# Patient Record
Sex: Male | Born: 1935 | Race: White | Hispanic: No | Smoking: Former smoker
Health system: Southern US, Community
[De-identification: ages and names within clinical notes are randomized; demographics above are authoritative.]

## PROBLEM LIST (undated history)

## (undated) DIAGNOSIS — I4891 Unspecified atrial fibrillation: Secondary | ICD-10-CM

## (undated) DIAGNOSIS — Z87891 Personal history of nicotine dependence: Secondary | ICD-10-CM

## (undated) DIAGNOSIS — F32A Depression, unspecified: Secondary | ICD-10-CM

## (undated) DIAGNOSIS — F0390 Unspecified dementia without behavioral disturbance: Secondary | ICD-10-CM

## (undated) DIAGNOSIS — Z87768 Personal history of other specified (corrected) congenital malformations of integument, limbs and musculoskeletal system: Secondary | ICD-10-CM

## (undated) DIAGNOSIS — F329 Major depressive disorder, single episode, unspecified: Secondary | ICD-10-CM

## (undated) DIAGNOSIS — Z8776 Personal history of (corrected) congenital malformations of integument, limbs and musculoskeletal system: Secondary | ICD-10-CM

## (undated) DIAGNOSIS — N400 Enlarged prostate without lower urinary tract symptoms: Secondary | ICD-10-CM

## (undated) DIAGNOSIS — Z66 Do not resuscitate: Secondary | ICD-10-CM

## (undated) DIAGNOSIS — I2510 Atherosclerotic heart disease of native coronary artery without angina pectoris: Secondary | ICD-10-CM

## (undated) DIAGNOSIS — E538 Deficiency of other specified B group vitamins: Secondary | ICD-10-CM

## (undated) DIAGNOSIS — S12100A Unspecified displaced fracture of second cervical vertebra, initial encounter for closed fracture: Secondary | ICD-10-CM

## (undated) DIAGNOSIS — I10 Essential (primary) hypertension: Secondary | ICD-10-CM

## (undated) DIAGNOSIS — I619 Nontraumatic intracerebral hemorrhage, unspecified: Secondary | ICD-10-CM

## (undated) HISTORY — DX: Personal history of (corrected) congenital malformations of integument, limbs and musculoskeletal system: Z87.76

## (undated) HISTORY — DX: Personal history of other specified (corrected) congenital malformations of integument, limbs and musculoskeletal system: Z87.768

## (undated) HISTORY — PX: CORONARY ARTERY BYPASS GRAFT: SHX141

---

## 2003-04-03 ENCOUNTER — Other Ambulatory Visit: Payer: Self-pay

## 2005-06-05 ENCOUNTER — Other Ambulatory Visit: Payer: Self-pay

## 2005-06-05 ENCOUNTER — Inpatient Hospital Stay: Payer: Self-pay | Admitting: Internal Medicine

## 2005-06-06 ENCOUNTER — Other Ambulatory Visit: Payer: Self-pay

## 2005-07-31 ENCOUNTER — Encounter: Payer: Self-pay | Admitting: Cardiovascular Disease

## 2005-08-26 ENCOUNTER — Encounter: Payer: Self-pay | Admitting: Cardiovascular Disease

## 2007-05-01 ENCOUNTER — Ambulatory Visit: Payer: Self-pay | Admitting: Cardiovascular Disease

## 2007-10-28 ENCOUNTER — Inpatient Hospital Stay: Payer: Self-pay | Admitting: Psychiatry

## 2007-10-28 ENCOUNTER — Other Ambulatory Visit: Payer: Self-pay

## 2007-11-27 ENCOUNTER — Inpatient Hospital Stay: Payer: Self-pay | Admitting: Psychiatry

## 2007-12-09 ENCOUNTER — Inpatient Hospital Stay: Payer: Self-pay | Admitting: Psychiatry

## 2007-12-18 ENCOUNTER — Other Ambulatory Visit: Payer: Self-pay

## 2008-06-13 ENCOUNTER — Inpatient Hospital Stay: Payer: Self-pay | Admitting: Internal Medicine

## 2009-04-28 ENCOUNTER — Emergency Department: Payer: Self-pay | Admitting: Internal Medicine

## 2011-09-05 ENCOUNTER — Inpatient Hospital Stay (HOSPITAL_COMMUNITY): Payer: Medicare Other

## 2011-09-05 ENCOUNTER — Encounter (HOSPITAL_COMMUNITY): Payer: Self-pay | Admitting: Emergency Medicine

## 2011-09-05 ENCOUNTER — Inpatient Hospital Stay (HOSPITAL_COMMUNITY)
Admission: EM | Admit: 2011-09-05 | Discharge: 2011-09-07 | DRG: 123 | Payer: Medicare Other | Attending: Internal Medicine | Admitting: Internal Medicine

## 2011-09-05 ENCOUNTER — Other Ambulatory Visit (HOSPITAL_COMMUNITY): Payer: Medicare Other

## 2011-09-05 DIAGNOSIS — I2510 Atherosclerotic heart disease of native coronary artery without angina pectoris: Secondary | ICD-10-CM | POA: Diagnosis present

## 2011-09-05 DIAGNOSIS — G459 Transient cerebral ischemic attack, unspecified: Secondary | ICD-10-CM | POA: Diagnosis present

## 2011-09-05 DIAGNOSIS — H3400 Transient retinal artery occlusion, unspecified eye: Principal | ICD-10-CM | POA: Diagnosis present

## 2011-09-05 DIAGNOSIS — F0390 Unspecified dementia without behavioral disturbance: Secondary | ICD-10-CM | POA: Diagnosis present

## 2011-09-05 DIAGNOSIS — Z66 Do not resuscitate: Secondary | ICD-10-CM | POA: Diagnosis present

## 2011-09-05 DIAGNOSIS — R55 Syncope and collapse: Secondary | ICD-10-CM | POA: Diagnosis present

## 2011-09-05 DIAGNOSIS — I4891 Unspecified atrial fibrillation: Secondary | ICD-10-CM | POA: Diagnosis present

## 2011-09-05 DIAGNOSIS — Z951 Presence of aortocoronary bypass graft: Secondary | ICD-10-CM

## 2011-09-05 DIAGNOSIS — R531 Weakness: Secondary | ICD-10-CM

## 2011-09-05 DIAGNOSIS — R1310 Dysphagia, unspecified: Secondary | ICD-10-CM | POA: Diagnosis not present

## 2011-09-05 DIAGNOSIS — N400 Enlarged prostate without lower urinary tract symptoms: Secondary | ICD-10-CM | POA: Diagnosis present

## 2011-09-05 DIAGNOSIS — E119 Type 2 diabetes mellitus without complications: Secondary | ICD-10-CM | POA: Diagnosis present

## 2011-09-05 DIAGNOSIS — I10 Essential (primary) hypertension: Secondary | ICD-10-CM | POA: Diagnosis present

## 2011-09-05 HISTORY — DX: Major depressive disorder, single episode, unspecified: F32.9

## 2011-09-05 HISTORY — DX: Unspecified atrial fibrillation: I48.91

## 2011-09-05 HISTORY — DX: Atherosclerotic heart disease of native coronary artery without angina pectoris: I25.10

## 2011-09-05 HISTORY — DX: Essential (primary) hypertension: I10

## 2011-09-05 HISTORY — DX: Unspecified dementia, unspecified severity, without behavioral disturbance, psychotic disturbance, mood disturbance, and anxiety: F03.90

## 2011-09-05 HISTORY — DX: Personal history of nicotine dependence: Z87.891

## 2011-09-05 HISTORY — DX: Unspecified displaced fracture of second cervical vertebra, initial encounter for closed fracture: S12.100A

## 2011-09-05 HISTORY — DX: Nontraumatic intracerebral hemorrhage, unspecified: I61.9

## 2011-09-05 HISTORY — DX: Depression, unspecified: F32.A

## 2011-09-05 HISTORY — DX: Do not resuscitate: Z66

## 2011-09-05 HISTORY — DX: Deficiency of other specified B group vitamins: E53.8

## 2011-09-05 HISTORY — DX: Benign prostatic hyperplasia without lower urinary tract symptoms: N40.0

## 2011-09-05 LAB — TROPONIN I: Troponin I: <0.3 ng/mL (ref <0.30)

## 2011-09-05 LAB — DIFFERENTIAL
Basophils Absolute: 0.1 10*3/uL (ref 0.0–0.1)
Eosinophils Relative: 1 % (ref 0–5)
Lymphocytes Relative: 12 % (ref 12–46)
Neutro Abs: 11.4 10*3/uL — ABNORMAL HIGH (ref 1.7–7.7)

## 2011-09-05 LAB — COMPREHENSIVE METABOLIC PANEL
ALT: 22 U/L (ref 0–53)
AST: 17 U/L (ref 0–37)
Albumin: 3.4 g/dL — ABNORMAL LOW (ref 3.5–5.2)
CO2: 22 mEq/L (ref 19–32)
Calcium: 9.7 mg/dL (ref 8.4–10.5)
GFR calc non Af Amer: 85 mL/min — ABNORMAL LOW (ref >90)
Sodium: 133 mEq/L — ABNORMAL LOW (ref 135–145)

## 2011-09-05 LAB — URINE MICROSCOPIC-ADD ON

## 2011-09-05 LAB — URINALYSIS, ROUTINE W REFLEX MICROSCOPIC
Bilirubin Urine: NEGATIVE
Glucose, UA: >1000 mg/dL — AB
Hgb urine dipstick: NEGATIVE
Specific Gravity, Urine: 1.023 (ref 1.005–1.030)
Urobilinogen, UA: 0.2 mg/dL (ref 0.0–1.0)
pH: 5.5 (ref 5.0–8.0)

## 2011-09-05 LAB — CK TOTAL AND CKMB (NOT AT ARMC): Relative Index: INVALID (ref 0.0–2.5)

## 2011-09-05 LAB — GLUCOSE, CAPILLARY: Glucose-Capillary: 266 mg/dL — ABNORMAL HIGH (ref 70–99)

## 2011-09-05 LAB — CBC
MCV: 93.1 fL (ref 78.0–100.0)
Platelets: 213 10*3/uL (ref 150–400)
RDW: 14.3 % (ref 11.5–15.5)
WBC: 15.5 10*3/uL — ABNORMAL HIGH (ref 4.0–10.5)

## 2011-09-05 LAB — APTT: aPTT: 28 seconds (ref 24–37)

## 2011-09-05 MED ORDER — DONEPEZIL HCL 10 MG PO TABS
10.0000 mg | ORAL_TABLET | Freq: Every day | ORAL | Status: DC
  Administered 2011-09-06 (×2): 10 mg via ORAL
  Filled 2011-09-05 (×3): qty 1

## 2011-09-05 MED ORDER — LISINOPRIL 5 MG PO TABS
5.0000 mg | ORAL_TABLET | Freq: Every day | ORAL | Status: DC
  Administered 2011-09-06: 5 mg via ORAL
  Filled 2011-09-05 (×2): qty 1

## 2011-09-05 MED ORDER — SODIUM CHLORIDE 0.9 % IV SOLN
INTRAVENOUS | Status: DC
  Administered 2011-09-05: 21:00:00 via INTRAVENOUS

## 2011-09-05 MED ORDER — LORAZEPAM 1 MG PO TABS
ORAL_TABLET | ORAL | Status: AC
  Filled 2011-09-05: qty 1

## 2011-09-05 MED ORDER — ASPIRIN 300 MG RE SUPP: 300.0000 mg | Freq: Every day | RECTAL | Status: DC

## 2011-09-05 MED ORDER — LORAZEPAM 1 MG PO TABS
1.0000 mg | ORAL_TABLET | Freq: Once | ORAL | Status: AC
  Administered 2011-09-05: 16:00:00 via ORAL

## 2011-09-05 MED ORDER — PIPERACILLIN-TAZOBACTAM 3.375 G IVPB 30 MIN
3.3750 g | Freq: Once | INTRAVENOUS | Status: AC
  Administered 2011-09-05: 3.375 g via INTRAVENOUS
  Filled 2011-09-05: qty 50

## 2011-09-05 MED ORDER — GLIPIZIDE 5 MG PO TABS
5.0000 mg | ORAL_TABLET | Freq: Every day | ORAL | Status: DC
  Administered 2011-09-06: 5 mg via ORAL
  Filled 2011-09-05 (×3): qty 1

## 2011-09-05 MED ORDER — LORAZEPAM 1 MG PO TABS
1.0000 mg | ORAL_TABLET | Freq: Once | ORAL | Status: AC
  Administered 2011-09-05: 15:00:00 via ORAL

## 2011-09-05 MED ORDER — PIPERACILLIN-TAZOBACTAM 3.375 G IVPB
3.3750 g | Freq: Three times a day (TID) | INTRAVENOUS | Status: DC
  Administered 2011-09-06 – 2011-09-07 (×4): 3.375 g via INTRAVENOUS
  Filled 2011-09-05 (×7): qty 50

## 2011-09-05 MED ORDER — LORAZEPAM 1 MG PO TABS
1.0000 mg | ORAL_TABLET | Freq: Once | ORAL | Status: DC
  Filled 2011-09-05: qty 1

## 2011-09-05 MED ORDER — VITAMIN B-12 1000 MCG PO TABS
1000.0000 ug | ORAL_TABLET | Freq: Every day | ORAL | Status: DC
  Administered 2011-09-06: 1000 ug via ORAL
  Filled 2011-09-05 (×2): qty 1

## 2011-09-05 MED ORDER — SERTRALINE HCL 25 MG PO TABS
25.0000 mg | ORAL_TABLET | Freq: Every day | ORAL | Status: DC
  Administered 2011-09-06 (×2): 25 mg via ORAL
  Filled 2011-09-05 (×3): qty 1

## 2011-09-05 MED ORDER — INSULIN ASPART 100 UNIT/ML ~~LOC~~ SOLN
0.0000 [IU] | Freq: Three times a day (TID) | SUBCUTANEOUS | Status: DC
  Administered 2011-09-06: 2 [IU] via SUBCUTANEOUS

## 2011-09-05 MED ORDER — ACETAMINOPHEN 325 MG PO TABS
650.0000 mg | ORAL_TABLET | ORAL | Status: DC | PRN
  Administered 2011-09-06: 650 mg via ORAL
  Filled 2011-09-05: qty 2

## 2011-09-05 MED ORDER — LORAZEPAM 2 MG/ML IJ SOLN: 1.0000 mg | Freq: Once | INTRAMUSCULAR | Status: DC

## 2011-09-05 MED ORDER — DOXAZOSIN MESYLATE 2 MG PO TABS
2.0000 mg | ORAL_TABLET | Freq: Every day | ORAL | Status: DC
  Administered 2011-09-06: 2 mg via ORAL
  Filled 2011-09-05 (×2): qty 1

## 2011-09-05 MED ORDER — INSULIN ASPART 100 UNIT/ML ~~LOC~~ SOLN: 0.0000 [IU] | Freq: Every day | SUBCUTANEOUS | Status: DC

## 2011-09-05 NOTE — ED Notes (Signed)
Per patient's facility [facility] RN- state appointed guardian is Tonia Ghent [phone].

## 2011-09-05 NOTE — H&P (Addendum)
PCP:   Florentina Jenny, MD, MD   Chief Complaint:  Presyncope/disphagia  HPI: This is a 77 year male nursing facility resident with known history of dementia, BPH, HTN, depression, DM 2, CAD, atrial fibrillation, previous cerebral hemorrhage, vitamin B12 deficiency and previous C2 fracture, brought to ED, reportedly, because he had experienced dizziness and lightheadedness on standing up, followed by transient loss of vision today, then later complained of difficulty swallowing. He was brought to [facility] as a code stroke which was canceled by Dr Lyman Speller, neurologist, due to resolution of symptoms. Hospitalist service has been requested to admit patient. Reportedly, patient was uncorporative with attempts to obtain imaging studies, despite sedation.    Allergies:  No Known Allergies    Past Medical History  Diagnosis Date  . Dementia   . DNR (do not resuscitate)   . Cerebral hemorrhage   . Hypertension   . Depression   . Diabetes mellitus   . BPH (benign prostatic hypertrophy)   . Coronary artery disease   . Atrial fibrillation   . Vitamin B 12 deficiency   . C2 cervical fracture     Past Surgical History  Procedure Date  . Coronary artery bypass graft     Prior to Admission medications   Medication Sig Start Date End Date Taking? Authorizing Provider  donepezil (ARICEPT) 10 MG tablet Take 10 mg by mouth at bedtime.   Yes Historical Provider, MD  doxazosin (CARDURA) 2 MG tablet Take 2 mg by mouth daily.   Yes Historical Provider, MD  glipiZIDE (GLUCOTROL) 5 MG tablet Take 5 mg by mouth daily.   Yes Historical Provider, MD  lisinopril (PRINIVIL,ZESTRIL) 5 MG tablet Take 5 mg by mouth daily.   Yes Historical Provider, MD  oxyCODONE-acetaminophen (PERCOCET) 5-325 MG per tablet Take 1 tablet by mouth every 8 (eight) hours as needed. For pain   Yes Historical Provider, MD  ranitidine (ZANTAC) 150 MG capsule Take 150 mg by mouth daily.   Yes Historical Provider, MD    sertraline (ZOLOFT) 25 MG tablet Take 25 mg by mouth at bedtime.   Yes Historical Provider, MD  vitamin B-12 (CYANOCOBALAMIN) 1000 MCG tablet Take 1,000 mcg by mouth daily.   Yes Historical Provider, MD    Social History:  has an unknown smoking status. He does not have any smokeless tobacco history on file. He reports that he does not drink alcohol or use illicit drugs.  No family history on file.  Review of Systems:  As per HPI and chief complaint. Patent denies fatigue, diminished appetite, weight loss, fever, chills, headache, chest pain, cough, shortness of breath, abdominal pain, vomiting, diarrhea. The rest of the systems review is negative, within the limits imposed by patient's dementia.  Physical Exam: General:  Patient does not appear to be in obvious acute distress. Alert, communicative, talking in complete sentences, not short of breath at rest.  HEENT:  No clinical pallor, no jaundice, no conjunctival injection or discharge. Hydration status is fair. NECK:  Supple, JVP not seen, no carotid bruits, no palpable lymphadenopathy, no palpable goiter. CHEST:  Clinically clear to auscultation, no wheezes, no crackles. HEART:  Sounds 1 and 2 heard, normal, regular, no murmurs. ABDOMEN:  Full, soft, no scars, non-tender, no palpable organomegaly, no palpable masses, normal bowel sounds. GENITALIA:  Not examined. LOWER EXTREMITIES:  No pitting edema, palpable peripheral pulses. MUSCULOSKELETAL SYSTEM:  Generalized osteoarthritic changes, otherwise, normal. CENTRAL NERVOUS SYSTEM:  No focal neurologic deficit on gross examination.  Labs on  Admission:  Results for orders placed during the hospital encounter of 09/05/11 (from the past 48 hour(s))  PROTIME-INR     Status: Normal   Collection Time   09/05/11  3:25 PM      Component Value Range Comment   Prothrombin Time 13.7  11.6 - 15.2 (seconds)    INR 1.03  0.00 - 1.49    APTT     Status: Normal   Collection Time   09/05/11  3:25  PM      Component Value Range Comment   aPTT 28  24 - 37 (seconds)   CBC     Status: Abnormal   Collection Time   09/05/11  3:25 PM      Component Value Range Comment   WBC 15.5 (*) 4.0 - 10.5 (K/uL)    RBC 4.67  4.22 - 5.81 (MIL/uL)    Hemoglobin 14.9  13.0 - 17.0 (g/dL)    HCT 16.1  09.6 - 04.5 (%)    MCV 93.1  78.0 - 100.0 (fL)    MCH 31.9  26.0 - 34.0 (pg)    MCHC 34.3  30.0 - 36.0 (g/dL)    RDW 40.9  81.1 - 91.4 (%)    Platelets 213  150 - 400 (K/uL)   DIFFERENTIAL     Status: Abnormal   Collection Time   09/05/11  3:25 PM      Component Value Range Comment   Neutrophils Relative 74  43 - 77 (%)    Neutro Abs 11.4 (*) 1.7 - 7.7 (K/uL)    Lymphocytes Relative 12  12 - 46 (%)    Lymphs Abs 1.8  0.7 - 4.0 (K/uL)    Monocytes Relative 13 (*) 3 - 12 (%)    Monocytes Absolute 2.0 (*) 0.1 - 1.0 (K/uL)    Eosinophils Relative 1  0 - 5 (%)    Eosinophils Absolute 0.2  0.0 - 0.7 (K/uL)    Basophils Relative 1  0 - 1 (%)    Basophils Absolute 0.1  0.0 - 0.1 (K/uL)   COMPREHENSIVE METABOLIC PANEL     Status: Abnormal   Collection Time   09/05/11  3:25 PM      Component Value Range Comment   Sodium 133 (*) 135 - 145 (mEq/L)    Potassium 4.1  3.5 - 5.1 (mEq/L)    Chloride 98  96 - 112 (mEq/L)    CO2 22  19 - 32 (mEq/L)    Glucose, Bld 215 (*) 70 - 99 (mg/dL)    BUN 16  6 - 23 (mg/dL)    Creatinine, Ser 7.82  0.50 - 1.35 (mg/dL)    Calcium 9.7  8.4 - 10.5 (mg/dL)    Total Protein 6.7  6.0 - 8.3 (g/dL)    Albumin 3.4 (*) 3.5 - 5.2 (g/dL)    AST 17  0 - 37 (U/L)    ALT 22  0 - 53 (U/L)    Alkaline Phosphatase 91  39 - 117 (U/L)    Total Bilirubin 0.3  0.3 - 1.2 (mg/dL)    GFR calc non Af Amer 85 (*) >90 (mL/min)    GFR calc Af Amer >90  >90 (mL/min)   CK TOTAL AND CKMB     Status: Normal   Collection Time   09/05/11  3:25 PM      Component Value Range Comment   Total CK 65  7 - 232 (U/L)    CK, MB 3.0  0.3 - 4.0 (ng/mL)    Relative Index RELATIVE INDEX IS INVALID  0.0 - 2.5      TROPONIN I     Status: Normal   Collection Time   09/05/11  3:25 PM      Component Value Range Comment   Troponin I <0.30  <0.30 (ng/mL)   URINALYSIS, ROUTINE W REFLEX MICROSCOPIC     Status: Abnormal   Collection Time   09/05/11  4:27 PM      Component Value Range Comment   Color, Urine YELLOW  YELLOW     APPearance CLEAR  CLEAR     Specific Gravity, Urine 1.023  1.005 - 1.030     pH 5.5  5.0 - 8.0     Glucose, UA >1000 (*) NEGATIVE (mg/dL)    Hgb urine dipstick NEGATIVE  NEGATIVE     Bilirubin Urine NEGATIVE  NEGATIVE     Ketones, ur NEGATIVE  NEGATIVE (mg/dL)    Protein, ur NEGATIVE  NEGATIVE (mg/dL)    Urobilinogen, UA 0.2  0.0 - 1.0 (mg/dL)    Nitrite NEGATIVE  NEGATIVE     Leukocytes, UA NEGATIVE  NEGATIVE    URINE MICROSCOPIC-ADD ON     Status: Normal   Collection Time   09/05/11  4:27 PM      Component Value Range Comment   Squamous Epithelial / LPF RARE  RARE     RBC / HPF 0-2  <3 (RBC/hpf)     Radiological Exams on Admission: No results found.  Assessment/Plan Principal Problem:  *Pre-syncope: This occurred on standing today, without loss of consiousness or fall. We shall check orthostatics, and review medication. Meanwhile, place patient in iv normal saline. Active Problems: 1. TIA: Patient had transient visual loss, which may have been an episode of amaurosis fugax. At the time of this evaluation, symptoms had resolved. Certainly. Patient has risk factors, given his known history of atrial fibrillation. Unfortunately, he is not a candidate for anticoagulation, given a history of cerebral hemorrhage. We shall commence low dose ASA, once brain imaging studies obtained..   2. Dysphagia: Patient appeared to have had difficulty swallowing food. There has been no recurrence since. We shall request swallow evaluation, and if negative, consider barium swallow, to rule out schatzki ring, or other local lesion.  3. HTN (hypertension): BP appears reasonably controlled at this  time.  4. DM (diabetes mellitus): This is uncontrolled, based on random blood glucose. We shall check HBA1C, and manage with diet and SSI, as well as oral hypoglycemics.  5. Atrial fibrillation: rate-controlled. Not an anticoagulation candidate, for reasons given above.  6. CAD (coronary artery disease): Stable/asymptomatic.  7. BPH (benign prostatic hyperplasia): Not problematic.  8. Dementia: Stable. We shall continue Aricept.  9. Leukocytosis. Source unclear at this time. Urinalysis is negative, and CXR has not yet been obtained. Patient does not look septic. Will place on empiric Zosyn, in case pf aspiration, and await CXR. Will also, send off blood cultures.  Further management will depend on clinical course.  Time Spent on Admission: 1 hour.  Yvonnie Schinke,CHRISTOPHER 09/05/2011, 7:53 PM

## 2011-09-05 NOTE — ED Notes (Signed)
Pt. Upon arrival to ED, has refused IV, CT scan, changing into a gown.  Dr. Clarene Duke is at bedside, speaking with pt.   Pt. Is alert and oriented X3, answers questions appropriately

## 2011-09-05 NOTE — Progress Notes (Signed)
ANTIBIOTIC CONSULT NOTE - INITIAL  Pharmacy Consult for zosyn Indication: empiric  No Known Allergies  Vital Signs: Temp: 97.9 F (36.6 C) (04/10 1934) Temp src: Oral (04/10 1934) BP: 113/61 mmHg (04/10 1934) Pulse Rate: 90  (04/10 1934) Intake/Output from previous day:   Intake/Output from this shift:    Labs:  Basename 09/05/11 1525  WBC 15.5*  HGB 14.9  PLT 213  LABCREA --  CREATININE 0.80   CrCl is unknown because there is no height on file for the current visit. No results found for this basename: VANCOTROUGH:2,VANCOPEAK:2,VANCORANDOM:2,GENTTROUGH:2,GENTPEAK:2,GENTRANDOM:2,TOBRATROUGH:2,TOBRAPEAK:2,TOBRARND:2,AMIKACINPEAK:2,AMIKACINTROU:2,AMIKACIN:2, in the last 72 hours   Microbiology: No results found for this or any previous visit (from the past 720 hour(s)).  Medical History: Past Medical History  Diagnosis Date  . Dementia   . DNR (do not resuscitate)   . Cerebral hemorrhage   . Hypertension   . Depression   . Diabetes mellitus   . BPH (benign prostatic hypertrophy)   . Coronary artery disease   . Atrial fibrillation   . Vitamin B 12 deficiency   . C2 cervical fracture    Assessment: 77 yom initially brought to the ED from a nursing home as a Code Stroke. This was cancelled due to resolution of symptoms. Pt is afebrile but WBC is elevated. To begin empiric zosyn for unclear source of infection.   Goal of Therapy:  Resolution of infection  Plan:  Follow up culture results Zosyn 3.375gm IV Q8H (4 hour infusion)  Haleigh Desmith, Drake Leach 09/05/2011,8:38 PM

## 2011-09-05 NOTE — ED Provider Notes (Signed)
Pt presents after getting dizzy on standing today. States he has had this before. Pt states afterwards he was unable to eat lunch which was beef tips. States he was unable to swallow. States he has never had that before.   Pt has refused to have CT scan done. He has been given ativan x2 mg for sedation but he is still awake. Pt unable to get geodan or haldol b/o prolonged QTIc. Pt is DNR.  Results for orders placed during the hospital encounter of 09/05/11  URINALYSIS, ROUTINE W REFLEX MICROSCOPIC      Component Value Range   Color, Urine YELLOW  YELLOW    APPearance CLEAR  CLEAR    Specific Gravity, Urine 1.023  1.005 - 1.030    pH 5.5  5.0 - 8.0    Glucose, UA >1000 (*) NEGATIVE (mg/dL)   Hgb urine dipstick NEGATIVE  NEGATIVE    Bilirubin Urine NEGATIVE  NEGATIVE    Ketones, ur NEGATIVE  NEGATIVE (mg/dL)   Protein, ur NEGATIVE  NEGATIVE (mg/dL)   Urobilinogen, UA 0.2  0.0 - 1.0 (mg/dL)   Nitrite NEGATIVE  NEGATIVE    Leukocytes, UA NEGATIVE  NEGATIVE   PROTIME-INR      Component Value Range   Prothrombin Time 13.7  11.6 - 15.2 (seconds)   INR 1.03  0.00 - 1.49   APTT      Component Value Range   aPTT 28  24 - 37 (seconds)  CBC      Component Value Range   WBC 15.5 (*) 4.0 - 10.5 (K/uL)   RBC 4.67  4.22 - 5.81 (MIL/uL)   Hemoglobin 14.9  13.0 - 17.0 (g/dL)   HCT 96.0  45.4 - 09.8 (%)   MCV 93.1  78.0 - 100.0 (fL)   MCH 31.9  26.0 - 34.0 (pg)   MCHC 34.3  30.0 - 36.0 (g/dL)   RDW 11.9  14.7 - 82.9 (%)   Platelets 213  150 - 400 (K/uL)  DIFFERENTIAL      Component Value Range   Neutrophils Relative 74  43 - 77 (%)   Neutro Abs 11.4 (*) 1.7 - 7.7 (K/uL)   Lymphocytes Relative 12  12 - 46 (%)   Lymphs Abs 1.8  0.7 - 4.0 (K/uL)   Monocytes Relative 13 (*) 3 - 12 (%)   Monocytes Absolute 2.0 (*) 0.1 - 1.0 (K/uL)   Eosinophils Relative 1  0 - 5 (%)   Eosinophils Absolute 0.2  0.0 - 0.7 (K/uL)   Basophils Relative 1  0 - 1 (%)   Basophils Absolute 0.1  0.0 - 0.1 (K/uL)    COMPREHENSIVE METABOLIC PANEL      Component Value Range   Sodium 133 (*) 135 - 145 (mEq/L)   Potassium 4.1  3.5 - 5.1 (mEq/L)   Chloride 98  96 - 112 (mEq/L)   CO2 22  19 - 32 (mEq/L)   Glucose, Bld 215 (*) 70 - 99 (mg/dL)   BUN 16  6 - 23 (mg/dL)   Creatinine, Ser 5.62  0.50 - 1.35 (mg/dL)   Calcium 9.7  8.4 - 13.0 (mg/dL)   Total Protein 6.7  6.0 - 8.3 (g/dL)   Albumin 3.4 (*) 3.5 - 5.2 (g/dL)   AST 17  0 - 37 (U/L)   ALT 22  0 - 53 (U/L)   Alkaline Phosphatase 91  39 - 117 (U/L)   Total Bilirubin 0.3  0.3 - 1.2 (mg/dL)   GFR  calc non Af Amer 85 (*) >90 (mL/min)   GFR calc Af Amer >90  >90 (mL/min)  CK TOTAL AND CKMB      Component Value Range   Total CK 65  7 - 232 (U/L)   CK, MB 3.0  0.3 - 4.0 (ng/mL)   Relative Index RELATIVE INDEX IS INVALID  0.0 - 2.5   TROPONIN I      Component Value Range   Troponin I <0.30  <0.30 (ng/mL)  URINE MICROSCOPIC-ADD ON      Component Value Range   Squamous Epithelial / LPF RARE  RARE    RBC / HPF 0-2  <3 (RBC/hpf)   Laboratory interpretation all normal except leukocytosis  No results found.  Patient was evaluated by neurology who recommended admission for further stroke evaluation.  Patient is a ward of the state, as far as I am aware they have not been able to reach a power of attorney for him yet this afternoon.  18:59 Dr Brien Few states patient has not been thoroughly evaluated. I've explained to him he cannot get some  sedative agents because of his prolonged QT I corrected which is 527. Patient has a CBC, the med and urinalysis which should be adequate to get patient admitted. Dr. Selena Batten can decide what type of sedation he wants to get the patient that would be safe to finish his stroke evaluation.  Diagnoses that have been ruled out:  None  Diagnoses that are still under consideration:  None  Final diagnoses:  Weakness   Plan per Dr Brien Few   Devoria Albe, MD, Franz Dell, MD 09/05/11 1902

## 2011-09-05 NOTE — Code Documentation (Signed)
77yo male who had sudden onset of blurred vision at 25. EMS was called and they activated code stroke at 1336. Pt arrived at 1348 and was met by the EDP. Stroke team arrived at 1342. Pt was hyperventilating on arrival, and gave history of recently "breaking his neck about 5 days ago" when he was in jail.  He supposedly was told if it did not heal right he could be paralyzed. His blurred vision cleared enroute at 1335.  He refuses CT, IV, etc. Code stroke was canceled by EDP.

## 2011-09-05 NOTE — ED Notes (Signed)
Pt continues to refuse radiographs or blood culture draws - Dr. Onalee Hua made aware and advises to continue to administer antibiotics. Orders will not be discontinued at this time for radiographs or lab work.

## 2011-09-05 NOTE — Progress Notes (Signed)
Echocardiogram 2D Echocardiogram was attempted but the patient refused.  Jorje Guild [facility] 09/05/2011, 3:33 PM

## 2011-09-05 NOTE — ED Notes (Signed)
Dinner tray ordered. HH carb mod med diet.

## 2011-09-05 NOTE — Progress Notes (Signed)
Bilateral carotid artery duplex attempted but the patient refused at 16:10.

## 2011-09-05 NOTE — ED Provider Notes (Signed)
History     CSN: [id number]  Arrival date & time 09/05/11  1349   First MD Initiated Contact with Patient 09/05/11 1357      Chief Complaint  Patient presents with  . Weakness    The history is provided by the EMS personnel and the nursing home. History Limited By: hx dementia   Pt was seen at 1355.  Per EMS and NH report, PTA pt was being assisted to standing position when he c/o feeling "dizzy" and having "blurry vision."  Pt then told NH staff he "couldn't see" and had "no vision."  EMS arrived to ED as possible Code Stroke but it was cancelled on arrival due to complete resolution of symptoms.  Pt is poor historian, hx dementia.    Past Medical History  Diagnosis Date  . Dementia   . DNR (do not resuscitate)   . Cerebral hemorrhage   . Hypertension   . Depression   . Diabetes mellitus   . BPH (benign prostatic hypertrophy)   . Coronary artery disease   . Atrial fibrillation   . Vitamin B 12 deficiency   . C2 cervical fracture     Past Surgical History  Procedure Date  . Coronary artery bypass graft     History  Substance Use Topics  . Smoking status: Unknown If Ever Smoked  . Smokeless tobacco: Not on file  . Alcohol Use: No    Review of Systems  Unable to perform ROS: Other    Allergies  Review of patient's allergies indicates no known allergies.  Home Medications   Current Outpatient Rx  Name Route Sig Dispense Refill  . DONEPEZIL HCL 10 MG PO TABS Oral Take 10 mg by mouth at bedtime.    Marland Kitchen DOXAZOSIN MESYLATE 2 MG PO TABS Oral Take 2 mg by mouth daily.    Marland Kitchen GLIPIZIDE 5 MG PO TABS Oral Take 5 mg by mouth daily.    Marland Kitchen LISINOPRIL 5 MG PO TABS Oral Take 5 mg by mouth daily.    . OXYCODONE-ACETAMINOPHEN 5-325 MG PO TABS Oral Take 1 tablet by mouth every 8 (eight) hours as needed. For pain    . RANITIDINE HCL 150 MG PO CAPS Oral Take 150 mg by mouth daily.    . SERTRALINE HCL 25 MG PO TABS Oral Take 25 mg by mouth at bedtime.    Marland Kitchen VITAMIN B-12 1000 MCG PO  TABS Oral Take 1,000 mcg by mouth daily.      BP 179/87  Pulse 91  Temp(Src) 98.8 F (37.1 C) (Oral)  Resp 24  SpO2 99%  Physical Exam 1400: Physical examination:  Nursing notes reviewed; Vital signs and O2 SAT reviewed;  Constitutional: Well developed, Well nourished, In no acute distress; Head:  Normocephalic, atraumatic; Eyes: EOMI, PERRL, No scleral icterus; ENMT: Mouth and pharynx normal, Mucous membranes dry; Neck: Supple, Full range of motion, No lymphadenopathy; Cardiovascular: Regular rate and rhythm, No murmur, rub, or gallop; Respiratory: Breath sounds clear & equal bilaterally, No rales, rhonchi, wheezes, or rub, Normal respiratory effort/excursion; Chest: Nontender, Movement normal; Abdomen: Soft, Nontender, Nondistended, Normal bowel sounds; Extremities: Pulses normal, No tenderness, No edema, No calf edema or asymmetry.; Neuro: Awake, alert, confused re: events, place, time.  Speech clear, no facial droop, normal coordination, strength 5/5 equal bilat UE's and LE's, equal grips bilat.; Skin: Color normal, Warm, Dry, no rash.; Psych:  Agitated and argumentative.   ED Course  Procedures   1630:  Pt has been given ativan  PO x1, with only mild improvement in agitation.  Will give 2nd dose PO.  Pt refusing "everything" but was finally agreeable to labs draw after 1st ativan.  Neuro MD has eval pt in ED, rec medicine admit for possible TIA.  Pt has hx of dementia, ED RN is still trying to get a hold of pt's guardian/POA.  Sign out to Dr. Lynelle Doctor.       MDM  MDM Reviewed: nursing note and vitals Interpretation: ECG      Date: 09/05/2011  Rate: 93  Rhythm: normal sinus rhythm  QRS Axis: left  Intervals: PR prolonged and QT prolonged; QTc 527  ST/T Wave abnormalities: normal  Conduction Disutrbances:first-degree A-V block , right bundle branch block and left posterior fascicular block  Narrative Interpretation:   Old EKG Reviewed: none  available.             Laray Anger, DO 09/06/11 [phone]

## 2011-09-05 NOTE — ED Notes (Signed)
CODE STROKE canceled on arrival to department by EMS. EMS called out for sudden onset of dizziness and bilateral blurred vision. CBG 266. On arrival to ED EMS states blurred vision resolved. Pt is refusing lab work or CT.

## 2011-09-05 NOTE — Consult Note (Signed)
[facility] CONSULT NOTE     Reason for Consult: stroke    HPI:    Dennis Meadows is an 77 y.o. male with history A-fib who was at home today when he was helped up to a standing position by home health nurse.  After he stood up he noted blurred vision that " became lack of vision".  He was brought to [facility] as a code stroke which was canceled due to resolution of symptoms. At present time he is back to his baseline.    Past Medical History  Diagnosis Date  . Dementia   . DNR (do not resuscitate)   . Cerebral hemorrhage   . Hypertension   . Depression   . Diabetes mellitus   . BPH (benign prostatic hypertrophy)   . Coronary artery disease   . Atrial fibrillation   . Vitamin B 12 deficiency   . C2 cervical fracture     Past Surgical History  Procedure Date  . Coronary artery bypass graft     No family history on file.  Social History:  has an unknown smoking status. He does not have any smokeless tobacco history on file. He reports that he does not drink alcohol or use illicit drugs.  No Known Allergies  Medications:    Scheduled:   . LORazepam  1 mg Intravenous Once    Review of Systems - General ROS: negative for - chills, fatigue, fever or hot flashes Hematological and Lymphatic ROS: negative for - bruising, fatigue, jaundice or pallor Endocrine ROS: negative for - hair pattern changes, hot flashes, mood swings or skin changes Respiratory ROS: negative for - cough, hemoptysis, orthopnea or wheezing Cardiovascular ROS: negative for - dyspnea on exertion, orthopnea, palpitations or shortness of breath Gastrointestinal ROS: negative for - abdominal pain, appetite loss, blood in stools, diarrhea or hematemesis Musculoskeletal ROS: negative for - joint pain, joint stiffness, joint swelling or muscle pain Neurological ROS: See HPI Dermatological ROS: negative for dry skin, pruritus and rash   Blood pressure 179/87, pulse  91, temperature 98.8 F (37.1 C), temperature source Oral, resp. rate 24, SpO2 99.00%.   Neurologic Examination:   Mental Status: Alert, oriented, thought content appropriate.  Speech fluent without evidence of aphasia. Able to follow 3 step commands without difficulty. Cranial Nerves: II-Visual fields grossly intact. III/IV/VI-Extraocular movements intact.  Pupils reactive bilaterally. V/VII-Smile symmetric VIII-grossly intact IX/X-normal gag XI-bilateral shoulder shrug XII-midline tongue extension Motor: 5/5 bilaterally with normal tone and bulk Sensory: Pinprick and light touch intact throughout, bilaterally Deep Tendon Reflexes: 2+ and symmetric throughout Plantars: Downgoing bilaterally Cerebellar: Normal finger-to-nose, normal rapid alternating movements and normal heel-to-shin test.     No results found for this basename: cbc, bmp, coags, chol, tri, ldl, hga1c    No results found for this or any previous visit (from the past 48 hour(s)).  No results found.  EEG --NSR  Assessment/Plan:   77 YO male with transient decrease in vision.  Now back to baseline.  At present time he is refusing all testing.  Recommend: Stroke Risk Factors - atrial fibrillation and hypertension  Plan: 1. HgbA1c, fasting lipid panel 2. MRI of the brain without contrast 3. PT consult, OT consult,  4. Echocardiogram 5. Carotid dopplers 6. Prophylactic therapy-Antiplatelet med: Aspirin - dose 325 daily 7. Risk  Factor modification  I have discussed patient with Dr.  Bezov and he has seen the patient and agrees with the above mentioned.    Felicie Morn PA-C [facility] [phone]  09/05/2011, 2:42 PM

## 2011-09-06 ENCOUNTER — Inpatient Hospital Stay (HOSPITAL_COMMUNITY): Payer: Medicare Other

## 2011-09-06 ENCOUNTER — Encounter (HOSPITAL_COMMUNITY): Payer: Self-pay | Admitting: *Deleted

## 2011-09-06 LAB — LIPID PANEL
Cholesterol: 167 mg/dL (ref 0–200)
HDL: 31 mg/dL — ABNORMAL LOW (ref >39)
Total CHOL/HDL Ratio: 5.4 RATIO
Triglycerides: 165 mg/dL — ABNORMAL HIGH (ref <150)

## 2011-09-06 LAB — COMPREHENSIVE METABOLIC PANEL
ALT: 19 U/L (ref 0–53)
BUN: 14 mg/dL (ref 6–23)
CO2: 20 mEq/L (ref 19–32)
Calcium: 8.7 mg/dL (ref 8.4–10.5)
Creatinine, Ser: 0.67 mg/dL (ref 0.50–1.35)
GFR calc Af Amer: >90 mL/min (ref >90)
GFR calc non Af Amer: >90 mL/min (ref >90)
Glucose, Bld: 164 mg/dL — ABNORMAL HIGH (ref 70–99)
Sodium: 134 mEq/L — ABNORMAL LOW (ref 135–145)
Total Protein: 6.3 g/dL (ref 6.0–8.3)

## 2011-09-06 LAB — URINE CULTURE
Colony Count: NO GROWTH
Culture  Setup Time: 201304101655
Culture: NO GROWTH

## 2011-09-06 LAB — GLUCOSE, CAPILLARY
Glucose-Capillary: 183 mg/dL — ABNORMAL HIGH (ref 70–99)
Glucose-Capillary: 221 mg/dL — ABNORMAL HIGH (ref 70–99)

## 2011-09-06 LAB — HEMOGLOBIN A1C
Hgb A1c MFr Bld: 7.2 % — ABNORMAL HIGH (ref <5.7)
Hgb A1c MFr Bld: 6.8 % — ABNORMAL HIGH (ref <5.7)
Mean Plasma Glucose: 148 mg/dL — ABNORMAL HIGH (ref <117)

## 2011-09-06 LAB — CBC
Hemoglobin: 14.2 g/dL (ref 13.0–17.0)
MCH: 31.3 pg (ref 26.0–34.0)
MCHC: 33 g/dL (ref 30.0–36.0)
MCV: 94.7 fL (ref 78.0–100.0)
RBC: 4.54 MIL/uL (ref 4.22–5.81)

## 2011-09-06 MED ORDER — AMLODIPINE BESYLATE 5 MG PO TABS
5.0000 mg | ORAL_TABLET | Freq: Every day | ORAL | Status: DC
  Administered 2011-09-06: 5 mg via ORAL
  Filled 2011-09-06 (×2): qty 1

## 2011-09-06 MED ORDER — PNEUMOCOCCAL VAC POLYVALENT 25 MCG/0.5ML IJ INJ
0.5000 mL | INJECTION | INTRAMUSCULAR | Status: DC
  Filled 2011-09-06: qty 0.5

## 2011-09-06 MED ORDER — LORAZEPAM 2 MG/ML IJ SOLN: 1.0000 mg | Freq: Once | INTRAMUSCULAR | Status: AC | PRN

## 2011-09-06 MED ORDER — ASPIRIN 325 MG PO TABS
325.0000 mg | ORAL_TABLET | Freq: Every day | ORAL | Status: DC
  Administered 2011-09-06: 325 mg via ORAL
  Filled 2011-09-06 (×2): qty 1

## 2011-09-06 NOTE — Progress Notes (Signed)
Patient stated that he told the Doctor that he would do the MRI, but he has changed his mind and will not have it done. Will cont to encourage Patient to do test.

## 2011-09-06 NOTE — Consult Note (Signed)
Subjective: Patient is stable.   Objective: Vital signs in last 24 hours: Temp:  [97.9 F (36.6 C)-98.8 F (37.1 C)] 98.5 F (36.9 C) (04/11 0600) Pulse Rate:  [90-95] 95  (04/11 0600) Resp:  [20-24] 20  (04/11 0600) BP: (113-179)/(61-87) 116/78 mmHg (04/11 0600) SpO2:  [94 %-99 %] 96 % (04/11 0600) Weight:  [65.6 kg (144 lb 10 oz)] 65.6 kg (144 lb 10 oz) (04/10 1934)  Intake/Output this shift: Total I/O In: 240 [P.O.:240] Out: -  Nutritional status: Carb Control  Neurological exam: AAO*3. No aphasia.  Was able to tell me months of the year forwards and backwards correctly, exhibiting good attention span. Recall was 3 of 3 after 5 minutes. Followed complex commands. Cranial nerves: EOMI, PERRL. Visual fields were full. Sensation to V1 through V3 areas of the face was intact and symmetric throughout. There was no facial asymmetry. Hearing to finger rub was equal and symmetrical bilaterally. Shoulder shrug was 5/5 and symmetric bilaterally. Head rotation was 5/5 bilaterally. There was no dysarthria or palatal deviation. Motor: strength was 5/5 and symmetric throughout. Sensory: was intact throughout to light touch, pinprick. Coordination: finger-to-nose were intact and symmetric bilaterally. Reflexes: were 2+ in upper extremities and 1+ at the knees and 1+ at the ankles. Plantar response was downgoing bilaterally. Gait: deferred  Lab Results:  Basename 09/06/11 0630 09/05/11 1525  WBC 14.0* 15.5*  HGB 14.2 14.9  HCT 43.0 43.5  PLT 213 213  NA 134* 133*  K 3.6 4.1  CL 100 98  CO2 20 22  GLUCOSE 164* 215*  BUN 14 16  CREATININE 0.67 0.80  CALCIUM 8.7 9.7  LABA1C -- --   Lipid Panel  Basename 09/06/11 0630  CHOL 167  TRIG 165*  HDL 31*  CHOLHDL 5.4  VLDL 33  LDLCALC 540*   Medications: I have reviewed the patient's current medications.  Assessment/Plan: 77 years old man with episode of lightheadedness upon standing up quickly that resolved when patient lied down  again 1) Orthostatics 2) Syncope Work-up 3) Readjustment of blood pressure medications as needed 4) Call with questions   LOS: 1 day   Dennis Meadows

## 2011-09-06 NOTE — Progress Notes (Signed)
Utilization Review Completed.Dennis Meadows T4/03/2012   

## 2011-09-06 NOTE — Progress Notes (Signed)
Pt. Continues to refuse any Lab work or Radiology test. Pt also refused his SSI at 2200 on 09/05/11.

## 2011-09-06 NOTE — Progress Notes (Signed)
Subjective: No new issues. Eating/drinking with gusto. No recurrence of symptoms.  Objective: Vital signs in last 24 hours: Temp:  [97.9 F (36.6 C)-98.8 F (37.1 C)] 98.2 F (36.8 C) (04/11 0941) Pulse Rate:  [90-96] 96  (04/11 0941) Resp:  [20-24] 20  (04/11 0941) BP: (113-179)/(61-87) 150/81 mmHg (04/11 0941) SpO2:  [94 %-99 %] 96 % (04/11 0941) Weight:  [65.6 kg (144 lb 10 oz)] 65.6 kg (144 lb 10 oz) (04/10 1934) Weight change:     Intake/Output from previous day:   Total I/O In: 240 [P.O.:240] Out: -    Physical Exam: General: Patient does not appear to be in obvious acute distress. Alert, communicative, talking in complete sentences, not short of breath at rest.  HEENT: No clinical pallor, no jaundice, no conjunctival injection or discharge. Hydration status is fair.  NECK: Supple, JVP not seen, no carotid bruits, no palpable lymphadenopathy, no palpable goiter.  CHEST: Clinically clear to auscultation, no wheezes, no crackles.  HEART: Sounds 1 and 2 heard, normal, regular, no murmurs.  ABDOMEN: Full, soft, non-tender, no palpable organomegaly, no palpable masses, normal bowel sounds.  GENITALIA: Not examined.  LOWER EXTREMITIES: No pitting edema, palpable peripheral pulses.  MUSCULOSKELETAL SYSTEM: Generalized osteoarthritic changes, otherwise, normal.  CENTRAL NERVOUS SYSTEM: No focal neurologic deficit on gross examination.  Lab Results:  Basename 09/06/11 0630 09/05/11 1525  WBC 14.0* 15.5*  HGB 14.2 14.9  HCT 43.0 43.5  PLT 213 213    Basename 09/06/11 0630 09/05/11 1525  NA 134* 133*  K 3.6 4.1  CL 100 98  CO2 20 22  GLUCOSE 164* 215*  BUN 14 16  CREATININE 0.67 0.80  CALCIUM 8.7 9.7   No results found for this or any previous visit (from the past 240 hour(s)).   Studies/Results: No results found.  Medications: Scheduled Meds:   . aspirin  325 mg Oral Daily  . donepezil  10 mg Oral QHS  . doxazosin  2 mg Oral Daily  . glipiZIDE  5 mg Oral  Q breakfast  . insulin aspart  0-5 Units Subcutaneous QHS  . insulin aspart  0-9 Units Subcutaneous TID WC  . lisinopril  5 mg Oral Daily  . LORazepam  1 mg Oral Once  . LORazepam  1 mg Oral Once  . piperacillin-tazobactam  3.375 g Intravenous Once  . piperacillin-tazobactam (ZOSYN)  IV  3.375 g Intravenous Q8H  . pneumococcal 23 valent vaccine  0.5 mL Intramuscular Tomorrow-1000  . sertraline  25 mg Oral QHS  . vitamin B-12  1,000 mcg Oral Daily  . DISCONTD: aspirin  300 mg Rectal Daily  . DISCONTD: LORazepam  1 mg Intravenous Once  . DISCONTD: LORazepam  1 mg Oral Once   Continuous Infusions:   . sodium chloride 75 mL/hr at 09/05/11 2109   PRN Meds:.acetaminophen, LORazepam  Assessment/Plan: Principal Problem:  *Pre-syncope: This occurred on standing on 09/05/11, without loss of consiousness or fall. No postural drop on orthostatics so far. Hydration status seems satisfactory today. We shall discontinue iv fluids. Active Problems:  1. TIA: Patient had transient visual loss, which may have been an episode of amaurosis fugax. At the time of initial evaluation, symptoms had resolved. Certainly, patient has risk factors, given his known history of atrial fibrillation. Unfortunately, he is not a candidate for anticoagulation, given a history of cerebral hemorrhage. Low dose ASA has been commenced today. Brain imaging studies are still pending, and patient appears resistant to the idea of having them done.  Patient has remained asymptomatic so far. 2. Dysphagia: Patient appeared to have had difficulty swallowing food. There has been no recurrence since. And today, he has had a regular diet without any evidence of dysphagia. We shall cancel further GI evaluation. Perhaps, he did have a posterior circulation TIA. 3. HTN (hypertension): BP appears reasonably controlled at this time.  4. DM (diabetes mellitus): This was uncontrolled at presentation, based on random blood glucose. HBA1C is 7.2.  Managing with diet/SSI, as well as oral hypoglycemics, with improved control. 5. Atrial fibrillation: rate-controlled. Not an anticoagulation candidate, for reasons given above.  6. CAD (coronary artery disease): Stable/asymptomatic.  7. BPH (benign prostatic hyperplasia): Not problematic.  8. Dementia: Stable. We shall continue Aricept.  9. Leukocytosis. Source unclear at this time. Urinalysis is negative, and CXR is still pending. Patient does not look septic. Currently on empiric Zosyn day# 2, in case pf aspiration, and await CXR. Blood cultures are negative so far.   Comment: Awaiting PT/OT evaluation.    LOS: 1 day   Dennis Meadows,CHRISTOPHER 09/06/2011, 12:17 PM

## 2011-09-06 NOTE — Progress Notes (Signed)
Inpatient Diabetes Program Recommendations  AACE/ADA: New Consensus Statement on Inpatient Glycemic Control (2009)  Target Ranges:  Prepandial:   less than 140 mg/dL      Peak postprandial:   less than 180 mg/dL (1-2 hours)      Critically ill patients:  140 - 180 mg/dL   Reason for Visit: CBG's elevated last pm.  Improved this morning.    Inpatient Diabetes Program Recommendations HgbA1C: A1C=7.2% indicating farily good control prior to admit.  Note: Will follow.

## 2011-09-07 LAB — CBC
MCH: 31.4 pg (ref 26.0–34.0)
MCV: 92.7 fL (ref 78.0–100.0)
Platelets: 213 10*3/uL (ref 150–400)
RDW: 14.2 % (ref 11.5–15.5)

## 2011-09-07 LAB — BASIC METABOLIC PANEL
CO2: 22 mEq/L (ref 19–32)
Calcium: 8.9 mg/dL (ref 8.4–10.5)
Creatinine, Ser: 0.68 mg/dL (ref 0.50–1.35)
GFR calc Af Amer: >90 mL/min (ref >90)

## 2011-09-07 LAB — GLUCOSE, CAPILLARY: Glucose-Capillary: 144 mg/dL — ABNORMAL HIGH (ref 70–99)

## 2011-09-07 MED ORDER — LISINOPRIL 5 MG PO TABS: 10.0000 mg | ORAL_TABLET | Freq: Every day | ORAL | Status: DC

## 2011-09-07 MED ORDER — AMLODIPINE BESYLATE 5 MG PO TABS: 5.0000 mg | ORAL_TABLET | Freq: Every day | ORAL | Status: DC

## 2011-09-07 MED ORDER — OXYCODONE-ACETAMINOPHEN 5-325 MG PO TABS: 1.0000 | ORAL_TABLET | Freq: Three times a day (TID) | ORAL | Status: DC | PRN

## 2011-09-07 MED ORDER — ASPIRIN 325 MG PO TABS: 325.0000 mg | ORAL_TABLET | Freq: Every day | ORAL | Status: DC

## 2011-09-07 MED ORDER — MOXIFLOXACIN HCL 400 MG PO TABS: 400.0000 mg | ORAL_TABLET | Freq: Every day | ORAL | Status: AC

## 2011-09-07 NOTE — Discharge Summary (Addendum)
Physician Discharge Summary  Patient ID: Dennis Meadows MRN: [id number] DOB/AGE: [birth year] 77 y.o.  Admit date: 09/05/2011 Discharge date: 09/07/2011  Primary Care Physician:  Florentina Jenny, MD, MD   Discharge Diagnoses:    Patient Active Problem List  Diagnoses  . Pre-syncope  . Dysphagia  . HTN (hypertension)  . DM (diabetes mellitus)  . Atrial fibrillation  . CAD (coronary artery disease)  . BPH (benign prostatic hyperplasia)  . Dementia  . TIA (transient ischemic attack)    Medication List  As of 09/07/2011  1:44 PM   TAKE these medications         aspirin 325 MG tablet   Take 1 tablet (325 mg total) by mouth daily.      donepezil 10 MG tablet   Commonly known as: ARICEPT   Take 10 mg by mouth at bedtime.      doxazosin 2 MG tablet   Commonly known as: CARDURA   Take 2 mg by mouth daily.      glipiZIDE 5 MG tablet   Commonly known as: GLUCOTROL   Take 5 mg by mouth daily.      lisinopril 5 MG tablet   Commonly known as: PRINIVIL,ZESTRIL   Take 2 tablets (10 mg total) by mouth daily.      moxifloxacin 400 MG tablet   Commonly known as: AVELOX   Take 1 tablet (400 mg total) by mouth daily.      oxyCODONE-acetaminophen 5-325 MG per tablet   Commonly known as: PERCOCET   Take 1 tablet by mouth every 8 (eight) hours as needed. For pain      ranitidine 150 MG capsule   Commonly known as: ZANTAC   Take 150 mg by mouth daily.      sertraline 25 MG tablet   Commonly known as: ZOLOFT   Take 25 mg by mouth at bedtime.      vitamin B-12 1000 MCG tablet   Commonly known as: CYANOCOBALAMIN   Take 1,000 mcg by mouth daily.             Disposition and Follow-up:  Follow up with PMD.  Consults:  neurology  Dr Carmell Austria.  Significant Diagnostic Studies:  Dg Chest Port 1v Same Day  09/06/2011  *RADIOLOGY REPORT*  Clinical Data: Question aspiration.  PORTABLE CHEST - 1 VIEW SAME DAY  Comparison: None.  Findings: Cardiomegaly.  Prior CABG.  Low lung  volumes without confluent airspace opacity.  No effusions.  Mild peribronchial thickening.  IMPRESSION: Cardiomegaly.  Mild peribronchial thickening.  Low lung volumes.  Original Report Authenticated By: Cyndie Chime, M.D.    Brief H and P: For complete details, refer to admission H and P. However, in brief, this is a 77 year male with known history of dementia, BPH, HTN, depression, DM 2, CAD, atrial fibrillation, previous cerebral hemorrhage, vitamin B12 deficiency and previous C2 fracture, brought to ED, reportedly, because he had experienced dizziness and lightheadedness on standing up, followed by transient loss of vision today, then later complained of difficulty swallowing. He was brought to [facility] as a code stroke which was canceled by Dr Lyman Speller, neurologist, due to resolution of symptoms. He was admitted for further evaluation, investigation and management.  Physical Exam: On 09/07/11. General: Patient does not appear to be in obvious acute distress. Alert, communicative, talking in complete sentences, not short of breath at rest.  HEENT: No clinical pallor, no jaundice, no conjunctival injection or discharge. Hydration status is fair.  NECK: Supple, JVP not seen, no carotid bruits, no palpable lymphadenopathy, no palpable goiter.  CHEST: Clinically clear to auscultation, no wheezes, no crackles.  HEART: Sounds 1 and 2 heard, normal, regular, no murmurs.  ABDOMEN: Full, soft, non-tender, no palpable organomegaly, no palpable masses, normal bowel sounds.  GENITALIA: Not examined.  LOWER EXTREMITIES: No pitting edema, palpable peripheral pulses.  MUSCULOSKELETAL SYSTEM: Generalized osteoarthritic changes, otherwise, normal.  CENTRAL NERVOUS SYSTEM: No focal neurologic deficit on gross examination.   Hospital Course:  Principal Problem:  *Pre-syncope: This occurred on standing on 09/05/11, without loss of consiousness or fall. No postural drop on orthostatics was recorded,  hydration status appeared satisfactory and patient had no recurrence during this hospitalization.  Active Problems:  1. TIA: Patient had transient visual loss, which may have been an episode of amaurosis fugax. At the time of initial evaluation, symptoms had resolved. Certainly, patient has risk factors, given his known history of atrial fibrillation. Unfortunately, he is not a candidate for anticoagulation, given a prior history of cerebral hemorrhage. He has been placed on low dose ASA. Brain imaging studies or indeed vascular studies and 2D Echocardiogram could not be done, due to patient's persistent refusal. Patient has remained asymptomatic.  2. Dysphagia: Patient appeared to have had difficulty swallowing food, pre-admission. There has been no recurrence since, and he has managed a regular diet throughout his hospital stay, without any evidence of dysphagia. Further GI evaluation was therefore, not pursued. Perhaps, he did have a posterior circulation TIA.  3. HTN (hypertension): BP was controlled with adjustment in BP meds. Lisinopril dose has been increased to 10 mg daily..  4. DM (diabetes mellitus): This was uncontrolled at presentation, based on random blood glucose. HBA1C is 7.2.  He was managed with diet/SSI, as well as pre-admission oral hypoglycemics, with improved control.  5. Atrial fibrillation: This remained rate-controlled. Not an anticoagulation candidate, for reasons given above.  6. CAD (coronary artery disease): Stable/asymptomatic.  7. BPH (benign prostatic hyperplasia): Not problematic.  8. Dementia: Stable. Aricept was continued.  9. Leukocytosis. Patient had a leukocytosis of 15.5 at presentation. Source remains unclear at this time. Urinalysis is negative, and CXR was devoid of acute pathology, he remained apyrexial, and blood cultures showed no growth. He was managed empirically with Zosyn for presumed aspiration, which was not substantiated on chest X-ray. Patient does not  appear septic. Wcc was 13.6 on 09/07/11. He has been discharged on a further 5 days of Avelox..   Comment: stable for discharge on 09/07/11.  Time spent on Discharge: 45 mins.  Signed: Denica Web,CHRISTOPHER 09/07/2011, 1:44 PM

## 2011-09-07 NOTE — Progress Notes (Signed)
Clinical Social Work Department BRIEF PSYCHOSOCIAL ASSESSMENT 09/07/2011  Patient:  Dennis Meadows, Dennis Meadows     Account Number:  [id number]     Admit date:  09/05/2011  Clinical Social Worker:  Dennison Bulla  Date/Time:  09/07/2011 10:15 AM  Referred by:  Physician  Date Referred:  09/07/2011 Referred for  ALF Placement   Other Referral:   Interview type:  Patient Other interview type:    PSYCHOSOCIAL DATA Living Status:  FACILITY Admitted from facility:  [facility] Level of care:  Assisted Living Primary support name:  Mr. Gerre Couch Primary support relationship to patient:  NONE Degree of support available:   Guardian [phone]    CURRENT CONCERNS Current Concerns  Post-Acute Placement   Other Concerns:    SOCIAL WORK ASSESSMENT / PLAN CSW received referral due to patient being admitted from [facility]. CSW met with patient at bedside who was sleepy and disengaged with assessment. Patient did report he has lived at ALF for a long time and wishes to return at dc. CSW reviewed chart which stated PT/OT evaluation was ordered to determine level of care needed. CSW spoke with RN who stated no family present. CSW called ALF who stated patient could return at dc. ALF reports no family involvement and that patient has a guardian through [facility]. ALF gave CSW information. CSW called guardian and left a message. CSW completed FL2 which will be updated before dc. CSW will continue to follow.   Assessment/plan status:  Psychosocial Support/Ongoing Assessment of Needs Other assessment/ plan:   Information/referral to community resources:   Will return to ALF pending PT/OT evaluation    PATIENT'S/FAMILY'S RESPONSE TO PLAN OF CARE: Patient avoided eye contact and answered questions briefly. Patient did voice that he wishes to return to ALF. Guardian has not returned phone call yet.

## 2011-09-07 NOTE — Progress Notes (Signed)
Upon discharge Dennis Meadows has been transported by the non emergency ambulance. Took his IV out, and gave health education but he may not keep everything in memory. No other concerns.

## 2011-09-07 NOTE — Progress Notes (Signed)
PT Cancellation Note  Treatment cancelled today due to patient's refusal to participate. Pt refusing all activity or movement.  09/07/2011 Milana Kidney DPT PAGER: [phone] OFFICE: [phone]    Milana Kidney 09/07/2011, 2:03 PM

## 2011-09-07 NOTE — Progress Notes (Signed)
OT Cancellation Note  Treatment cancelled today due to patient's refusal to participate: pt declining to participate in any activity. OT attempted second time today with lunch arrival. Pt refusing to eat lunch.    Harrel Carina [place]   OTR/L Pager: [phone] Office: [phone] .

## 2011-09-07 NOTE — Progress Notes (Signed)
Clinical Social Work  Per MD, patient is ready to dc. CSW updated FL2 and faxed FL2 and dc summary to ALF. ALF agreeable to patient returning. CSW informed patient and RN of dc and both were agreeable. CSW called and left a message with patient's guardian. CSW coordinated transportation via [place]. CSW is signing off.  [place], [place] [phone]

## 2011-09-07 NOTE — Progress Notes (Signed)
Pt still refuses to use the SCD's.

## 2012-05-28 ENCOUNTER — Encounter (HOSPITAL_COMMUNITY): Payer: Self-pay | Admitting: *Deleted

## 2012-05-28 ENCOUNTER — Emergency Department (HOSPITAL_COMMUNITY): Payer: Medicare Other

## 2012-05-28 ENCOUNTER — Emergency Department (HOSPITAL_COMMUNITY)
Admission: EM | Admit: 2012-05-28 | Discharge: 2012-05-28 | Disposition: A | Discharge status: Home / Self Care | Payer: Medicare Other | Attending: Emergency Medicine | Admitting: Emergency Medicine

## 2012-05-28 DIAGNOSIS — I10 Essential (primary) hypertension: Secondary | ICD-10-CM | POA: Insufficient documentation

## 2012-05-28 DIAGNOSIS — Z7982 Long term (current) use of aspirin: Secondary | ICD-10-CM | POA: Insufficient documentation

## 2012-05-28 DIAGNOSIS — R05 Cough: Secondary | ICD-10-CM | POA: Insufficient documentation

## 2012-05-28 DIAGNOSIS — R059 Cough, unspecified: Secondary | ICD-10-CM | POA: Insufficient documentation

## 2012-05-28 DIAGNOSIS — I2510 Atherosclerotic heart disease of native coronary artery without angina pectoris: Secondary | ICD-10-CM | POA: Insufficient documentation

## 2012-05-28 DIAGNOSIS — N400 Enlarged prostate without lower urinary tract symptoms: Secondary | ICD-10-CM | POA: Insufficient documentation

## 2012-05-28 DIAGNOSIS — F329 Major depressive disorder, single episode, unspecified: Secondary | ICD-10-CM | POA: Insufficient documentation

## 2012-05-28 DIAGNOSIS — Z79899 Other long term (current) drug therapy: Secondary | ICD-10-CM | POA: Insufficient documentation

## 2012-05-28 DIAGNOSIS — F3289 Other specified depressive episodes: Secondary | ICD-10-CM | POA: Insufficient documentation

## 2012-05-28 DIAGNOSIS — E119 Type 2 diabetes mellitus without complications: Secondary | ICD-10-CM | POA: Insufficient documentation

## 2012-05-28 DIAGNOSIS — Z87891 Personal history of nicotine dependence: Secondary | ICD-10-CM | POA: Insufficient documentation

## 2012-05-28 DIAGNOSIS — Z8781 Personal history of (healed) traumatic fracture: Secondary | ICD-10-CM | POA: Insufficient documentation

## 2012-05-28 DIAGNOSIS — Z8669 Personal history of other diseases of the nervous system and sense organs: Secondary | ICD-10-CM | POA: Insufficient documentation

## 2012-05-28 DIAGNOSIS — E875 Hyperkalemia: Secondary | ICD-10-CM | POA: Insufficient documentation

## 2012-05-28 DIAGNOSIS — F0390 Unspecified dementia without behavioral disturbance: Secondary | ICD-10-CM

## 2012-05-28 LAB — BASIC METABOLIC PANEL
CO2: 24 mEq/L (ref 19–32)
Chloride: 94 mEq/L — ABNORMAL LOW (ref 96–112)
Glucose, Bld: 379 mg/dL — ABNORMAL HIGH (ref 70–99)
Potassium: 5.6 mEq/L — ABNORMAL HIGH (ref 3.5–5.1)
Sodium: 131 mEq/L — ABNORMAL LOW (ref 135–145)

## 2012-05-28 LAB — CBC WITH DIFFERENTIAL/PLATELET
Lymphocytes Relative: 10 % — ABNORMAL LOW (ref 12–46)
Lymphs Abs: 1.2 10*3/uL (ref 0.7–4.0)
MCV: 92.4 fL (ref 78.0–100.0)
Neutro Abs: 10.5 10*3/uL — ABNORMAL HIGH (ref 1.7–7.7)
Neutrophils Relative %: 82 % — ABNORMAL HIGH (ref 43–77)
Platelets: 183 10*3/uL (ref 150–400)
RBC: 5.96 MIL/uL — ABNORMAL HIGH (ref 4.22–5.81)
WBC: 12.7 10*3/uL — ABNORMAL HIGH (ref 4.0–10.5)

## 2012-05-28 LAB — URINALYSIS, ROUTINE W REFLEX MICROSCOPIC
Glucose, UA: >1000 mg/dL — AB
Hgb urine dipstick: NEGATIVE
Leukocytes, UA: NEGATIVE
Specific Gravity, Urine: 1.041 — ABNORMAL HIGH (ref 1.005–1.030)

## 2012-05-28 LAB — GLUCOSE, CAPILLARY: Glucose-Capillary: 358 mg/dL — ABNORMAL HIGH (ref 70–99)

## 2012-05-28 MED ORDER — INSULIN ASPART 100 UNIT/ML ~~LOC~~ SOLN
10.0000 [IU] | Freq: Once | SUBCUTANEOUS | Status: DC
  Filled 2012-05-28: qty 1

## 2012-05-28 MED ORDER — SODIUM CHLORIDE 0.9 % IV BOLUS (SEPSIS)
1000.0000 mL | Freq: Once | INTRAVENOUS | Status: AC
  Administered 2012-05-28: 1000 mL via INTRAVENOUS

## 2012-05-28 MED ORDER — SODIUM CHLORIDE 0.9 % IV SOLN
INTRAVENOUS | Status: DC
  Administered 2012-05-28: 15:00:00 via INTRAVENOUS

## 2012-05-28 MED ORDER — INSULIN ASPART 100 UNIT/ML ~~LOC~~ SOLN
5.0000 [IU] | Freq: Once | SUBCUTANEOUS | Status: AC
  Administered 2012-05-28: 5 [IU] via INTRAVENOUS

## 2012-05-28 NOTE — ED Notes (Addendum)
Pt refuses insulin stating it makes him feel "bad". Pt education provided. MD Belfi made aware. Pt orientated to self but unsure of date, or where he is.

## 2012-05-28 NOTE — ED Notes (Signed)
Assumed care of pt. Pt states he is feeling improved, c/o mild weakness.

## 2012-05-28 NOTE — ED Notes (Signed)
Patient a/o at this time, patient c/o generalized weakness, patient states he does not take his medicines because they make he feel sick,

## 2012-05-28 NOTE — Discharge Instructions (Signed)
Dementia Dementia is a general term for problems with brain function. A person with dementia has memory loss and a hard time with at least one other brain function such as thinking, speaking, or problem solving. Dementia can affect social functioning, how you do your job, your mood, or your personality. The changes may be hidden for a long time. The earliest forms of this disease are usually not detected by family or friends. Dementia can be:  Irreversible.  Potentially reversible.  Partially reversible.  Progressive. This means it can get worse over time. CAUSES  Irreversible dementia causes may include:  Degeneration of brain cells (Alzheimer's disease or lewy body dementia).  Multiple small strokes (vascular dementia).  Infection (chronic meningitis or Creutzfelt-Jakob disease).  Frontotemporal dementia. This affects younger people, age 63 to 69, compared to those who have Alzheimer's disease.  Dementia associated with other disorders like Parkinson's disease, Huntington's disease, or HIV-associated dementia. Potentially or partially reversible dementia causes may include:  Medicines.  Metabolic causes such as excessive alcohol intake, vitamin B12 deficiency, or thyroid disease.  Masses or pressure in the brain such as a tumor, blood clot, or hydrocephalus. SYMPTOMS  Symptoms are often hard to detect. Family members or coworkers may not notice them early in the disease process. Different people with dementia may have different symptoms. Symptoms can include:  A hard time with memory, especially recent memory. Long-term memory may not be impaired.  Asking the same question multiple times or forgetting something someone just said.  A hard time speaking your thoughts or finding certain words.  A hard time solving problems or performing familiar tasks (such as how to use a telephone).  Sudden changes in mood.  Changes in personality, especially increasing moodiness or  mistrust.  Depression.  A hard time understanding complex ideas that were never a problem in the past. DIAGNOSIS  There are no specific tests for dementia.   Your caregiver may recommend a thorough evaluation. This is because some forms of dementia can be reversible. The evaluation will likely include a physical exam and getting a detailed history from you and a family member. The history often gives the best clues and suggestions for a diagnosis.  Memory testing may be done. A detailed brain function evaluation called neuropsychologic testing may be helpful.  Lab tests and brain imaging (such as a CT scan or MRI scan) are sometimes important.  Sometimes observation and re-evaluation over time is very helpful. TREATMENT  Treatment depends on the cause.   If the problem is a vitamin deficiency, it may be helped or cured with supplements.  For dementias such as Alzheimer's disease, medicines are available to stabilize or slow the course of the disease. There are no cures for this type of dementia.  Your caregiver can help direct you to groups, organizations, and other caregivers to help with decisions in the care of you or your loved one. HOME CARE INSTRUCTIONS The care of individuals with dementia is varied and dependent upon the progression of the dementia. The following suggestions are intended for the person living with, or caring for, the person with dementia.  Create a safe environment.  Remove the locks on bathroom doors to prevent the person from accidentally locking himself or herself in.  Use childproof latches on kitchen cabinets and any place where cleaning supplies, chemicals, or alcohol are kept.  Use childproof covers in unused electrical outlets.  Install childproof devices to keep doors and windows secured.  Remove stove knobs or install safety  knobs and an automatic shut-off on the stove.  Lower the temperature on water heaters.  Label medicines and keep them  locked up.  Secure knives, lighters, matches, power tools, and guns, and keep these items out of reach.  Keep the house free from clutter. Remove rugs or anything that might contribute to a fall.  Remove objects that might break and hurt the person.  Make sure lighting is good, both inside and outside.  Install grab rails as needed.  Use a monitoring device to alert you to falls or other needs for help.  Reduce confusion.  Keep familiar objects and people around.  Use night lights or dim lights at night.  Label items or areas.  Use reminders, notes, or directions for daily activities or tasks.  Keep a simple, consistent routine for waking, meals, bathing, dressing, and bedtime.  Create a calm, quiet environment.  Place large clocks and calendars prominently.  Display emergency numbers and home address near all telephones.  Use cues to establish different times of the day. An example is to open curtains to let the natural light in during the day.   Use effective communication.  Choose simple words and short sentences.  Use a gentle, calm tone of voice.  Be careful not to interrupt.  If the person is struggling to find a word or communicate a thought, try to provide the word or thought.  Ask one question at a time. Allow the person ample time to answer questions. Repeat the question again if the person does not respond.  Reduce nighttime restlessness.  Provide a comfortable bed.  Have a consistent nighttime routine.  Ensure a regular walking or physical activity schedule. Involve the person in daily activities as much as possible.  Limit napping during the day.  Limit caffeine.  Attend social events that stimulate rather than overwhelm the senses.  Encourage good nutrition and hydration.  Reduce distractions during meal times and snacks.  Avoid foods that are too hot or too cold.  Monitor chewing and swallowing ability.  Continue with routine vision,  hearing, dental, and medical screenings.  Only give over-the-counter or prescription medicines as directed by the caregiver.  Monitor driving abilities. Do not allow the person to drive when safe driving is no longer possible.  Register with an identification program which could provide location assistance in the event of a missing person situation. SEEK MEDICAL CARE IF:   New behavioral problems start such as moodiness, aggressiveness, or seeing things that are not there (hallucinations).  Any new problem with brain function happens. This includes problems with balance, speech, or falling a lot.  Problems with swallowing develop.  Any symptoms of other illness happen. Small changes or worsening in any aspect of brain function can be a sign that the illness is getting worse. It can also be a sign of another medical illness such as infection. Seeing a caregiver right away is important. SEEK IMMEDIATE MEDICAL CARE IF:   A fever develops.  New or worsened confusion develops.  New or worsened sleepiness develops.  Staying awake becomes hard to do. Document Released: 11/07/2000 Document Revised: 08/06/2011 Document Reviewed: 10/09/2010 [facility] Patient Information 2013 [place], [place]. Hyperkalemia Hyperkalemia means you have too much potassium in your blood. Potassium is a type of salt in the blood (electrolyte). Normally, your kidneys remove potassium from the body. Too much potassium can be life-threatening. HOME CARE  Only take medicine as told by your doctor.  Do not take vitamins or natural products  unless your doctor says they are okay.  Keep all doctor visits as told.  Follow diet instructions as told by your doctor. GET HELP RIGHT AWAY IF:  Your heartbeat is not regular or very slow.  You feel dizzy (lightheaded).  You feel weak.  You are short of breath.  You have chest pain.  You pass out (faint). MAKE SURE YOU:   Understand these instructions.  Will  watch your condition.  Will get help right away if you are not doing well or get worse. Document Released: 05/14/2005 Document Revised: 08/06/2011 Document Reviewed: 11/01/2010 [facility] Patient Information 2013 [place], [place].

## 2012-05-28 NOTE — ED Notes (Signed)
Pt lives at [facility] and has not been taking meds for past two weeks.  Today pt didn't feel well and per staff pt needed help to get to lunch.  Per staff pt was shaky for a few seconds and slightly diaphoretic.  Pt denies n/v/d.  Pt states he refuses meds because they make him feel bad.  With EMS, pt's glucose was 380.

## 2012-05-28 NOTE — ED Notes (Signed)
Attempted to call [facility] x 2 with no response. Report given to PTAR. PT made aware of DC instructions.

## 2012-05-28 NOTE — ED Provider Notes (Signed)
History     CSN: [id number]  Arrival date & time 05/28/12  1325   First MD Initiated Contact with Patient 05/28/12 1327      Chief Complaint  Patient presents with  . Weakness    (Consider location/radiation/quality/duration/timing/severity/associated sxs/prior treatment) HPI Comments: Pt was sent over from [facility] because the staff says that he is refusing to take his medications.  When I ask pt about this, he says that he is not taking his medicines because they make him feel sick.  When I told him that it is important to take his medications, he says " I'm not going to take those God damned medications".  He currently denies complaints, other than saying that he is hungry.  He denies any chest pain or shortness of breath. He denies any weakness. He states these ambulating normally in fact he states he just walked to the bathroom. He does complain of a mild cough. He denies any fevers or chills. He denies any nausea vomiting or diarrhea.   Past Medical History  Diagnosis Date  . Dementia   . DNR (do not resuscitate)   . Cerebral hemorrhage   . Hypertension   . Depression   . Diabetes mellitus   . BPH (benign prostatic hypertrophy)   . Coronary artery disease   . Atrial fibrillation   . Vitamin B 12 deficiency   . C2 cervical fracture   . Former smoker     Past Surgical History  Procedure Date  . Coronary artery bypass graft     No family history on file.  History  Substance Use Topics  . Smoking status: Former Smoker    Types: Cigarettes  . Smokeless tobacco: Not on file  . Alcohol Use: No      Review of Systems  Constitutional: Negative for fever, chills, diaphoresis and fatigue.  HENT: Negative for congestion, rhinorrhea and sneezing.   Eyes: Negative.   Respiratory: Positive for cough. Negative for chest tightness and shortness of breath.   Cardiovascular: Negative for chest pain and leg swelling.  Gastrointestinal: Negative for nausea,  vomiting, abdominal pain, diarrhea and blood in stool.  Genitourinary: Negative for frequency, hematuria, flank pain and difficulty urinating.  Musculoskeletal: Negative for back pain and arthralgias.  Skin: Negative for rash.  Neurological: Negative for dizziness, speech difficulty, weakness, numbness and headaches.    Allergies  Review of patient's allergies indicates no known allergies.  Home Medications   Current Outpatient Rx  Name  Route  Sig  Dispense  Refill  . ACETAMINOPHEN 500 MG PO TABS   Oral   Take 500 mg by mouth every 4 (four) hours as needed. For pain         . ALUMINUM & MAGNESIUM HYDROXIDE 200-200 MG/5ML PO SUSP   Oral   Take 30 mLs by mouth every 6 (six) hours as needed. For indigestion         . ASPIRIN EC 81 MG PO TBEC   Oral   Take 81 mg by mouth daily.         Marland Kitchen DOXAZOSIN MESYLATE 2 MG PO TABS   Oral   Take 2 mg by mouth daily.         Marland Kitchen GLIPIZIDE 5 MG PO TABS   Oral   Take 5 mg by mouth daily.         . GUAIFENESIN 100 MG/5ML PO LIQD   Oral   Take 200 mg by mouth 4 (four) times daily  as needed. For cough         . LISINOPRIL 5 MG PO TABS   Oral   Take 2 tablets (10 mg total) by mouth daily.   30 tablet   0   . LOPERAMIDE HCL 2 MG PO TABS   Oral   Take 2 mg by mouth as needed. For diarrhea         . MAGNESIUM HYDROXIDE 400 MG/5ML PO SUSP   Oral   Take 30 mLs by mouth at bedtime as needed. For constipation         . METOPROLOL TARTRATE 25 MG PO TABS   Oral   Take 25 mg by mouth 2 (two) times daily.         Marland Kitchen RANITIDINE HCL 150 MG PO CAPS   Oral   Take 150 mg by mouth daily.         Marland Kitchen RIVASTIGMINE 9.5 MG/24HR TD PT24   Transdermal   Place 1 patch onto the skin daily.         . SERTRALINE HCL 25 MG PO TABS   Oral   Take 25 mg by mouth at bedtime.         Marland Kitchen VITAMIN B-12 1000 MCG PO TABS   Oral   Take 1,000 mcg by mouth daily.           BP 151/82  Pulse 93  Temp 97.6 F (36.4 C) (Oral)  Resp 11   SpO2 93%  Physical Exam  Constitutional: He appears well-developed and well-nourished.       Appears disheveled  HENT:  Head: Normocephalic and atraumatic.  Eyes: Pupils are equal, round, and reactive to light.  Neck: Normal range of motion. Neck supple.  Cardiovascular: Normal rate, regular rhythm and normal heart sounds.   Pulmonary/Chest: Effort normal and breath sounds normal. No respiratory distress. He has no wheezes. He has no rales. He exhibits no tenderness.  Abdominal: Soft. Bowel sounds are normal. There is no tenderness. There is no rebound and no guarding.  Musculoskeletal: Normal range of motion. He exhibits no edema.  Lymphadenopathy:    He has no cervical adenopathy.  Neurological: He is alert. He has normal strength. No cranial nerve deficit or sensory deficit. GCS eye subscore is 4. GCS verbal subscore is 4. GCS motor subscore is 6.       Answers questions appropriately, but says that he does not know where he is or what day it is.  Skin: Skin is warm and dry. No rash noted.  Psychiatric: He has a normal mood and affect.    ED Course  Procedures (including critical care time)  Results for orders placed during the hospital encounter of 05/28/12  CBC WITH DIFFERENTIAL      Component Value Range   WBC 12.7 (*) 4.0 - 10.5 K/uL   RBC 5.96 (*) 4.22 - 5.81 MIL/uL   Hemoglobin 19.0 (*) 13.0 - 17.0 g/dL   HCT 82.9 (*) 56.2 - 13.0 %   MCV 92.4  78.0 - 100.0 fL   MCH 31.9  26.0 - 34.0 pg   MCHC 34.5  30.0 - 36.0 g/dL   RDW 86.5  78.4 - 69.6 %   Platelets 183  150 - 400 K/uL   Neutrophils Relative 82 (*) 43 - 77 %   Neutro Abs 10.5 (*) 1.7 - 7.7 K/uL   Lymphocytes Relative 10 (*) 12 - 46 %   Lymphs Abs 1.2  0.7 - 4.0 K/uL  Monocytes Relative 7  3 - 12 %   Monocytes Absolute 0.9  0.1 - 1.0 K/uL   Eosinophils Relative 0  0 - 5 %   Eosinophils Absolute 0.1  0.0 - 0.7 K/uL   Basophils Relative 1  0 - 1 %   Basophils Absolute 0.1  0.0 - 0.1 K/uL  BASIC METABOLIC PANEL        Component Value Range   Sodium 131 (*) 135 - 145 mEq/L   Potassium 5.6 (*) 3.5 - 5.1 mEq/L   Chloride 94 (*) 96 - 112 mEq/L   CO2 24  19 - 32 mEq/L   Glucose, Bld 379 (*) 70 - 99 mg/dL   BUN 16  6 - 23 mg/dL   Creatinine, Ser 1.61  0.50 - 1.35 mg/dL   Calcium 09.6  8.4 - 04.5 mg/dL   GFR calc non Af Amer 83 (*) >90 mL/min   GFR calc Af Amer >90  >90 mL/min  URINALYSIS, ROUTINE W REFLEX MICROSCOPIC      Component Value Range   Color, Urine YELLOW  YELLOW   APPearance CLEAR  CLEAR   Specific Gravity, Urine 1.041 (*) 1.005 - 1.030   pH 5.0  5.0 - 8.0   Glucose, UA >1000 (*) NEGATIVE mg/dL   Hgb urine dipstick NEGATIVE  NEGATIVE   Bilirubin Urine NEGATIVE  NEGATIVE   Ketones, ur 40 (*) NEGATIVE mg/dL   Protein, ur NEGATIVE  NEGATIVE mg/dL   Urobilinogen, UA 0.2  0.0 - 1.0 mg/dL   Nitrite NEGATIVE  NEGATIVE   Leukocytes, UA NEGATIVE  NEGATIVE  URINE MICROSCOPIC-ADD ON      Component Value Range   WBC, UA 0-2  <3 WBC/hpf   RBC / HPF 0-2  <3 RBC/hpf   Dg Chest 2 View  05/28/2012  *RADIOLOGY REPORT*  Clinical Data: Nonproductive cough  CHEST - 2 VIEW  Comparison: 09/06/2011  Findings: Mild cardiomegaly remains stable.  Both lungs are clear. No evidence of pleural effusion.  Prior CABG again noted.  Old lower thoracic vertebral body compression fracture noted.  IMPRESSION: Stable cardiomegaly.  No active lung disease.   Original Report Authenticated By: Myles Rosenthal, M.D.       Date: 05/28/2012  Rate: 98  Rhythm: normal sinus rhythm  QRS Axis: normal  Intervals: PR prolonged  ST/T Wave abnormalities: nonspecific ST/T changes  Conduction Disutrbances:none  Narrative Interpretation:   Old EKG Reviewed: unchanged     1. Dementia   2. Hyperkalemia       MDM  Patient is well-appearing with some mild signs of dehydration and mildly elevated potassium. His glucoses also elevated in the 300s. There is no evidence of DKA patient's renal function is normal. He was given a dose  of insulin here which should help both with the hyperkalemia and the hyperglycemia.  He is ambulating normally and has no evidence of infection. His only complaint here is that he doesn't take his medications. He ate a meal tray without problem. I feel this is likely related to his dementia and I don't feel at this point warrants hospitalization. I attempted to contact the nursing home however it continued to go to a voice mail. I did note on the discharge papers that he will need to have his potassium rechecked within the next few days and he'll need a followup with his primary care physician for further assessment of his medications.        Rolan Bucco, MD 05/28/12 [phone]

## 2012-05-28 NOTE — ED Notes (Signed)
MD Belfi made aware of BG [id number]

## 2012-05-30 LAB — URINE CULTURE: Colony Count: 40000

## 2013-03-29 ENCOUNTER — Encounter (HOSPITAL_COMMUNITY): Payer: Self-pay | Admitting: *Deleted

## 2013-03-29 ENCOUNTER — Emergency Department (HOSPITAL_COMMUNITY): Payer: Medicare Other

## 2013-03-29 ENCOUNTER — Inpatient Hospital Stay (HOSPITAL_COMMUNITY)
Admission: EM | Admit: 2013-03-29 | Discharge: 2013-04-03 | DRG: 871 | Disposition: A | Discharge status: Skilled Nursing Facility | Payer: Medicare Other | Attending: Internal Medicine | Admitting: Internal Medicine

## 2013-03-29 ENCOUNTER — Inpatient Hospital Stay (HOSPITAL_COMMUNITY): Payer: Medicare Other

## 2013-03-29 DIAGNOSIS — R55 Syncope and collapse: Secondary | ICD-10-CM

## 2013-03-29 DIAGNOSIS — Z87891 Personal history of nicotine dependence: Secondary | ICD-10-CM

## 2013-03-29 DIAGNOSIS — Z79899 Other long term (current) drug therapy: Secondary | ICD-10-CM

## 2013-03-29 DIAGNOSIS — G9349 Other encephalopathy: Secondary | ICD-10-CM | POA: Diagnosis present

## 2013-03-29 DIAGNOSIS — J189 Pneumonia, unspecified organism: Secondary | ICD-10-CM | POA: Diagnosis present

## 2013-03-29 DIAGNOSIS — F329 Major depressive disorder, single episode, unspecified: Secondary | ICD-10-CM | POA: Diagnosis present

## 2013-03-29 DIAGNOSIS — Z951 Presence of aortocoronary bypass graft: Secondary | ICD-10-CM

## 2013-03-29 DIAGNOSIS — Z23 Encounter for immunization: Secondary | ICD-10-CM

## 2013-03-29 DIAGNOSIS — E876 Hypokalemia: Secondary | ICD-10-CM | POA: Diagnosis present

## 2013-03-29 DIAGNOSIS — I10 Essential (primary) hypertension: Secondary | ICD-10-CM

## 2013-03-29 DIAGNOSIS — E872 Acidosis, unspecified: Secondary | ICD-10-CM | POA: Diagnosis present

## 2013-03-29 DIAGNOSIS — Z8673 Personal history of transient ischemic attack (TIA), and cerebral infarction without residual deficits: Secondary | ICD-10-CM

## 2013-03-29 DIAGNOSIS — R739 Hyperglycemia, unspecified: Secondary | ICD-10-CM

## 2013-03-29 DIAGNOSIS — F3289 Other specified depressive episodes: Secondary | ICD-10-CM | POA: Diagnosis present

## 2013-03-29 DIAGNOSIS — I4891 Unspecified atrial fibrillation: Secondary | ICD-10-CM

## 2013-03-29 DIAGNOSIS — Z66 Do not resuscitate: Secondary | ICD-10-CM | POA: Diagnosis present

## 2013-03-29 DIAGNOSIS — N400 Enlarged prostate without lower urinary tract symptoms: Secondary | ICD-10-CM | POA: Diagnosis present

## 2013-03-29 DIAGNOSIS — E860 Dehydration: Secondary | ICD-10-CM | POA: Diagnosis present

## 2013-03-29 DIAGNOSIS — R509 Fever, unspecified: Secondary | ICD-10-CM

## 2013-03-29 DIAGNOSIS — F0390 Unspecified dementia without behavioral disturbance: Secondary | ICD-10-CM | POA: Diagnosis present

## 2013-03-29 DIAGNOSIS — E538 Deficiency of other specified B group vitamins: Secondary | ICD-10-CM | POA: Diagnosis present

## 2013-03-29 DIAGNOSIS — I2510 Atherosclerotic heart disease of native coronary artery without angina pectoris: Secondary | ICD-10-CM

## 2013-03-29 DIAGNOSIS — E119 Type 2 diabetes mellitus without complications: Secondary | ICD-10-CM

## 2013-03-29 DIAGNOSIS — G459 Transient cerebral ischemic attack, unspecified: Secondary | ICD-10-CM

## 2013-03-29 DIAGNOSIS — Z794 Long term (current) use of insulin: Secondary | ICD-10-CM

## 2013-03-29 DIAGNOSIS — E1165 Type 2 diabetes mellitus with hyperglycemia: Secondary | ICD-10-CM | POA: Diagnosis present

## 2013-03-29 DIAGNOSIS — A419 Sepsis, unspecified organism: Secondary | ICD-10-CM

## 2013-03-29 DIAGNOSIS — IMO0001 Reserved for inherently not codable concepts without codable children: Secondary | ICD-10-CM | POA: Diagnosis present

## 2013-03-29 LAB — POCT I-STAT 3, VENOUS BLOOD GAS (G3P V)
Acid-base deficit: 6 mmol/L — ABNORMAL HIGH (ref 0.0–2.0)
Bicarbonate: 19.3 mEq/L — ABNORMAL LOW (ref 20.0–24.0)
O2 Saturation: 90 %
TCO2: 20 mmol/L (ref 0–100)
pCO2, Ven: 38.2 mmHg — ABNORMAL LOW (ref 45.0–50.0)
pH, Ven: 7.312 — ABNORMAL HIGH (ref 7.250–7.300)
pO2, Ven: 64 mmHg — ABNORMAL HIGH (ref 30.0–45.0)

## 2013-03-29 LAB — URINALYSIS, ROUTINE W REFLEX MICROSCOPIC
Ketones, ur: 15 mg/dL — AB
Protein, ur: NEGATIVE mg/dL
Urobilinogen, UA: 0.2 mg/dL (ref 0.0–1.0)

## 2013-03-29 LAB — CBC WITH DIFFERENTIAL/PLATELET
HCT: 49.5 % (ref 39.0–52.0)
Hemoglobin: 17.3 g/dL — ABNORMAL HIGH (ref 13.0–17.0)
Lymphocytes Relative: 5 % — ABNORMAL LOW (ref 12–46)
Monocytes Absolute: 2.8 10*3/uL — ABNORMAL HIGH (ref 0.1–1.0)
Monocytes Relative: 13 % — ABNORMAL HIGH (ref 3–12)
Neutro Abs: 17.5 10*3/uL — ABNORMAL HIGH (ref 1.7–7.7)
WBC: 21.4 10*3/uL — ABNORMAL HIGH (ref 4.0–10.5)

## 2013-03-29 LAB — COMPREHENSIVE METABOLIC PANEL
AST: 11 U/L (ref 0–37)
BUN: 44 mg/dL — ABNORMAL HIGH (ref 6–23)
CO2: 19 mEq/L (ref 19–32)
Chloride: 100 mEq/L (ref 96–112)
Creatinine, Ser: 1.43 mg/dL — ABNORMAL HIGH (ref 0.50–1.35)
GFR calc non Af Amer: 46 mL/min — ABNORMAL LOW (ref >90)
Total Bilirubin: 0.6 mg/dL (ref 0.3–1.2)

## 2013-03-29 LAB — POCT I-STAT TROPONIN I: Troponin i, poc: 0.06 ng/mL (ref 0.00–0.08)

## 2013-03-29 LAB — GLUCOSE, CAPILLARY
Glucose-Capillary: >600 mg/dL (ref 70–99)
Glucose-Capillary: 469 mg/dL — ABNORMAL HIGH (ref 70–99)
Glucose-Capillary: 447 mg/dL — ABNORMAL HIGH (ref 70–99)

## 2013-03-29 LAB — CG4 I-STAT (LACTIC ACID): Lactic Acid, Venous: 4.51 mmol/L — ABNORMAL HIGH (ref 0.5–2.2)

## 2013-03-29 LAB — URINE MICROSCOPIC-ADD ON

## 2013-03-29 MED ORDER — VANCOMYCIN HCL IN DEXTROSE 1-5 GM/200ML-% IV SOLN: 1000.0000 mg | Freq: Once | INTRAVENOUS | Status: DC

## 2013-03-29 MED ORDER — ACETAMINOPHEN 500 MG PO TABS
1000.0000 mg | ORAL_TABLET | Freq: Once | ORAL | Status: AC
  Administered 2013-03-29: 1000 mg via ORAL
  Filled 2013-03-29: qty 2

## 2013-03-29 MED ORDER — SODIUM CHLORIDE 0.9 % IV SOLN
1000.0000 mL | Freq: Once | INTRAVENOUS | Status: AC
  Administered 2013-03-29: 1000 mL via INTRAVENOUS

## 2013-03-29 MED ORDER — PIPERACILLIN-TAZOBACTAM 3.375 G IVPB
3.3750 g | Freq: Three times a day (TID) | INTRAVENOUS | Status: DC
  Administered 2013-03-30 – 2013-04-02 (×10): 3.375 g via INTRAVENOUS
  Filled 2013-03-29 (×15): qty 50

## 2013-03-29 MED ORDER — PIPERACILLIN-TAZOBACTAM 3.375 G IVPB 30 MIN
3.3750 g | Freq: Once | INTRAVENOUS | Status: AC
  Administered 2013-03-29: 3.375 g via INTRAVENOUS
  Filled 2013-03-29: qty 50

## 2013-03-29 MED ORDER — SODIUM CHLORIDE 0.9 % IV SOLN
INTRAVENOUS | Status: DC
  Administered 2013-03-29: 4.1 [IU]/h via INTRAVENOUS
  Filled 2013-03-29: qty 1

## 2013-03-29 MED ORDER — VANCOMYCIN HCL IN DEXTROSE 1-5 GM/200ML-% IV SOLN: 1000.0000 mg | Freq: Two times a day (BID) | INTRAVENOUS | Status: DC

## 2013-03-29 MED ORDER — VANCOMYCIN HCL IN DEXTROSE 1-5 GM/200ML-% IV SOLN
1000.0000 mg | Freq: Two times a day (BID) | INTRAVENOUS | Status: DC
  Administered 2013-03-29: 1000 mg via INTRAVENOUS
  Filled 2013-03-29: qty 200

## 2013-03-29 MED ORDER — PIPERACILLIN-TAZOBACTAM 3.375 G IVPB 30 MIN: 3.3750 g | Freq: Once | INTRAVENOUS | Status: DC

## 2013-03-29 MED ORDER — VANCOMYCIN HCL IN DEXTROSE 1-5 GM/200ML-% IV SOLN
1000.0000 mg | INTRAVENOUS | Status: DC
  Filled 2013-03-29: qty 200

## 2013-03-29 MED ORDER — SODIUM CHLORIDE 0.9 % IV SOLN
1000.0000 mL | INTRAVENOUS | Status: DC
  Administered 2013-03-29: 1000 mL via INTRAVENOUS

## 2013-03-29 NOTE — ED Notes (Signed)
Staff at SNF called EMS because Pt ws found in bed by 2nd shift and thought to be aciing different .EMS reported Pt CBG read High at SNF . Pt O2%c lower on arrival . Pt transported with a 100% nonrebreather.

## 2013-03-29 NOTE — Progress Notes (Signed)
Pharmacy note: vancomycin  -SCr= 1.43, CrCl ~ 40 (baseline appears to be 0.6-0.8)  Plan -Will change vancomycin to 1000mg  IV q24h -Will follow lab trends  Harland German, Pharm D 03/29/2013 8:31 PM

## 2013-03-29 NOTE — ED Notes (Signed)
Returned from CT.

## 2013-03-29 NOTE — Progress Notes (Signed)
ANTIBIOTIC CONSULT NOTE - INITIAL  Pharmacy Consult for Vancomycin, Zosyn Indication: rule out sepsis  No Known Allergies  Patient Measurements: Height: 5\' 5"  (165.1 cm) Weight: 160 lb (72.576 kg) IBW/kg (Calculated) : 61.5 Adjusted Body Weight:   Vital Signs: Temp: 101.1 F (38.4 C) (11/02 1856) Temp src: Rectal (11/02 1856) BP: 161/80 mmHg (11/02 1930) Pulse Rate: 121 (11/02 1915) Intake/Output from previous day:   Intake/Output from this shift: Total I/O In: 1000 [I.V.:1000] Out: -   Labs:  Recent Labs  03/29/13 1910  WBC 21.4*  HGB 17.3*  PLT 252   Estimated Creatinine Clearance: 63.6 ml/min (by C-G formula based on Cr of 0.86). No results found for this basename: VANCOTROUGH, VANCOPEAK, VANCORANDOM, GENTTROUGH, GENTPEAK, GENTRANDOM, TOBRATROUGH, TOBRAPEAK, TOBRARND, AMIKACINPEAK, AMIKACINTROU, AMIKACIN,  in the last 72 hours   Microbiology: No results found for this or any previous visit (from the past 720 hour(s)).  Medical History: Past Medical History  Diagnosis Date  . Dementia   . DNR (do not resuscitate)   . Cerebral hemorrhage   . Hypertension   . Depression   . Diabetes mellitus   . BPH (benign prostatic hypertrophy)   . Coronary artery disease   . Atrial fibrillation   . Vitamin B 12 deficiency   . C2 cervical fracture   . Former smoker     Medications:  Scheduled:   Assessment: 78yo male admitted with R/O sepsis, to start Vanc and Zosyn.  Currently no Cr resulted, but no history of renal dysfunction.  Will estimate CrCl ~60 ml/min based on Cr 1, and adjust dosing if necessary once labs are resulted.  Goal of Therapy:  Vancomycin trough level 15-20 mcg/ml  Plan:  1.  Vancomycin 1000mg  IV q12 2.  Zosyn 3.375gm IV q8, 4hr infusion 3.  F/U baseline labs and adjust dosing prn  Marisue Humble, PharmD Clinical Pharmacist [facility]

## 2013-03-29 NOTE — ED Provider Notes (Signed)
CSN: [id number]     Arrival date & time 03/29/13  1846 History   First MD Initiated Contact with Patient 03/29/13 1855     Chief Complaint  Patient presents with  . Altered Mental Status  . Hyperglycemia   (Consider location/radiation/quality/duration/timing/severity/associated sxs/prior Treatment) HPI History limited by patient condition of dementia.   Pt here from SNF for fever and confusion. Onset was today, constant, worsening.  The problem is severe.  Associated symptoms: abdominal pain, no trauma.  Recent medical care: EMS found SpO2 low so placed on oxygen.   Past Medical History  Diagnosis Date  . Dementia   . DNR (do not resuscitate)   . Cerebral hemorrhage   . Hypertension   . Depression   . Diabetes mellitus   . BPH (benign prostatic hypertrophy)   . Coronary artery disease   . Atrial fibrillation   . Vitamin B 12 deficiency   . C2 cervical fracture   . Former smoker    Past Surgical History  Procedure Laterality Date  . Coronary artery bypass graft     History reviewed. No pertinent family history. History  Substance Use Topics  . Smoking status: Former Smoker    Types: Cigarettes  . Smokeless tobacco: Not on file  . Alcohol Use: No    Review of Systems History limited by patient condition of dementia.  Allergies  Review of patient's allergies indicates no known allergies.  Home Medications   No current outpatient prescriptions on file. BP 124/43  Pulse 94  Temp(Src) 99.4 F (37.4 C) (Oral)  Resp 11  Ht 5\' 5"  (1.651 m)  Wt 144 lb 2.9 oz (65.4 kg)  BMI 23.99 kg/m2  SpO2 98% Physical Exam Nursing note and vitals reviewed.  Constitutional: Pt is awake and appears stated age. Eyes: No injection, no scleral icterus. HENT: Atraumatic, airway open without erythema or exudate.  Respiratory: Tachypnea. Breath sounds heard bilaterally. Coarse on left.  Cardiovascular: Tachycardic rate. Extremities warm and well perfused.  Abdomen: Soft,  non-tender. MSK: Extremities are atraumatic without deformity. Skin: No rash, no wounds.   Neuro: No motor nor sensory deficit. No seizure activity.      ED Course  Procedures (including critical care time) Labs Review Labs Reviewed  CBC WITH DIFFERENTIAL - Abnormal; Notable for the following:    WBC 21.4 (*)    Hemoglobin 17.3 (*)    Neutrophils Relative % 82 (*)    Neutro Abs 17.5 (*)    Lymphocytes Relative 5 (*)    Monocytes Relative 13 (*)    Monocytes Absolute 2.8 (*)    All other components within normal limits  COMPREHENSIVE METABOLIC PANEL - Abnormal; Notable for the following:    Glucose, Bld 721 (*)    BUN 44 (*)    Creatinine, Ser 1.43 (*)    Albumin 3.3 (*)    GFR calc non Af Amer 46 (*)    GFR calc Af Amer 53 (*)    All other components within normal limits  URINALYSIS, ROUTINE W REFLEX MICROSCOPIC - Abnormal; Notable for the following:    Glucose, UA >1000 (*)    Hgb urine dipstick LARGE (*)    Ketones, ur 15 (*)    All other components within normal limits  GLUCOSE, CAPILLARY - Abnormal; Notable for the following:    Glucose-Capillary >600 (*)    All other components within normal limits  GLUCOSE, CAPILLARY - Abnormal; Notable for the following:    Glucose-Capillary 469 (*)  All other components within normal limits  GLUCOSE, CAPILLARY - Abnormal; Notable for the following:    Glucose-Capillary 447 (*)    All other components within normal limits  GLUCOSE, CAPILLARY - Abnormal; Notable for the following:    Glucose-Capillary 353 (*)    All other components within normal limits  GLUCOSE, CAPILLARY - Abnormal; Notable for the following:    Glucose-Capillary 284 (*)    All other components within normal limits  COMPREHENSIVE METABOLIC PANEL - Abnormal; Notable for the following:    Sodium 147 (*)    Chloride 114 (*)    Glucose, Bld 179 (*)    BUN 40 (*)    Albumin 3.1 (*)    GFR calc non Af Amer 78 (*)    GFR calc Af Amer 90 (*)    All other  components within normal limits  CBC WITH DIFFERENTIAL - Abnormal; Notable for the following:    WBC 22.5 (*)    Neutrophils Relative % 79 (*)    Neutro Abs 17.7 (*)    Lymphocytes Relative 11 (*)    Monocytes Absolute 2.3 (*)    All other components within normal limits  GLUCOSE, CAPILLARY - Abnormal; Notable for the following:    Glucose-Capillary 245 (*)    All other components within normal limits  GLUCOSE, CAPILLARY - Abnormal; Notable for the following:    Glucose-Capillary 218 (*)    All other components within normal limits  GLUCOSE, CAPILLARY - Abnormal; Notable for the following:    Glucose-Capillary 137 (*)    All other components within normal limits  GLUCOSE, CAPILLARY - Abnormal; Notable for the following:    Glucose-Capillary 171 (*)    All other components within normal limits  GLUCOSE, CAPILLARY - Abnormal; Notable for the following:    Glucose-Capillary 164 (*)    All other components within normal limits  GLUCOSE, CAPILLARY - Abnormal; Notable for the following:    Glucose-Capillary 161 (*)    All other components within normal limits  GLUCOSE, CAPILLARY - Abnormal; Notable for the following:    Glucose-Capillary 190 (*)    All other components within normal limits  GLUCOSE, CAPILLARY - Abnormal; Notable for the following:    Glucose-Capillary 320 (*)    All other components within normal limits  CG4 I-STAT (LACTIC ACID) - Abnormal; Notable for the following:    Lactic Acid, Venous 4.51 (*)    All other components within normal limits  POCT I-STAT 3, BLOOD GAS (G3P V) - Abnormal; Notable for the following:    pH, Ven 7.312 (*)    pCO2, Ven 38.2 (*)    pO2, Ven 64.0 (*)    Bicarbonate 19.3 (*)    Acid-base deficit 6.0 (*)    All other components within normal limits  MRSA PCR SCREENING  CULTURE, BLOOD (ROUTINE X 2)  CULTURE, BLOOD (ROUTINE X 2)  URINE CULTURE  URINE MICROSCOPIC-ADD ON  LACTIC ACID, PLASMA  TSH  INFLUENZA PANEL BY PCR  HEMOGLOBIN A1C   POCT I-STAT TROPONIN I   Imaging Review Ct Abdomen Pelvis Wo Contrast  03/29/2013   CLINICAL DATA:  78 year old male with altered mental status, hyperglycemia, diabetes. Suspected sepsis. Initial encounter.  EXAM: CT ABDOMEN AND PELVIS WITHOUT CONTRAST  TECHNIQUE: Multidetector CT imaging of the abdomen and pelvis was performed following the standard protocol without intravenous contrast.  COMPARISON:  None.  FINDINGS: Cardiomegaly. Extensive Aortoiliac calcified atherosclerosis noted. Extensive bilateral proximal femoral calcified atherosclerosis. Extensive coronary artery calcified atherosclerosis.  No pericardial effusion. Trace pleural effusions. Patchy opacity in the left lower lobe costophrenic angle, favor atelectasis.  Chronic T12 compression fracture with associated kyphosis. No acute osseous abnormality identified.  Foley catheter in the bladder which is largely decompressed, containing only trace air and fluid. No pelvic free fluid.  Small volume of fluid in the rectum which otherwise appears negative. Redundant sigmoid colon. Proximal sigmoid diverticulosis which continues into the descending colon, but no definite active diverticulitis. Negative transverse colon. Mild diverticulosis at the hepatic flexure. Negative right colon. No appendix identified. Negative terminal ileum. No dilated small bowel.  Negative noncontrast stomach, duodenum, liver (except for occasional calcified granulomas) spleen, pancreas, and adrenal glands.  The gallbladder is isodense (series 2, image 32. It is difficult to exclude gallbladder wall thickening although no overt pericholecystic inflammation is identified. No biliary ductal enlargement.  Negative right kidney and ureter. Negative left ureter. Left kidney is remarkable for a lobulated partially exophytic low-density lesion measuring about 3 cm. Densitometry favors a benign cyst although there is subtle evidence of some septations within. No perinephric stranding.  No abdominal free fluid. No lymphadenopathy.  IMPRESSION: 1. Trace pleural effusions and mild patchy opacity in the left lower lobe, favor atelectasis rather than early pneumonia.  2. Isodense gallbladder suggesting sludge or stones. Still, no strong CT evidence of acute cholecystitis. Right upper quadrant ultrasound may be confirmatory.  3. No other acute or inflammatory process identified in the abdomen or pelvis.  4. Other findings including: Diverticulosis. Advanced calcified atherosclerosis. Mildly exophytic and complex but probably benign left renal cystic lesion.   Electronically Signed   By: Augusto Gamble M.D.   On: 03/29/2013 21:28   Ct Head Wo Contrast  03/30/2013   CLINICAL DATA:  78 year old male with altered mental status. History of previous C2 cervical fracture.  EXAM: CT HEAD WITHOUT CONTRAST  TECHNIQUE: Contiguous axial images were obtained from the base of the skull through the vertex without intravenous contrast.  COMPARISON:  None.  FINDINGS: Calcified atherosclerosis at the skull base. Calcified atherosclerosis of scalp vasculature also.  Mild motion artifact. Opacified right mastoid air cells. Mild ethmoid sinus mucosal thickening. Other Visualized paranasal sinuses and mastoids are clear.  Calvarium appears intact. Abnormal appearance of the visible C2 vertebra (series 3, image 1) compatible with previous fracture but not otherwise characterized.  Chronic appearing left inferior cerebellar, PICA territory, infarct. Cerebral white matter periventricular hypodensity. Occasional small areas of cortical encephalomalacia in the cerebral hemispheres (right frontal lobe on series 2, image 27). No midline shift, mass effect, or evidence of intracranial mass lesion. No acute intracranial hemorrhage identified. No definite acute cortically based infarct. No suspicious intracranial vascular hyperdensity. No ventriculomegaly.  IMPRESSION: 1. Chronic small and medium-sized vessel ischemia, most pronounced  in the left cerebellum. No definite acute intracranial abnormality.  2. Extensive calcified atherosclerosis, suggesting the possibility of chronic renal failure.  3.  C2 fracture partially visible, reportedly chronic.  4.  Right mastoid effusion.   Electronically Signed   By: Augusto Gamble M.D.   On: 03/30/2013 00:01   Ct Cervical Spine Wo Contrast  03/30/2013   CLINICAL DATA:  Evaluate C2 fracture.  EXAM: CT CERVICAL SPINE WITHOUT CONTRAST  TECHNIQUE: Multidetector CT imaging of the cervical spine was performed without intravenous contrast. Multiplanar CT image reconstructions were also generated.  COMPARISON:  CT head 03/29/2013  FINDINGS: Type 3 fracture the dens appears chronic and healed. 6 mm anterior displacement of the dens relative to the body of C2.  Posterior arch of C1 also is displaced anteriorly related to the displaced fracture the dens. This is causing spinal stenosis at the C1-2 level. The spinal cord is not adequately evaluated on CT. No acute fracture.  Cervical disc degeneration and mild spondylosis C3 through C7. Cervical facet degeneration also present in the cervical spine. No mass lesion.  Advanced atherosclerotic disease throughout the carotid artery and great vessels bilaterally. Negative for mass or adenopathy in the neck.  Right mastoid sinus effusion. Right middle ear effusion.  IMPRESSION: Chronic healed fracture of the dens with 6 mm anterior displacement of the dens and spinal stenosis at C1-2. No acute fracture  Cervical degenerative changes as above.   Electronically Signed   By: Marlan Palau M.D.   On: 03/30/2013 10:02   US Abdomen Complete  03/29/2013   CLINICAL DATA:  78 year old male with altered mental status and suspected sepsis. Increased density of the gallbladder on CT.  EXAM: ULTRASOUND ABDOMEN COMPLETE  COMPARISON:  None contrast CT Abdomen and Pelvis 2106 hr the same day.  FINDINGS: Gallbladder  The gallbladder wall appears normal in thickness, 2 mm. There is evidence  of dependent and some tumefactive sludge in the gallbladder. No shadowing echogenic gallstones identified. No sonographic Murphy's sign elicited.  Common bile duct  Diameter: 6 mm, within normal limits. Visualization of the CBD is somewhat limited by overlying bowel gas.  Liver  Increased echogenicity compatible with steatosis. Heterogeneity felt related to steatosis. No discrete liver mass identified.  IVC  Not visualized due to overlying bowel gas.  Pancreas  Not visualized due to overlying bowel gas.  Spleen  Not visualized due to overlying bowel gas, decreased acoustic window.  Right Kidney  Length: 11.0 cm. Echogenicity within normal limits. No mass or hydronephrosis visualized.  Left Kidney  Length: 10.5 cm. Exophytic lesion as seen on CT appears to be a simple cyst by ultrasound (images 56 and 59). Echogenicity within normal limits. No mass or hydronephrosis visualized.  Abdominal aorta  Incompletely visualized due to overlying bowel gas, visualized portions within normal limits.  IMPRESSION: 1. Gallbladder sludge but no evidence of acute cholecystitis. No definite gallstone.  2.  Heterogeneous liver, favor related to hepatic steatosis.  3. Left kidney exophytic lesion as seen on the earlier CT appears to be a benign cyst.  4. Some abdominal visceral not well visualized by ultrasound due to overlying bowel gas.   Electronically Signed   By: Augusto Gamble M.D.   On: 03/29/2013 23:47   Dg Chest Port 1 View  03/30/2013   CLINICAL DATA:  Evaluate infiltrates.  EXAM: PORTABLE CHEST - 1 VIEW  COMPARISON:  CHEST x-ray 04/08/2013.  FINDINGS: Lung volumes are low. Minimal bibasilar opacities are favored to reflect subsegmental atelectasis. No definite consolidative airspace disease. No pleural effusions. No evidence of pulmonary edema. Heart size is mildly enlarged. Mediastinal contours are distorted by patient positioning. Atherosclerosis in the thoracic aorta. Status post median sternotomy for CABG.  IMPRESSION: 1.  Low lung volumes with probable bibasilar subsegmental atelectasis. 2. Mild cardiomegaly. 3. Atherosclerosis. 4. Status post median sternotomy for CABG.   Electronically Signed   By: Trudie Reed M.D.   On: 03/30/2013 06:25   Dg Chest Port 1 View  03/29/2013   CLINICAL DATA:  78 year old male with fever, hyperglycemia and tachycardia. Initial encounter.  EXAM: PORTABLE CHEST - 1 VIEW  COMPARISON:  05/28/2012 and earlier.  FINDINGS: Portable AP semi upright view at at 1944 hr. Lower lung volumes. Stable cardiomegaly  and mediastinal contours. Sequelae of CABG. No pneumothorax or pulmonary edema. No consolidation or definite effusions. Mild vascular congestion and perihilar atelectasis. In surveillance  IMPRESSION: Mild vascular congestion and atelectasis. No overt edema.   Electronically Signed   By: Augusto Gamble M.D.   On: 03/29/2013 19:52    EKG Interpretation     Ventricular Rate:  120 PR Interval:  135 QRS Duration: 144 QT Interval:  430 QTC Calculation: 608 R Axis:   15 Text Interpretation:  Sinus tachycardia Right bundle branch block 1st degree av block STE v1/v2, noted on previous but more pronounced             MDM   1. Fever   2. Hyperglycemia   3. Atrial fibrillation   4. Dementia   5. Diabetes mellitus type 2, uncontrolled   6. Sepsis    78 y.o. male w/ PMHx of dementia with DNR paperwork, CAD, A fib presents w/ fever, hyperglycemia. Concerned for severe sepsis given fever, tachycardia, oxygen requirement. Pt with slight cough, concern for pneumonia but given uncertain source at this time vanc, zosyn given after blood cx drawn. Pt with hyperglycemia as well ordered IVF. Pt protecting airway at this time.  CMP with glucose >700, metabolic acidosis. CBC with leukocytosis. Lactate elevated concerning for severe sepsis. Troponin neg. CXR without consolidation. UA without infection. Given abd pain ordered CT abd w/o contrast without acute abdominal finding. Possible lung  infection. Gall bladder sludge present. Bedside US by me without signs of cholecystitis. Called medicine for admission.       I independently viewed, interpreted, and used in my medical decision making all ordered lab and imaging tests. Medical Decision Making discussed with ED attending Dr. Juleen China.      Charm Barges, MD 03/30/13 1549

## 2013-03-29 NOTE — ED Notes (Signed)
CBG checked 469 RN notified

## 2013-03-29 NOTE — ED Notes (Signed)
Triage intered under Erik Obey was completed by Celine Ahr RN

## 2013-03-29 NOTE — ED Notes (Signed)
Pt okay to go to [place] and CT off monitor per MD.

## 2013-03-30 ENCOUNTER — Inpatient Hospital Stay (HOSPITAL_COMMUNITY): Payer: Medicare Other

## 2013-03-30 ENCOUNTER — Encounter (HOSPITAL_COMMUNITY): Payer: Self-pay | Admitting: Internal Medicine

## 2013-03-30 DIAGNOSIS — F0390 Unspecified dementia without behavioral disturbance: Secondary | ICD-10-CM

## 2013-03-30 DIAGNOSIS — A419 Sepsis, unspecified organism: Secondary | ICD-10-CM | POA: Diagnosis present

## 2013-03-30 DIAGNOSIS — I4891 Unspecified atrial fibrillation: Secondary | ICD-10-CM

## 2013-03-30 DIAGNOSIS — E1165 Type 2 diabetes mellitus with hyperglycemia: Secondary | ICD-10-CM | POA: Diagnosis present

## 2013-03-30 LAB — CBC WITH DIFFERENTIAL/PLATELET
Eosinophils Absolute: 0 10*3/uL (ref 0.0–0.7)
HCT: 45.2 % (ref 39.0–52.0)
Hemoglobin: 15.8 g/dL (ref 13.0–17.0)
Lymphocytes Relative: 11 % — ABNORMAL LOW (ref 12–46)
Lymphs Abs: 2.5 10*3/uL (ref 0.7–4.0)
MCV: 94.4 fL (ref 78.0–100.0)
Monocytes Absolute: 2.3 10*3/uL — ABNORMAL HIGH (ref 0.1–1.0)
Monocytes Relative: 10 % (ref 3–12)
Neutro Abs: 17.7 10*3/uL — ABNORMAL HIGH (ref 1.7–7.7)
Platelets: 232 10*3/uL (ref 150–400)
WBC: 22.5 10*3/uL — ABNORMAL HIGH (ref 4.0–10.5)

## 2013-03-30 LAB — GLUCOSE, CAPILLARY
Glucose-Capillary: 137 mg/dL — ABNORMAL HIGH (ref 70–99)
Glucose-Capillary: 164 mg/dL — ABNORMAL HIGH (ref 70–99)
Glucose-Capillary: 171 mg/dL — ABNORMAL HIGH (ref 70–99)
Glucose-Capillary: 218 mg/dL — ABNORMAL HIGH (ref 70–99)
Glucose-Capillary: 250 mg/dL — ABNORMAL HIGH (ref 70–99)
Glucose-Capillary: 284 mg/dL — ABNORMAL HIGH (ref 70–99)
Glucose-Capillary: 353 mg/dL — ABNORMAL HIGH (ref 70–99)

## 2013-03-30 LAB — COMPREHENSIVE METABOLIC PANEL
ALT: 18 U/L (ref 0–53)
AST: 19 U/L (ref 0–37)
Albumin: 3.1 g/dL — ABNORMAL LOW (ref 3.5–5.2)
Alkaline Phosphatase: 71 U/L (ref 39–117)
BUN: 40 mg/dL — ABNORMAL HIGH (ref 6–23)
CO2: 22 mEq/L (ref 19–32)
Calcium: 9.2 mg/dL (ref 8.4–10.5)
Chloride: 114 mEq/L — ABNORMAL HIGH (ref 96–112)
Creatinine, Ser: 0.99 mg/dL (ref 0.50–1.35)
GFR calc Af Amer: 90 mL/min — ABNORMAL LOW (ref >90)
GFR calc non Af Amer: 78 mL/min — ABNORMAL LOW (ref >90)
Glucose, Bld: 179 mg/dL — ABNORMAL HIGH (ref 70–99)
Potassium: 3.7 mEq/L (ref 3.5–5.1)
Sodium: 147 mEq/L — ABNORMAL HIGH (ref 135–145)

## 2013-03-30 LAB — INFLUENZA PANEL BY PCR (TYPE A & B)
H1N1 flu by pcr: NOT DETECTED
Influenza A By PCR: NEGATIVE
Influenza B By PCR: NEGATIVE

## 2013-03-30 LAB — TSH: TSH: 0.909 u[IU]/mL (ref 0.350–4.500)

## 2013-03-30 MED ORDER — DOXAZOSIN MESYLATE 2 MG PO TABS
2.0000 mg | ORAL_TABLET | Freq: Every day | ORAL | Status: DC
  Administered 2013-03-30 – 2013-04-03 (×5): 2 mg via ORAL
  Filled 2013-03-30 (×5): qty 1

## 2013-03-30 MED ORDER — METOPROLOL TARTRATE 25 MG PO TABS
25.0000 mg | ORAL_TABLET | Freq: Two times a day (BID) | ORAL | Status: DC
  Administered 2013-03-30: 25 mg via ORAL
  Filled 2013-03-30 (×2): qty 1

## 2013-03-30 MED ORDER — ENOXAPARIN SODIUM 40 MG/0.4ML ~~LOC~~ SOLN
40.0000 mg | SUBCUTANEOUS | Status: DC
  Administered 2013-03-30: 40 mg via SUBCUTANEOUS
  Filled 2013-03-30: qty 0.4

## 2013-03-30 MED ORDER — INSULIN GLARGINE 100 UNIT/ML ~~LOC~~ SOLN
5.0000 [IU] | Freq: Every day | SUBCUTANEOUS | Status: DC
  Administered 2013-03-30: 5 [IU] via SUBCUTANEOUS
  Filled 2013-03-30: qty 0.05

## 2013-03-30 MED ORDER — SODIUM CHLORIDE 0.9 % IV SOLN: INTRAVENOUS | Status: DC

## 2013-03-30 MED ORDER — SODIUM CHLORIDE 0.9 % IV SOLN
INTRAVENOUS | Status: AC
  Filled 2013-03-30: qty 1

## 2013-03-30 MED ORDER — INSULIN GLARGINE 100 UNIT/ML ~~LOC~~ SOLN
10.0000 [IU] | Freq: Two times a day (BID) | SUBCUTANEOUS | Status: DC
  Administered 2013-03-30 – 2013-04-03 (×8): 10 [IU] via SUBCUTANEOUS
  Filled 2013-03-30 (×9): qty 0.1

## 2013-03-30 MED ORDER — VANCOMYCIN HCL 500 MG IV SOLR
500.0000 mg | Freq: Two times a day (BID) | INTRAVENOUS | Status: DC
  Administered 2013-03-30 – 2013-04-01 (×5): 500 mg via INTRAVENOUS
  Filled 2013-03-30 (×8): qty 500

## 2013-03-30 MED ORDER — ONDANSETRON HCL 4 MG PO TABS: 4.0000 mg | ORAL_TABLET | Freq: Four times a day (QID) | ORAL | Status: DC | PRN

## 2013-03-30 MED ORDER — INSULIN ASPART 100 UNIT/ML ~~LOC~~ SOLN
0.0000 [IU] | Freq: Three times a day (TID) | SUBCUTANEOUS | Status: DC
  Administered 2013-03-30: 7 [IU] via SUBCUTANEOUS
  Administered 2013-03-30: 3 [IU] via SUBCUTANEOUS
  Administered 2013-03-31: 5 [IU] via SUBCUTANEOUS
  Administered 2013-03-31: 1 [IU] via SUBCUTANEOUS
  Administered 2013-03-31: 2 [IU] via SUBCUTANEOUS
  Administered 2013-04-01 – 2013-04-02 (×2): 1 [IU] via SUBCUTANEOUS
  Administered 2013-04-02 – 2013-04-03 (×3): 5 [IU] via SUBCUTANEOUS
  Administered 2013-04-03: 2 [IU] via SUBCUTANEOUS

## 2013-03-30 MED ORDER — CLONIDINE HCL 0.3 MG/24HR TD PTWK
0.3000 mg | MEDICATED_PATCH | TRANSDERMAL | Status: DC
  Administered 2013-03-30: 0.3 mg via TRANSDERMAL
  Filled 2013-03-30: qty 1

## 2013-03-30 MED ORDER — SODIUM CHLORIDE 0.9 % IV SOLN
INTRAVENOUS | Status: DC
  Administered 2013-03-30: 01:00:00 via INTRAVENOUS

## 2013-03-30 MED ORDER — ONDANSETRON HCL 4 MG/2ML IJ SOLN: 4.0000 mg | Freq: Four times a day (QID) | INTRAMUSCULAR | Status: DC | PRN

## 2013-03-30 MED ORDER — SODIUM CHLORIDE 0.9 % IJ SOLN
3.0000 mL | Freq: Two times a day (BID) | INTRAMUSCULAR | Status: DC
  Administered 2013-03-30 – 2013-04-03 (×6): 3 mL via INTRAVENOUS

## 2013-03-30 MED ORDER — ACETAMINOPHEN 650 MG RE SUPP: 650.0000 mg | Freq: Four times a day (QID) | RECTAL | Status: DC | PRN

## 2013-03-30 MED ORDER — ACETAMINOPHEN 325 MG PO TABS
650.0000 mg | ORAL_TABLET | Freq: Four times a day (QID) | ORAL | Status: DC | PRN
  Administered 2013-03-31: 650 mg via ORAL
  Filled 2013-03-30: qty 2

## 2013-03-30 MED ORDER — DEXTROSE 50 % IV SOLN: 25.0000 mL | INTRAVENOUS | Status: DC | PRN

## 2013-03-30 MED ORDER — DEXTROSE-NACL 5-0.45 % IV SOLN
INTRAVENOUS | Status: DC
  Administered 2013-03-30: 05:00:00 via INTRAVENOUS

## 2013-03-30 MED ORDER — LORAZEPAM 0.5 MG PO TABS
0.5000 mg | ORAL_TABLET | Freq: Four times a day (QID) | ORAL | Status: DC | PRN
  Administered 2013-03-31: 0.5 mg via ORAL
  Filled 2013-03-30: qty 1

## 2013-03-30 MED ORDER — PNEUMOCOCCAL VAC POLYVALENT 25 MCG/0.5ML IJ INJ
0.5000 mL | INJECTION | INTRAMUSCULAR | Status: AC
  Administered 2013-04-01: 0.5 mL via INTRAMUSCULAR
  Filled 2013-03-30 (×2): qty 0.5

## 2013-03-30 MED ORDER — INSULIN REGULAR BOLUS VIA INFUSION
0.0000 [IU] | Freq: Three times a day (TID) | INTRAVENOUS | Status: DC
  Filled 2013-03-30: qty 10

## 2013-03-30 MED ORDER — METOPROLOL TARTRATE 50 MG PO TABS
50.0000 mg | ORAL_TABLET | Freq: Two times a day (BID) | ORAL | Status: DC
  Administered 2013-03-30 – 2013-04-03 (×8): 50 mg via ORAL
  Filled 2013-03-30 (×9): qty 1

## 2013-03-30 MED ORDER — SODIUM CHLORIDE 0.9 % IV SOLN
INTRAVENOUS | Status: DC
  Administered 2013-03-30: 100 mL/h via INTRAVENOUS
  Administered 2013-03-31 – 2013-04-01 (×3): via INTRAVENOUS

## 2013-03-30 MED ORDER — BIOTENE DRY MOUTH MT LIQD
15.0000 mL | Freq: Two times a day (BID) | OROMUCOSAL | Status: DC
  Administered 2013-03-30: 15 mL via OROMUCOSAL

## 2013-03-30 MED ORDER — RIVASTIGMINE 9.5 MG/24HR TD PT24
9.5000 mg | MEDICATED_PATCH | Freq: Every day | TRANSDERMAL | Status: DC
  Administered 2013-03-30 – 2013-04-03 (×5): 9.5 mg via TRANSDERMAL
  Filled 2013-03-30 (×5): qty 1

## 2013-03-30 MED ORDER — INSULIN ASPART 100 UNIT/ML ~~LOC~~ SOLN
0.0000 [IU] | Freq: Every day | SUBCUTANEOUS | Status: DC
  Administered 2013-03-30: 3 [IU] via SUBCUTANEOUS

## 2013-03-30 NOTE — ED Notes (Signed)
Bed has been ready for a long time but pt had to go to [place] and CT prior to going to the floor.  Report given to receiving RN.

## 2013-03-30 NOTE — Progress Notes (Signed)
Utilization Review Completed.Luisa Louk T11/07/2012  

## 2013-03-30 NOTE — H&P (Signed)
[facility] History and Physical  Dennis Meadows ZOX:[id number] DOB: November 03, 1934 DOA: 03/29/2013  Referring physician: ER physician. PCP: Florentina Jenny, MD   Chief Complaint: Fever and confusion.  HPI: Dennis Meadows is a 78 y.o. male with history of diabetes mellitus type 2, dementia, atrial fibrillation not on Coumadin secondary to history of cerebral bleed was brought in to the ER because of patient was found to be febrile and confused. In the ER patient was found to be having temperature 101F with leukocytosis. Patient is not provide any history probably secondary to his dementia. On exam patient was found to have mild abdominal tenderness in the left quadrant for which did not contrast CT was done which did not show any acute except for possible infiltrate in his left lung. Patient has been admitted for possible pneumonia. On my exam patient is alert awake and oriented to his name and follows commands. Otherwise patient is not in acute distress. CT head did not show anything acute. Sonogram of the abdomen was done to make sure the patient did not have acute cholecystitis and sonogram did not show any features concerning for cholecystitis.  Review of Systems: As presented in the history of presenting illness, rest negative.  Past Medical History  Diagnosis Date  . Dementia   . DNR (do not resuscitate)   . Cerebral hemorrhage   . Hypertension   . Depression   . Diabetes mellitus   . BPH (benign prostatic hypertrophy)   . Coronary artery disease   . Atrial fibrillation   . Vitamin B 12 deficiency   . C2 cervical fracture   . Former smoker    Past Surgical History  Procedure Laterality Date  . Coronary artery bypass graft     Social History:  reports that he has quit smoking. His smoking use included Cigarettes. He smoked 0.00 packs per day. He does not have any smokeless tobacco history on file. He reports that he does not drink alcohol or use illicit drugs. Where does patient  live nursing home. Can patient participate in ADLs? Not sure.  No Known Allergies  Family History: History reviewed. No pertinent family history.    Prior to Admission medications   Medication Sig Start Date End Date Taking? Authorizing Provider  acetaminophen (TYLENOL) 500 MG tablet Take 500 mg by mouth every 4 (four) hours as needed. For pain   Yes Historical Provider, MD  aluminum-magnesium hydroxide 200-200 MG/5ML suspension Take 30 mLs by mouth every 6 (six) hours as needed. For indigestion   Yes Historical Provider, MD  cloNIDine (CATAPRES - DOSED IN MG/24 HR) 0.3 mg/24hr patch Place 1 patch onto the skin once a week. Unsure day of week, SNF did not have on MAR   Yes Historical Provider, MD  guaiFENesin (ROBITUSSIN) 100 MG/5ML liquid Take 200 mg by mouth 4 (four) times daily as needed. For cough   Yes Historical Provider, MD  loperamide (IMODIUM A-D) 2 MG tablet Take 2 mg by mouth as needed. For diarrhea   Yes Historical Provider, MD  LORazepam (ATIVAN) 0.5 MG tablet Take 0.5 mg by mouth every 6 (six) hours as needed for anxiety.   Yes Historical Provider, MD  magnesium hydroxide (MILK OF MAGNESIA) 400 MG/5ML suspension Take 30 mLs by mouth at bedtime as needed. For constipation   Yes Historical Provider, MD  mirtazapine (REMERON) 15 MG tablet Take 15 mg by mouth at bedtime.   Yes Historical Provider, MD  aspirin EC 81 MG tablet Take 81 mg by  mouth daily.    Historical Provider, MD  doxazosin (CARDURA) 2 MG tablet Take 2 mg by mouth daily.    Historical Provider, MD  glipiZIDE (GLUCOTROL) 5 MG tablet Take 5 mg by mouth daily.    Historical Provider, MD  lisinopril (PRINIVIL,ZESTRIL) 5 MG tablet Take 2 tablets (10 mg total) by mouth daily. 09/07/11 09/06/12  Laveda Norman, MD  metoprolol tartrate (LOPRESSOR) 25 MG tablet Take 25 mg by mouth 2 (two) times daily.    Historical Provider, MD  ranitidine (ZANTAC) 150 MG capsule Take 150 mg by mouth daily.    Historical Provider, MD  rivastigmine  (EXELON) 9.5 mg/24hr Place 1 patch onto the skin daily.    Historical Provider, MD  sertraline (ZOLOFT) 25 MG tablet Take 25 mg by mouth at bedtime.    Historical Provider, MD  vitamin B-12 (CYANOCOBALAMIN) 1000 MCG tablet Take 1,000 mcg by mouth daily.    Historical Provider, MD    Physical Exam: Filed Vitals:   03/29/13 2215 03/29/13 2230 03/29/13 2245 03/30/13 0003  BP: 115/74 110/56 109/73 117/74  Pulse: 105 64  107  Temp:      TempSrc:      Resp: 15 16 17  34  Height:      Weight:      SpO2: 93% 90%  96%     General:  Well-developed and nourished.  Eyes: Anicteric no pallor.  ENT: No discharge from ears eyes nose mouth.  Neck: No mass felt.  Cardiovascular: S1-S2 heard.  Respiratory: No rhonchi or crepitations.  Abdomen: Soft nontender bowel sounds present.  Skin: No rash.  Musculoskeletal: No edema.  Psychiatric: Patient is alert and awake oriented to his name.  Neurologic: Follows commands and moves all extremities.  Labs on Admission:  Basic Metabolic Panel:  Recent Labs Lab 03/29/13 1910  NA 137  K 4.9  CL 100  CO2 19  GLUCOSE 721*  BUN 44*  CREATININE 1.43*  CALCIUM 9.6   Liver Function Tests:  Recent Labs Lab 03/29/13 1910  AST 11  ALT 18  ALKPHOS 83  BILITOT 0.6  PROT 7.1  ALBUMIN 3.3*   No results found for this basename: LIPASE, AMYLASE,  in the last 168 hours No results found for this basename: AMMONIA,  in the last 168 hours CBC:  Recent Labs Lab 03/29/13 1910  WBC 21.4*  NEUTROABS 17.5*  HGB 17.3*  HCT 49.5  MCV 95.4  PLT 252   Cardiac Enzymes: No results found for this basename: CKTOTAL, CKMB, CKMBINDEX, TROPONINI,  in the last 168 hours  BNP (last 3 results) No results found for this basename: PROBNP,  in the last 8760 hours CBG:  Recent Labs Lab 03/29/13 1906 03/29/13 2119 03/29/13 2233 03/30/13 0002  GLUCAP >600* 469* 447* 353*    Radiological Exams on Admission: Ct Abdomen Pelvis Wo  Contrast  03/29/2013   CLINICAL DATA:  78 year old male with altered mental status, hyperglycemia, diabetes. Suspected sepsis. Initial encounter.  EXAM: CT ABDOMEN AND PELVIS WITHOUT CONTRAST  TECHNIQUE: Multidetector CT imaging of the abdomen and pelvis was performed following the standard protocol without intravenous contrast.  COMPARISON:  None.  FINDINGS: Cardiomegaly. Extensive Aortoiliac calcified atherosclerosis noted. Extensive bilateral proximal femoral calcified atherosclerosis. Extensive coronary artery calcified atherosclerosis.  No pericardial effusion. Trace pleural effusions. Patchy opacity in the left lower lobe costophrenic angle, favor atelectasis.  Chronic T12 compression fracture with associated kyphosis. No acute osseous abnormality identified.  Foley catheter in the bladder which is largely  decompressed, containing only trace air and fluid. No pelvic free fluid.  Small volume of fluid in the rectum which otherwise appears negative. Redundant sigmoid colon. Proximal sigmoid diverticulosis which continues into the descending colon, but no definite active diverticulitis. Negative transverse colon. Mild diverticulosis at the hepatic flexure. Negative right colon. No appendix identified. Negative terminal ileum. No dilated small bowel.  Negative noncontrast stomach, duodenum, liver (except for occasional calcified granulomas) spleen, pancreas, and adrenal glands.  The gallbladder is isodense (series 2, image 32. It is difficult to exclude gallbladder wall thickening although no overt pericholecystic inflammation is identified. No biliary ductal enlargement.  Negative right kidney and ureter. Negative left ureter. Left kidney is remarkable for a lobulated partially exophytic low-density lesion measuring about 3 cm. Densitometry favors a benign cyst although there is subtle evidence of some septations within. No perinephric stranding. No abdominal free fluid. No lymphadenopathy.  IMPRESSION: 1. Trace  pleural effusions and mild patchy opacity in the left lower lobe, favor atelectasis rather than early pneumonia.  2. Isodense gallbladder suggesting sludge or stones. Still, no strong CT evidence of acute cholecystitis. Right upper quadrant ultrasound may be confirmatory.  3. No other acute or inflammatory process identified in the abdomen or pelvis.  4. Other findings including: Diverticulosis. Advanced calcified atherosclerosis. Mildly exophytic and complex but probably benign left renal cystic lesion.   Electronically Signed   By: Augusto Gamble M.D.   On: 03/29/2013 21:28   Ct Head Wo Contrast  03/30/2013   CLINICAL DATA:  78 year old male with altered mental status. History of previous C2 cervical fracture.  EXAM: CT HEAD WITHOUT CONTRAST  TECHNIQUE: Contiguous axial images were obtained from the base of the skull through the vertex without intravenous contrast.  COMPARISON:  None.  FINDINGS: Calcified atherosclerosis at the skull base. Calcified atherosclerosis of scalp vasculature also.  Mild motion artifact. Opacified right mastoid air cells. Mild ethmoid sinus mucosal thickening. Other Visualized paranasal sinuses and mastoids are clear.  Calvarium appears intact. Abnormal appearance of the visible C2 vertebra (series 3, image 1) compatible with previous fracture but not otherwise characterized.  Chronic appearing left inferior cerebellar, PICA territory, infarct. Cerebral white matter periventricular hypodensity. Occasional small areas of cortical encephalomalacia in the cerebral hemispheres (right frontal lobe on series 2, image 27). No midline shift, mass effect, or evidence of intracranial mass lesion. No acute intracranial hemorrhage identified. No definite acute cortically based infarct. No suspicious intracranial vascular hyperdensity. No ventriculomegaly.  IMPRESSION: 1. Chronic small and medium-sized vessel ischemia, most pronounced in the left cerebellum. No definite acute intracranial abnormality.   2. Extensive calcified atherosclerosis, suggesting the possibility of chronic renal failure.  3.  C2 fracture partially visible, reportedly chronic.  4.  Right mastoid effusion.   Electronically Signed   By: Augusto Gamble M.D.   On: 03/30/2013 00:01   US Abdomen Complete  03/29/2013   CLINICAL DATA:  78 year old male with altered mental status and suspected sepsis. Increased density of the gallbladder on CT.  EXAM: ULTRASOUND ABDOMEN COMPLETE  COMPARISON:  None contrast CT Abdomen and Pelvis 2106 hr the same day.  FINDINGS: Gallbladder  The gallbladder wall appears normal in thickness, 2 mm. There is evidence of dependent and some tumefactive sludge in the gallbladder. No shadowing echogenic gallstones identified. No sonographic Murphy's sign elicited.  Common bile duct  Diameter: 6 mm, within normal limits. Visualization of the CBD is somewhat limited by overlying bowel gas.  Liver  Increased echogenicity compatible with steatosis. Heterogeneity felt  related to steatosis. No discrete liver mass identified.  IVC  Not visualized due to overlying bowel gas.  Pancreas  Not visualized due to overlying bowel gas.  Spleen  Not visualized due to overlying bowel gas, decreased acoustic window.  Right Kidney  Length: 11.0 cm. Echogenicity within normal limits. No mass or hydronephrosis visualized.  Left Kidney  Length: 10.5 cm. Exophytic lesion as seen on CT appears to be a simple cyst by ultrasound (images 56 and 59). Echogenicity within normal limits. No mass or hydronephrosis visualized.  Abdominal aorta  Incompletely visualized due to overlying bowel gas, visualized portions within normal limits.  IMPRESSION: 1. Gallbladder sludge but no evidence of acute cholecystitis. No definite gallstone.  2.  Heterogeneous liver, favor related to hepatic steatosis.  3. Left kidney exophytic lesion as seen on the earlier CT appears to be a benign cyst.  4. Some abdominal visceral not well visualized by ultrasound due to overlying  bowel gas.   Electronically Signed   By: Augusto Gamble M.D.   On: 03/29/2013 23:47   Dg Chest Port 1 View  03/29/2013   CLINICAL DATA:  78 year old male with fever, hyperglycemia and tachycardia. Initial encounter.  EXAM: PORTABLE CHEST - 1 VIEW  COMPARISON:  05/28/2012 and earlier.  FINDINGS: Portable AP semi upright view at at 1944 hr. Lower lung volumes. Stable cardiomegaly and mediastinal contours. Sequelae of CABG. No pneumothorax or pulmonary edema. No consolidation or definite effusions. Mild vascular congestion and perihilar atelectasis. In surveillance  IMPRESSION: Mild vascular congestion and atelectasis. No overt edema.   Electronically Signed   By: Augusto Gamble M.D.   On: 03/29/2013 19:52    EKG: Independently reviewed. Sinus tachycardia with ST-T changes comparable to old EKG.  Assessment/Plan Principal Problem:   Sepsis Active Problems:   Atrial fibrillation   Dementia   Diabetes mellitus type 2, uncontrolled   1. Sepsis - possible source could be pneumonia. Cultures and urine cultures have been ordered. Patient has been started on vancomycin and Zosyn which will be continued for now with hydration. Check influenza panel. Recheck chest x-ray in a.m. 2. Acute encephalopathy a patient with dementia - baseline mental status not known. Patient presently does not have any neck rigidity and is following commands. Hydrate and continue antibiotics follow blood cultures and reassess mental status in a.m. and discuss with family when available to know about patient's baseline mental status. At this time holding off patient's Zoloft and Remeron until patient's fever subsides. 3. Uncontrolled diabetes mellitus2 - patient's blood sugar was very elevated on presentation and has been started IV insulin infusion. Continue IV insulin until CBG is less than 250 after which changed to long-acting subcutaneous insulin. 4. Mild renal failure - probably secondary to dehydration. Hydrate and recheck metabolic  panel closely follow intake output. 5. Hypertension - continue home medications. 6. History of dementia - see #2. Patient is on Exelon patch.    Code Status: DO NOT RESUSCITATE.   Disposition Plan: Admit to inpatient.    KAKRAKANDY,ARSHAD N. [facility] Pager [phone].  If 7PM-7AM, please contact night-coverage [email or web address] Password [facility] 03/30/2013, 12:31 AM

## 2013-03-30 NOTE — Progress Notes (Signed)
[facility] Progress Note  TEAM 1 - Stepdown/ICU TEAM   Dennis Meadows ZOX:[id number] DOB: 23-Dec-1934 DOA: 03/29/2013 PCP: Florentina Jenny, MD  Admit HPI / Brief Narrative: 78 y.o. male with history of diabetes mellitus type 2, dementia, atrial fibrillation not on Coumadin secondary to history of cerebral bleed who was brought to the ER after he was found to be febrile and confused.   In the ER patient was found to be having temperature 101F with leukocytosis. On exam patient was found to have mild abdominal tenderness in the left quadrant for which CT was done which did not show any acute findings except for possible infiltrate of the left lung. CT head did not show anything acute. Sonogram of the abdomen did not show any features concerning for cholecystitis.  Assessment/Plan:  LLL Pneumonia - HCAP v/s aspiration event   Sepsis (HR 111, Temp 101, WBC 22.5) in setting of PNA BP stable   Acute encephalopathy in the setting of chronic dementia  HTN  Hx of ICH   Chronic afib not on anticoag   DM  B12 deficiency   CAD s/p CABG  Code Status: NO CODE Family Communication: no family present at time of exam Disposition Plan: SDU  Consultants: none  Procedures: none  Antibiotics: Zosyn 11/2 >> Vanc 11/2 >>  DVT prophylaxis: lovenox  HPI/Subjective: Pt seen for f/u visit.  Objective: Blood pressure 162/77, pulse 100, temperature 98.1 F (36.7 C), temperature source Axillary, resp. rate 29, height 5\' 5"  (1.651 m), weight 65.4 kg (144 lb 2.9 oz), SpO2 87.00%.  Intake/Output Summary (Last 24 hours) at 03/30/13 1136 Last data filed at 03/30/13 0800  Gross per 24 hour  Intake 2787.15 ml  Output   1340 ml  Net 1447.15 ml   Exam: F/U exam completed   Data Reviewed: Basic Metabolic Panel:  Recent Labs Lab 03/29/13 1910 03/30/13 0415  NA 137 147*  K 4.9 3.7  CL 100 114*  CO2 19 22  GLUCOSE 721* 179*  BUN 44* 40*  CREATININE 1.43* 0.99   CALCIUM 9.6 9.2   Liver Function Tests:  Recent Labs Lab 03/29/13 1910 03/30/13 0415  AST 11 19  ALT 18 18  ALKPHOS 83 71  BILITOT 0.6 0.6  PROT 7.1 6.6  ALBUMIN 3.3* 3.1*   No results found for this basename: LIPASE, AMYLASE,  in the last 168 hours No results found for this basename: AMMONIA,  in the last 168 hours CBC:  Recent Labs Lab 03/29/13 1910 03/30/13 0415  WBC 21.4* 22.5*  NEUTROABS 17.5* 17.7*  HGB 17.3* 15.8  HCT 49.5 45.2  MCV 95.4 94.4  PLT 252 232   CBG:  Recent Labs Lab 03/30/13 0401 03/30/13 0457 03/30/13 0556 03/30/13 0657 03/30/13 0803  GLUCAP 171* 137* 164* 161* 190*    Recent Results (from the past 240 hour(s))  MRSA PCR SCREENING     Status: None   Collection Time    03/30/13  1:29 AM      Result Value Range Status   MRSA by PCR NEGATIVE  NEGATIVE Final   Comment:            The GeneXpert MRSA Assay (FDA     approved for NASAL specimens     only), is one component of a     comprehensive MRSA colonization     surveillance program. It is not     intended to diagnose MRSA     infection nor to guide or  monitor treatment for     MRSA infections.     Studies:  Recent x-ray studies have been reviewed in detail by the Attending Physician  Scheduled Meds:  Scheduled Meds: . cloNIDine  0.3 mg Transdermal Weekly  . doxazosin  2 mg Oral Daily  . enoxaparin (LOVENOX) injection  40 mg Subcutaneous Q24H  . insulin aspart  0-5 Units Subcutaneous QHS  . insulin aspart  0-9 Units Subcutaneous TID WC  . insulin glargine  5 Units Subcutaneous Daily  . metoprolol tartrate  25 mg Oral BID  . piperacillin-tazobactam (ZOSYN)  IV  3.375 g Intravenous Q8H  . rivastigmine  9.5 mg Transdermal Daily  . sodium chloride  3 mL Intravenous Q12H  . vancomycin  500 mg Intravenous Q12H    Time spent on care of this patient: 25+ mins   [facility] T  [facility] Office  [phone] Pager - Text Page per Loretha Stapler as per  below:  On-Call/Text Page:      [email or web address]      password [email or web address]  If 7PM-7AM, please contact night-coverage [email or web address] Password [email or web address] 03/30/2013, 11:36 AM   LOS: 1 day

## 2013-03-30 NOTE — Progress Notes (Addendum)
Inpatient Diabetes Program Recommendations  AACE/ADA: New Consensus Statement on Inpatient Glycemic Control (2013)  Target Ranges:  Prepandial:   less than 140 mg/dL      Peak postprandial:   less than 180 mg/dL (1-2 hours)      Critically ill patients:  140 - 180 mg/dL   Reason for Visit: Patient admitted with CBG's greater than 600 mg/dL.    Results for Dennis Meadows, Dennis Meadows (MRN [id number]) as of 03/30/2013 14:13  Ref. Range 03/30/2013 08:03 03/30/2013 12:01  Glucose-Capillary Latest Range: 70-99 mg/dL 409 (H) 811 (H)   Agree with increased Levemir. A1C pending.  May also need to add Novolog meal coverage 3 units tid with meals if patient is eating greater than 50%.  Will follow.  Thanks, Beryl Meager, RN, BC-ADM Inpatient Diabetes Coordinator Pager [phone]

## 2013-03-30 NOTE — Progress Notes (Signed)
Pt O2 sats dropped to mid 80s, increased O2 on [place], no improvement. Placed on venti mask 50% O2 sats came up to 94. [place] notified, awaiting response. Will continue to monitor.

## 2013-03-30 NOTE — Progress Notes (Signed)
Clinical Social Work Department BRIEF PSYCHOSOCIAL ASSESSMENT 03/30/2013  Patient:  Dennis Meadows, Dennis Meadows     Account Number:  [id number]     Admit date:  03/29/2013  Clinical Social Worker:  Leron Croak, CLINICAL SOCIAL WORKER  Date/Time:  03/30/2013 02:27 PM  Referred by:  Physician  Date Referred:  03/30/2013 Referred for  ALF Placement   Other Referral:   Interview type:  Other - See comment Other interview type:     Pt resides at [facility] [phone]    Pt has a adult guardian  Alinda Money  [phone]    PSYCHOSOCIAL DATA Living Status:  FACILITY Admitted from facility:  OTHER Level of care:  Assisted Living Primary support name:  Alinda Money  [phone] Primary support relationship to patient:  NONE Degree of support available:   Pt has a guardian and has some minimal supports, however does have placement in a facility.    CURRENT CONCERNS Current Concerns  Post-Acute Placement   Other Concerns:    SOCIAL WORK ASSESSMENT / PLAN CSW spoke with the Pt at the bedside. Pt is alert to self. CSW introduced self and reason for visit. Pt replied to CSW questions by nodding head to symbolize yes answers. Pt validated that he is from [facility] and that he is in agreement to returning to facility. Pt does however have limited orientation and CSW contacted facility to verify placement and that the Pt could return. Admssions coordinator at facility stated that she is in agreement to Pt returning, however has concerns that when the Pt was at the facility Pt was non compliant to medication and medical interventions. MD at facility had made the decision to discontinue the medications and management due to Pt non compliance. Pt admissions stated that she is leary that Pt will continue medical interventions once d/c'd but agree to allow Pt to return at d/c.    CSW also attempted to contact Mr. Sofie Rower to discuss d/c planning back to facility. Mr. Sofie Rower not available  and CSW left a message concerning purpose for the call and that if he had any objections to Pt returning or any questions that he could contact CSW. (Contact infor given). CSW will move forward with preparing information and d/c planning back to facility.   Assessment/plan status:  Information/Referral to [facility] Other assessment/ plan:   Information/referral to community resources:   No additional information needed at this time.    PATIENT'S/FAMILY'S RESPONSE TO PLAN OF CARE: CSW will work on d/c planning.   Leron Croak LCSWA  [facility]

## 2013-03-31 ENCOUNTER — Inpatient Hospital Stay (HOSPITAL_COMMUNITY): Payer: Medicare Other

## 2013-03-31 DIAGNOSIS — J189 Pneumonia, unspecified organism: Secondary | ICD-10-CM | POA: Diagnosis present

## 2013-03-31 DIAGNOSIS — I10 Essential (primary) hypertension: Secondary | ICD-10-CM

## 2013-03-31 LAB — BASIC METABOLIC PANEL
BUN: 25 mg/dL — ABNORMAL HIGH (ref 6–23)
Calcium: 8.4 mg/dL (ref 8.4–10.5)
GFR calc Af Amer: >90 mL/min (ref >90)
GFR calc non Af Amer: 85 mL/min — ABNORMAL LOW (ref >90)
Glucose, Bld: 230 mg/dL — ABNORMAL HIGH (ref 70–99)
Sodium: 145 mEq/L (ref 135–145)

## 2013-03-31 LAB — GLUCOSE, CAPILLARY: Glucose-Capillary: 111 mg/dL — ABNORMAL HIGH (ref 70–99)

## 2013-03-31 LAB — URINE CULTURE

## 2013-03-31 LAB — CBC
MCH: 32.6 pg (ref 26.0–34.0)
MCHC: 34.2 g/dL (ref 30.0–36.0)
Platelets: 167 10*3/uL (ref 150–400)
RBC: 4.2 MIL/uL — ABNORMAL LOW (ref 4.22–5.81)

## 2013-03-31 MED ORDER — BIOTENE DRY MOUTH MT LIQD
15.0000 mL | Freq: Two times a day (BID) | OROMUCOSAL | Status: DC
  Administered 2013-03-31 – 2013-04-02 (×5): 15 mL via OROMUCOSAL

## 2013-03-31 MED ORDER — CHLORHEXIDINE GLUCONATE 0.12 % MT SOLN
15.0000 mL | Freq: Two times a day (BID) | OROMUCOSAL | Status: DC
  Administered 2013-03-31 – 2013-04-02 (×5): 15 mL via OROMUCOSAL
  Filled 2013-03-31 (×10): qty 15

## 2013-03-31 MED ORDER — MAGNESIUM CITRATE PO SOLN
1.0000 | Freq: Once | ORAL | Status: AC
  Administered 2013-03-31: 1 via ORAL
  Filled 2013-03-31: qty 296

## 2013-03-31 MED ORDER — HALOPERIDOL LACTATE 5 MG/ML IJ SOLN
2.5000 mg | Freq: Once | INTRAMUSCULAR | Status: AC
  Administered 2013-03-31: 2.5 mg via INTRAVENOUS
  Filled 2013-03-31: qty 1

## 2013-03-31 MED ORDER — BISACODYL 10 MG RE SUPP
10.0000 mg | Freq: Once | RECTAL | Status: AC
  Administered 2013-03-31: 10 mg via RECTAL

## 2013-03-31 MED ORDER — GLUCERNA SHAKE PO LIQD
237.0000 mL | Freq: Three times a day (TID) | ORAL | Status: DC
  Administered 2013-04-01 – 2013-04-03 (×6): 237 mL via ORAL

## 2013-03-31 MED ORDER — FLEET ENEMA 7-19 GM/118ML RE ENEM
1.0000 | ENEMA | Freq: Once | RECTAL | Status: AC
  Administered 2013-03-31: 1 via RECTAL
  Filled 2013-03-31: qty 1

## 2013-03-31 NOTE — Progress Notes (Signed)
INITIAL NUTRITION ASSESSMENT  DOCUMENTATION CODES Per approved criteria  -Not Applicable   INTERVENTION:  Glucerna Shake PO TID, each supplement provides 220 kcal and 10 grams of protein.  NUTRITION DIAGNOSIS: Inadequate oral intake related to poor appetite and altered mental status as evidenced by 25% meal completion.   Goal: Intake to meet >90% of estimated nutrition needs.  Monitor:  TF tolerance/adequacy, weight trend, labs, vent status.  Reason for Assessment: MST=5  78 y.o. male  Admitting Dx: Sepsis  ASSESSMENT: Patient was brought to the ER on 11/2 because he was found to be febrile and confused. In the ER patient was found to have a temperature of 101F with leukocytosis.   Visited patient, but he was unable to provide any nutrition history. Per RN, patient is eating very poorly; consuming </=25% of meals.   Patient is at nutrition risk, given current decreased oral intake with areas of mild, moderate, and severe depletion of muscle mass.  Nutrition Focused Physical Exam:  Subcutaneous Fat:  Orbital Region: WNL Upper Arm Region: WNL Thoracic and Lumbar Region: NA  Muscle:  Temple Region: mild-moderate depletion Clavicle Bone Region: severe depletion Clavicle and Acromion Bone Region: mild-moderate depletion Scapular Bone Region: NA Dorsal Hand: WNL Patellar Region: mild-moderate depletion Anterior Thigh Region: mild-moderate depletion Posterior Calf Region: mild-moderate depletion  Edema: none   Height: Ht Readings from Last 1 Encounters:  03/29/13 5\' 5"  (1.651 m)    Weight: Wt Readings from Last 1 Encounters:  03/31/13 148 lb 9.4 oz (67.4 kg)    Ideal Body Weight: 61.8 kg  % Ideal Body Weight: 109%  Wt Readings from Last 10 Encounters:  03/31/13 148 lb 9.4 oz (67.4 kg)  09/05/11 144 lb 10 oz (65.6 kg)    Usual Body Weight: 144 lb (1.5 years ago)  % Usual Body Weight: 103%  BMI:  Body mass index is 24.73 kg/(m^2).  Estimated  Nutritional Needs: Kcal: 1600-1800 Protein: 90-100 gm Fluid: 1.6-1.8 L  Skin: no wounds  Diet Order: Carb Control  EDUCATION NEEDS: -Education not appropriate at this time   Intake/Output Summary (Last 24 hours) at 03/31/13 1359 Last data filed at 03/31/13 1129  Gross per 24 hour  Intake   1800 ml  Output   1225 ml  Net    575 ml    Last BM: unknown   Labs:   Recent Labs Lab 03/29/13 1910 03/30/13 0415 03/31/13 0430  NA 137 147* 145  K 4.9 3.7 3.6  CL 100 114* 113*  CO2 19 22 24   BUN 44* 40* 25*  CREATININE 1.43* 0.99 0.78  CALCIUM 9.6 9.2 8.4  GLUCOSE 721* 179* 230*    CBG (last 3)   Recent Labs  03/30/13 1201 03/30/13 1701 03/30/13 2128  GLUCAP 320* 250* 259*    Scheduled Meds: . antiseptic oral rinse  15 mL Mouth Rinse q12n4p  . chlorhexidine  15 mL Mouth Rinse BID  . cloNIDine  0.3 mg Transdermal Weekly  . doxazosin  2 mg Oral Daily  . insulin aspart  0-5 Units Subcutaneous QHS  . insulin aspart  0-9 Units Subcutaneous TID WC  . insulin glargine  10 Units Subcutaneous BID  . metoprolol tartrate  50 mg Oral BID  . piperacillin-tazobactam (ZOSYN)  IV  3.375 g Intravenous Q8H  . pneumococcal 23 valent vaccine  0.5 mL Intramuscular Tomorrow-1000  . rivastigmine  9.5 mg Transdermal Daily  . sodium chloride  3 mL Intravenous Q12H  . vancomycin  500 mg Intravenous  Q12H    Continuous Infusions: . sodium chloride 100 mL/hr at 03/31/13 1043    Past Medical History  Diagnosis Date  . Dementia   . DNR (do not resuscitate)   . Cerebral hemorrhage   . Hypertension   . Depression   . Diabetes mellitus   . BPH (benign prostatic hypertrophy)   . Coronary artery disease   . Atrial fibrillation   . Vitamin B 12 deficiency   . C2 cervical fracture   . Former smoker     Past Surgical History  Procedure Laterality Date  . Coronary artery bypass graft      Joaquin Courts, RD, LDN, CNSC Pager [phone] After Hours Pager [phone]

## 2013-03-31 NOTE — Progress Notes (Signed)
[facility] Progress Note [place] TEAM 1 - Stepdown/ICU TEAM   Dennis Meadows YNW:[id number] DOB: 09/27/1934 DOA: 03/29/2013 PCP: Florentina Jenny, MD  Admit HPI / Brief Narrative: 78 y.o. male with history of diabetes mellitus type 2, dementia, atrial fibrillation not on Coumadin secondary to history of cerebral bleed who was brought to the ER after he was found to be febrile and confused.   In the ER patient was found to be having temperature 101F with leukocytosis. On exam patient was found to have mild abdominal tenderness in the left quadrant for which CT was done which did not show any acute findings except for possible infiltrate of the left lung. CT head did not show anything acute. Sonogram of the abdomen did not show any features concerning for cholecystitis.  Assessment/Plan:  LLL Pneumonia - HCAP v/s aspiration event  -Cont Vanc and Zosyn - not requiring O2 but Pulse ox does drop to 80s when he moves around  Sepsis (HR 111, Temp 101, WBC 22.5) in setting of PNA BP stable   Acute encephalopathy in the setting of chronic dementia - improving but not resolved  HTN - stable  Hx of ICH   Chronic afib not on anticoag  -stable  DM  B12 deficiency   CAD s/p CABG  Code Status: NO CODE Family Communication: no family present at time of exam Disposition Plan: transfer out of SDU  Consultants: none  Procedures: none  Antibiotics: Zosyn 11/2 >> Vanc 11/2 >>  DVT prophylaxis: lovenox  HPI/Subjective: Pt getting out of bed constantly- per RN c/o constipation to her  Objective: Blood pressure 104/57, pulse 69, temperature 98.1 F (36.7 C), temperature source Oral, resp. rate 14, height 5\' 5"  (1.651 m), weight 67.4 kg (148 lb 9.4 oz), SpO2 98.00%.  Intake/Output Summary (Last 24 hours) at 03/31/13 1424 Last data filed at 03/31/13 1129  Gross per 24 hour  Intake   1800 ml  Output   1225 ml  Net    575 ml   Exam: General: Well-developed and  nourished.  Eyes: Anicteric no pallor.  ENT: No discharge from ears eyes nose mouth.  Neck: No mass felt.  Cardiovascular: S1-S2 heard.  Respiratory: No rhonchi or crepitations.  Abdomen: Soft nontender bowel sounds present.  Skin: No rash.  Musculoskeletal: No edema.  Psychiatric: Patient is alert and awake oriented to his name.  Neurologic: Follows commands and moves all extremities.   Data Reviewed: Basic Metabolic Panel:  Recent Labs Lab 03/29/13 1910 03/30/13 0415 03/31/13 0430  NA 137 147* 145  K 4.9 3.7 3.6  CL 100 114* 113*  CO2 19 22 24   GLUCOSE 721* 179* 230*  BUN 44* 40* 25*  CREATININE 1.43* 0.99 0.78  CALCIUM 9.6 9.2 8.4   Liver Function Tests:  Recent Labs Lab 03/29/13 1910 03/30/13 0415  AST 11 19  ALT 18 18  ALKPHOS 83 71  BILITOT 0.6 0.6  PROT 7.1 6.6  ALBUMIN 3.3* 3.1*   No results found for this basename: LIPASE, AMYLASE,  in the last 168 hours No results found for this basename: AMMONIA,  in the last 168 hours CBC:  Recent Labs Lab 03/29/13 1910 03/30/13 0415 03/31/13 0430  WBC 21.4* 22.5* 16.9*  NEUTROABS 17.5* 17.7*  --   HGB 17.3* 15.8 13.7  HCT 49.5 45.2 40.1  MCV 95.4 94.4 95.5  PLT 252 232 167   CBG:  Recent Labs Lab 03/30/13 0657 03/30/13 0803 03/30/13 1201 03/30/13 1701 03/30/13 2128  GLUCAP  161* 190* 320* 250* 259*    Recent Results (from the past 240 hour(s))  CULTURE, BLOOD (ROUTINE X 2)     Status: None   Collection Time    03/29/13  7:00 PM      Result Value Range Status   Specimen Description BLOOD LEFT HAND   Final   Special Requests BOTTLES DRAWN AEROBIC ONLY 10CC   Final   Culture  Setup Time     Final   Value: 03/30/2013 00:43     Performed at [facility]   Culture     Final   Value:        BLOOD CULTURE RECEIVED NO GROWTH TO DATE CULTURE WILL BE HELD FOR 5 DAYS BEFORE ISSUING A FINAL NEGATIVE REPORT     Performed at [facility]   Report Status PENDING   Incomplete  CULTURE,  BLOOD (ROUTINE X 2)     Status: None   Collection Time    03/29/13  7:10 PM      Result Value Range Status   Specimen Description BLOOD RIGHT ANTECUBITAL   Final   Special Requests BOTTLES DRAWN AEROBIC AND ANAEROBIC 10CC   Final   Culture  Setup Time     Final   Value: 03/30/2013 00:43     Performed at [facility]   Culture     Final   Value:        BLOOD CULTURE RECEIVED NO GROWTH TO DATE CULTURE WILL BE HELD FOR 5 DAYS BEFORE ISSUING A FINAL NEGATIVE REPORT     Performed at [facility]   Report Status PENDING   Incomplete  URINE CULTURE     Status: None   Collection Time    03/29/13  8:20 PM      Result Value Range Status   Specimen Description URINE, CATHETERIZED   Final   Special Requests NONE   Final   Culture  Setup Time     Final   Value: 03/29/2013 22:59     Performed at [facility] Count     Final   Value: NO GROWTH     Performed at [facility]   Culture     Final   Value: NO GROWTH     Performed at [facility]   Report Status 03/31/2013 FINAL   Final  MRSA PCR SCREENING     Status: None   Collection Time    03/30/13  1:29 AM      Result Value Range Status   MRSA by PCR NEGATIVE  NEGATIVE Final   Comment:            The GeneXpert MRSA Assay (FDA     approved for NASAL specimens     only), is one component of a     comprehensive MRSA colonization     surveillance program. It is not     intended to diagnose MRSA     infection nor to guide or     monitor treatment for     MRSA infections.     Studies:  Recent x-ray studies have been reviewed in detail by the Attending Physician  Scheduled Meds:  Scheduled Meds: . antiseptic oral rinse  15 mL Mouth Rinse q12n4p  . chlorhexidine  15 mL Mouth Rinse BID  . cloNIDine  0.3 mg Transdermal Weekly  . doxazosin  2 mg Oral Daily  . feeding supplement (GLUCERNA SHAKE)  237 mL  Oral TID BM  . insulin aspart  0-5 Units Subcutaneous QHS  . insulin aspart  0-9  Units Subcutaneous TID WC  . insulin glargine  10 Units Subcutaneous BID  . metoprolol tartrate  50 mg Oral BID  . piperacillin-tazobactam (ZOSYN)  IV  3.375 g Intravenous Q8H  . pneumococcal 23 valent vaccine  0.5 mL Intramuscular Tomorrow-1000  . rivastigmine  9.5 mg Transdermal Daily  . sodium chloride  3 mL Intravenous Q12H  . vancomycin  500 mg Intravenous Q12H    Time spent on care of this patient: 25+ mins   Calvert Cantor, MD  [facility] Office  [phone] Pager - Text Page per Loretha Stapler as per below:  On-Call/Text Page:      [email or web address]      password [email or web address]  If 7PM-7AM, please contact night-coverage [email or web address] Password [email or web address] 03/31/2013, 2:24 PM   LOS: 2 days

## 2013-03-31 NOTE — Progress Notes (Signed)
Inpatient Diabetes Program Recommendations  AACE/ADA: New Consensus Statement on Inpatient Glycemic Control (2013)  Target Ranges:  Prepandial:   less than 140 mg/dL      Peak postprandial:   less than 180 mg/dL (1-2 hours)      Critically ill patients:  140 - 180 mg/dL  Results for BAILEE, THALL (MRN [id number]) as of 03/31/2013 13:33  Ref. Range 03/29/2013 19:10 03/30/2013 04:15 03/31/2013 04:30  Hemoglobin A1C Latest Range: <5.7 %  12.4 (H)   Glucose Latest Range: 70-99 mg/dL 469 (HH) 629 (H) 528 (H)   Results for STRIDER, VALLANCE (MRN [id number]) as of 03/31/2013 13:33  Ref. Range 03/30/2013 06:57 03/30/2013 08:03 03/30/2013 12:01 03/30/2013 17:01 03/30/2013 21:28  Glucose-Capillary Latest Range: 70-99 mg/dL 272 (H) 536 (H) 644 (H) 250 (H) 259 (H)    Inpatient Diabetes Program Recommendations Correction (SSI): Please consider increasing Novolog correction to moderate scale. Insulin - Meal Coverage: Please consider ordering Novolog 3 units TID with meals for meal coverage.  Thanks, Orlando Penner, RN, MSN, CCRN Diabetes Coordinator Inpatient Diabetes Program [phone] (Team Pager) [phone] ([facility] office) [phone] [facility] office)

## 2013-03-31 NOTE — Progress Notes (Signed)
Report called pt transferring to [id number] via w/c.

## 2013-04-01 DIAGNOSIS — E119 Type 2 diabetes mellitus without complications: Secondary | ICD-10-CM

## 2013-04-01 DIAGNOSIS — R509 Fever, unspecified: Secondary | ICD-10-CM

## 2013-04-01 LAB — GLUCOSE, CAPILLARY
Glucose-Capillary: 95 mg/dL (ref 70–99)
Glucose-Capillary: 126 mg/dL — ABNORMAL HIGH (ref 70–99)

## 2013-04-01 LAB — CBC
MCH: 33.3 pg (ref 26.0–34.0)
MCHC: 35.2 g/dL (ref 30.0–36.0)
MCV: 94.5 fL (ref 78.0–100.0)
Platelets: 157 10*3/uL (ref 150–400)
RBC: 4.21 MIL/uL — ABNORMAL LOW (ref 4.22–5.81)
RDW: 13.3 % (ref 11.5–15.5)

## 2013-04-01 LAB — BASIC METABOLIC PANEL
BUN: 17 mg/dL (ref 6–23)
CO2: 23 mEq/L (ref 19–32)
Calcium: 8.4 mg/dL (ref 8.4–10.5)
Creatinine, Ser: 0.64 mg/dL (ref 0.50–1.35)
Glucose, Bld: 100 mg/dL — ABNORMAL HIGH (ref 70–99)
Sodium: 141 mEq/L (ref 135–145)

## 2013-04-01 MED ORDER — POTASSIUM CHLORIDE 10 MEQ/100ML IV SOLN
10.0000 meq | INTRAVENOUS | Status: DC
  Administered 2013-04-01 (×2): 10 meq via INTRAVENOUS
  Filled 2013-04-01 (×6): qty 100

## 2013-04-01 MED ORDER — POTASSIUM CHLORIDE CRYS ER 20 MEQ PO TBCR
40.0000 meq | EXTENDED_RELEASE_TABLET | Freq: Two times a day (BID) | ORAL | Status: AC
  Administered 2013-04-01 (×2): 40 meq via ORAL
  Filled 2013-04-01 (×3): qty 2

## 2013-04-01 NOTE — Progress Notes (Signed)
Patient appears agitated this AM, noticed picking at his foley catheter. Foley catheter appears to be leaking. Reason for [facility] reassess and FC discontinued. Patient able to void on his own. Urine appears reddened but clear, no clots or sediments present. Dr. Catha Gosselin aware. Will continue to monitor.  Sim Boast, RN 04/01/13 0800

## 2013-04-01 NOTE — Evaluation (Signed)
Physical Therapy Evaluation Patient Details Name: Dennis Meadows MRN: [id number] DOB: 03/02/1935 Today's Date: 04/01/2013 Time: 4098-1191 PT Time Calculation (min): 13 min  PT Assessment / Plan / Recommendation History of Present Illness  pt presents with PNA, Sepsis, and Encephalopathy.    Clinical Impression  Pt with baseline cognitive deficits and no family present to determine baseline.  Pt at this point is requiring higher level of A at D/C and will need SNF.      PT Assessment  Patient needs continued PT services    Follow Up Recommendations  SNF    Does the patient have the potential to tolerate intense rehabilitation      Barriers to Discharge        Equipment Recommendations  Rolling walker with 5" wheels    Recommendations for Other Services     Frequency Min 2X/week    Precautions / Restrictions Precautions Precautions: Fall Restrictions Weight Bearing Restrictions: No   Pertinent Vitals/Pain Denied pain.        Mobility  Bed Mobility Bed Mobility: Supine to Sit;Sitting - Scoot to [place] of Bed;Sit to Supine Supine to Sit: 4: Min assist Sitting - Scoot to [place] of Bed: 2: Max assist Sit to Supine: 4: Min assist Details for Bed Mobility Assistance: pt needs hand over hand to initiate mobility, but then only required MinA to complete.   Transfers Transfers: Not assessed Ambulation/Gait Ambulation/Gait Assistance: Not tested (comment) Stairs: No Wheelchair Mobility Wheelchair Mobility: No    Exercises     PT Diagnosis: Difficulty walking;Generalized weakness  PT Problem List: Decreased strength;Decreased activity tolerance;Decreased balance;Decreased mobility;Decreased knowledge of use of DME;Decreased cognition;Decreased safety awareness PT Treatment Interventions: DME instruction;Gait training;Functional mobility training;Therapeutic activities;Therapeutic exercise;Balance training;Neuromuscular re-education;Patient/family education     PT  Goals(Current goals can be found in the care plan section) Acute Rehab PT Goals Patient Stated Goal: None stated.   PT Goal Formulation: Patient unable to participate in goal setting Time For Goal Achievement: 04/15/13 Potential to Achieve Goals: Fair  Visit Information  Last PT Received On: 04/01/13 Assistance Needed: +2 History of Present Illness: pt presents with PNA, Sepsis, and Encephalopathy.         Prior Functioning  Home Living Family/patient expects to be discharged to:: Skilled nursing facility Additional Comments: pt from ALF, but will need higher level of care at D/C.   Prior Function Comments: Unclear baseline level of A.   Communication Communication: No difficulties    Cognition  Cognition Arousal/Alertness: Lethargic Behavior During Therapy: Flat affect Overall Cognitive Status: Difficult to assess Difficult to assess due to: Level of arousal    Extremity/Trunk Assessment Upper Extremity Assessment Upper Extremity Assessment: Generalized weakness Lower Extremity Assessment Lower Extremity Assessment: Generalized weakness   Balance Balance Balance Assessed: Yes Static Sitting Balance Static Sitting - Balance Support: Bilateral upper extremity supported;Feet supported Static Sitting - Level of Assistance: 5: Stand by assistance Static Sitting - Comment/# of Minutes: pt sat EOB x 5 mins, then began c/o feeling dizzy and needing to lay down.  BP stable.    End of Session PT - End of Session Activity Tolerance: Patient limited by fatigue Patient left: in bed;with call bell/phone within reach;with bed alarm set Nurse Communication: Mobility status  GP     Sunny Schlein, [place] [phone] 04/01/2013, 12:14 PM

## 2013-04-01 NOTE — Progress Notes (Signed)
[facility]                                                                                Patient Demographics  Dennis Meadows, is a 78 y.o. male, DOB - 1934/06/28, JWJ:[id number]  Admit date - 03/29/2013   Admitting Physician Eduard Clos, MD  Outpatient Primary MD for the patient is Florentina Jenny, MD  LOS - 3   Chief Complaint  Patient presents with  . Altered Mental Status  . Hyperglycemia        Assessment & Plan    Principal Problem:   Sepsis Active Problems:   Atrial fibrillation   Dementia   Diabetes mellitus type 2, uncontrolled   Pneumonia  LLL Pneumonia, HCAP v/s aspiration event  -Continue Vanc and Zosyn  -Not requiring O2 but Pulse ox does drop to 80s when he moves around   Sepsis (HR 111, Temp 101, WBC 22.5) in setting of PNA  -Leukocytosis improving, WBC 11.6 -Will continue antibiotics.    Acute encephalopathy in the setting of chronic dementia  -Will continue to monitor, patient still appears to be confused. -Continue excelon   HTN  -Last BP 190/106, medications had not be given. -Continue metoprolol, clonidine, Cardura, and will add on hydralazine PRN  Hx of ICH, stable   Chronic afib not on anticoag  -stable, HR  54-111, will continue metoprolol  DM  -Continue ISS and lantus along with CBG CBG (last 3)  Recent Labs  03/31/13 2141 04/01/13 0638 04/01/13 1130  GLUCAP 111* 95 126*    B12 deficiency  Will obtain B12 level and start supplementation as needed.  Hypokalemia -K 2.9 today.  Will replete and continue to monitor.  CAD s/p CABG,Stable   Code Status: NO CODE  Family Communication: no family present at time of exam  Disposition Plan: Will likely discharge back to [facility] once medically stable.  Consultants:  none   Procedures:  none   DVT prophylaxis:  lovenox   Lab Results  Component Value Date   PLT 157 04/01/2013    Medications  Scheduled Meds: . antiseptic oral rinse  15 mL Mouth  Rinse q12n4p  . chlorhexidine  15 mL Mouth Rinse BID  . cloNIDine  0.3 mg Transdermal Weekly  . doxazosin  2 mg Oral Daily  . feeding supplement (GLUCERNA SHAKE)  237 mL Oral TID BM  . insulin aspart  0-5 Units Subcutaneous QHS  . insulin aspart  0-9 Units Subcutaneous TID WC  . insulin glargine  10 Units Subcutaneous BID  . metoprolol tartrate  50 mg Oral BID  . piperacillin-tazobactam (ZOSYN)  IV  3.375 g Intravenous Q8H  . pneumococcal 23 valent vaccine  0.5 mL Intramuscular Tomorrow-1000  . potassium chloride  40 mEq Oral BID  . rivastigmine  9.5 mg Transdermal Daily  . sodium chloride  3 mL Intravenous Q12H  . vancomycin  500 mg Intravenous Q12H   Continuous Infusions: . sodium chloride 100 mL/hr at 04/01/13 0329   PRN Meds:.acetaminophen, acetaminophen, LORazepam, ondansetron (ZOFRAN) IV, ondansetron  Antibiotics    Anti-infectives   Start     Dose/Rate Route Frequency Ordered Stop   03/30/13 2012  vancomycin (  VANCOCIN) IVPB 1000 mg/200 mL premix  Status:  Discontinued     1,000 mg 200 mL/hr over 60 Minutes Intravenous Every 24 hours 03/29/13 2032 03/30/13 1055   03/30/13 1200  vancomycin (VANCOCIN) 500 mg in sodium chloride 0.9 % 100 mL IVPB     500 mg 100 mL/hr over 60 Minutes Intravenous Every 12 hours 03/30/13 1055     03/30/13 0800  vancomycin (VANCOCIN) IVPB 1000 mg/200 mL premix  Status:  Discontinued     1,000 mg 200 mL/hr over 60 Minutes Intravenous Every 12 hours 03/29/13 1931 03/29/13 2032   03/30/13 0600  piperacillin-tazobactam (ZOSYN) IVPB 3.375 g     3.375 g 12.5 mL/hr over 240 Minutes Intravenous 3 times per day 03/29/13 1931     03/29/13 2000  vancomycin (VANCOCIN) IVPB 1000 mg/200 mL premix  Status:  Discontinued     1,000 mg 200 mL/hr over 60 Minutes Intravenous Every 12 hours 03/29/13 1930 03/29/13 2032   03/29/13 2000  piperacillin-tazobactam (ZOSYN) IVPB 3.375 g     3.375 g 100 mL/hr over 30 Minutes Intravenous  Once 03/29/13 1930 03/29/13 2100    03/29/13 1900  piperacillin-tazobactam (ZOSYN) IVPB 3.375 g  Status:  Discontinued     3.375 g 100 mL/hr over 30 Minutes Intravenous  Once 03/29/13 1857 03/29/13 1929   03/29/13 1900  vancomycin (VANCOCIN) IVPB 1000 mg/200 mL premix  Status:  Discontinued     1,000 mg 200 mL/hr over 60 Minutes Intravenous  Once 03/29/13 1857 03/29/13 1930       Time Spent in minutes   30 minutes   Estellar Cadena D.O. on 04/01/2013 at 12:32 PM  Between 7am to 7pm - Pager - [phone]  After 7pm go to [email or web address] - password [email or web address]  And look for the night coverage person covering for me after hours  [facility] Office  [phone]    Subjective:   Dennis Meadows seen and examined today.  Currently sleeping and very interactive.    Objective:   Filed Vitals:   03/31/13 1644 03/31/13 2144 04/01/13 0553 04/01/13 0912  BP: 148/68 144/63 141/74 190/106  Pulse: 78 81 100 111  Temp: 97.9 F (36.6 C) 99.5 F (37.5 C) 97.4 F (36.3 C) 98.6 F (37 C)  TempSrc: Oral Oral Axillary Oral  Resp: 18 22 22 22   Height: 5\' 5"  (1.651 m)     Weight: 69.219 kg (152 lb 9.6 oz)     SpO2: 95% 100% 93% 97%    Wt Readings from Last 3 Encounters:  03/31/13 69.219 kg (152 lb 9.6 oz)  09/05/11 65.6 kg (144 lb 10 oz)     Intake/Output Summary (Last 24 hours) at 04/01/13 1232 Last data filed at 04/01/13 1008  Gross per 24 hour  Intake   1310 ml  Output      0 ml  Net   1310 ml    Exam  General: Well developed, well nourished, NAD, appears stated age  HEENT: NCAT, PERRLA, EOMI, Anicteic Sclera, mucous membranes moist. No pharyngeal erythema or exudates  Neck: Supple, no JVD, no masses  Cardiovascular: S1 S2 auscultated, tachycardic  Respiratory: Clear to auscultation bilaterally with equal chest rise  Abdomen: Soft, nontender, nondistended, + bowel sounds  Extremities: warm dry without cyanosis clubbing or edema  Neuro: Unable to assess at this time.  Skin: Without rashes  exudates or nodules  Psych: Unable to assess at this time.  Data Review   Micro Results Recent Results (from the past  240 hour(s))  CULTURE, BLOOD (ROUTINE X 2)     Status: None   Collection Time    03/29/13  7:00 PM      Result Value Range Status   Specimen Description BLOOD LEFT HAND   Final   Special Requests BOTTLES DRAWN AEROBIC ONLY 10CC   Final   Culture  Setup Time     Final   Value: 03/30/2013 00:43     Performed at [facility]   Culture     Final   Value:        BLOOD CULTURE RECEIVED NO GROWTH TO DATE CULTURE WILL BE HELD FOR 5 DAYS BEFORE ISSUING A FINAL NEGATIVE REPORT     Performed at [facility]   Report Status PENDING   Incomplete  CULTURE, BLOOD (ROUTINE X 2)     Status: None   Collection Time    03/29/13  7:10 PM      Result Value Range Status   Specimen Description BLOOD RIGHT ANTECUBITAL   Final   Special Requests BOTTLES DRAWN AEROBIC AND ANAEROBIC 10CC   Final   Culture  Setup Time     Final   Value: 03/30/2013 00:43     Performed at [facility]   Culture     Final   Value:        BLOOD CULTURE RECEIVED NO GROWTH TO DATE CULTURE WILL BE HELD FOR 5 DAYS BEFORE ISSUING A FINAL NEGATIVE REPORT     Performed at [facility]   Report Status PENDING   Incomplete  URINE CULTURE     Status: None   Collection Time    03/29/13  8:20 PM      Result Value Range Status   Specimen Description URINE, CATHETERIZED   Final   Special Requests NONE   Final   Culture  Setup Time     Final   Value: 03/29/2013 22:59     Performed at [facility] Count     Final   Value: NO GROWTH     Performed at [facility]   Culture     Final   Value: NO GROWTH     Performed at [facility]   Report Status 03/31/2013 FINAL   Final  MRSA PCR SCREENING     Status: None   Collection Time    03/30/13  1:29 AM      Result Value Range Status   MRSA by PCR NEGATIVE  NEGATIVE Final   Comment:            The  GeneXpert MRSA Assay (FDA     approved for NASAL specimens     only), is one component of a     comprehensive MRSA colonization     surveillance program. It is not     intended to diagnose MRSA     infection nor to guide or     monitor treatment for     MRSA infections.    Radiology Reports Ct Abdomen Pelvis Wo Contrast  03/29/2013   CLINICAL DATA:  78 year old male with altered mental status, hyperglycemia, diabetes. Suspected sepsis. Initial encounter.  EXAM: CT ABDOMEN AND PELVIS WITHOUT CONTRAST  TECHNIQUE: Multidetector CT imaging of the abdomen and pelvis was performed following the standard protocol without intravenous contrast.  COMPARISON:  None.  FINDINGS: Cardiomegaly. Extensive Aortoiliac calcified atherosclerosis noted. Extensive bilateral proximal femoral calcified atherosclerosis. Extensive coronary artery calcified atherosclerosis.  No  pericardial effusion. Trace pleural effusions. Patchy opacity in the left lower lobe costophrenic angle, favor atelectasis.  Chronic T12 compression fracture with associated kyphosis. No acute osseous abnormality identified.  Foley catheter in the bladder which is largely decompressed, containing only trace air and fluid. No pelvic free fluid.  Small volume of fluid in the rectum which otherwise appears negative. Redundant sigmoid colon. Proximal sigmoid diverticulosis which continues into the descending colon, but no definite active diverticulitis. Negative transverse colon. Mild diverticulosis at the hepatic flexure. Negative right colon. No appendix identified. Negative terminal ileum. No dilated small bowel.  Negative noncontrast stomach, duodenum, liver (except for occasional calcified granulomas) spleen, pancreas, and adrenal glands.  The gallbladder is isodense (series 2, image 32. It is difficult to exclude gallbladder wall thickening although no overt pericholecystic inflammation is identified. No biliary ductal enlargement.  Negative right kidney  and ureter. Negative left ureter. Left kidney is remarkable for a lobulated partially exophytic low-density lesion measuring about 3 cm. Densitometry favors a benign cyst although there is subtle evidence of some septations within. No perinephric stranding. No abdominal free fluid. No lymphadenopathy.  IMPRESSION: 1. Trace pleural effusions and mild patchy opacity in the left lower lobe, favor atelectasis rather than early pneumonia.  2. Isodense gallbladder suggesting sludge or stones. Still, no strong CT evidence of acute cholecystitis. Right upper quadrant ultrasound may be confirmatory.  3. No other acute or inflammatory process identified in the abdomen or pelvis.  4. Other findings including: Diverticulosis. Advanced calcified atherosclerosis. Mildly exophytic and complex but probably benign left renal cystic lesion.   Electronically Signed   By: Augusto Gamble M.D.   On: 03/29/2013 21:28   Ct Head Wo Contrast  03/30/2013   CLINICAL DATA:  78 year old male with altered mental status. History of previous C2 cervical fracture.  EXAM: CT HEAD WITHOUT CONTRAST  TECHNIQUE: Contiguous axial images were obtained from the base of the skull through the vertex without intravenous contrast.  COMPARISON:  None.  FINDINGS: Calcified atherosclerosis at the skull base. Calcified atherosclerosis of scalp vasculature also.  Mild motion artifact. Opacified right mastoid air cells. Mild ethmoid sinus mucosal thickening. Other Visualized paranasal sinuses and mastoids are clear.  Calvarium appears intact. Abnormal appearance of the visible C2 vertebra (series 3, image 1) compatible with previous fracture but not otherwise characterized.  Chronic appearing left inferior cerebellar, PICA territory, infarct. Cerebral white matter periventricular hypodensity. Occasional small areas of cortical encephalomalacia in the cerebral hemispheres (right frontal lobe on series 2, image 27). No midline shift, mass effect, or evidence of  intracranial mass lesion. No acute intracranial hemorrhage identified. No definite acute cortically based infarct. No suspicious intracranial vascular hyperdensity. No ventriculomegaly.  IMPRESSION: 1. Chronic small and medium-sized vessel ischemia, most pronounced in the left cerebellum. No definite acute intracranial abnormality.  2. Extensive calcified atherosclerosis, suggesting the possibility of chronic renal failure.  3.  C2 fracture partially visible, reportedly chronic.  4.  Right mastoid effusion.   Electronically Signed   By: Augusto Gamble M.D.   On: 03/30/2013 00:01   Ct Cervical Spine Wo Contrast  03/30/2013   CLINICAL DATA:  Evaluate C2 fracture.  EXAM: CT CERVICAL SPINE WITHOUT CONTRAST  TECHNIQUE: Multidetector CT imaging of the cervical spine was performed without intravenous contrast. Multiplanar CT image reconstructions were also generated.  COMPARISON:  CT head 03/29/2013  FINDINGS: Type 3 fracture the dens appears chronic and healed. 6 mm anterior displacement of the dens relative to the body of C2. Posterior  arch of C1 also is displaced anteriorly related to the displaced fracture the dens. This is causing spinal stenosis at the C1-2 level. The spinal cord is not adequately evaluated on CT. No acute fracture.  Cervical disc degeneration and mild spondylosis C3 through C7. Cervical facet degeneration also present in the cervical spine. No mass lesion.  Advanced atherosclerotic disease throughout the carotid artery and great vessels bilaterally. Negative for mass or adenopathy in the neck.  Right mastoid sinus effusion. Right middle ear effusion.  IMPRESSION: Chronic healed fracture of the dens with 6 mm anterior displacement of the dens and spinal stenosis at C1-2. No acute fracture  Cervical degenerative changes as above.   Electronically Signed   By: Marlan Palau M.D.   On: 03/30/2013 10:02   US Abdomen Complete  03/29/2013   CLINICAL DATA:  78 year old male with altered mental status and  suspected sepsis. Increased density of the gallbladder on CT.  EXAM: ULTRASOUND ABDOMEN COMPLETE  COMPARISON:  None contrast CT Abdomen and Pelvis 2106 hr the same day.  FINDINGS: Gallbladder  The gallbladder wall appears normal in thickness, 2 mm. There is evidence of dependent and some tumefactive sludge in the gallbladder. No shadowing echogenic gallstones identified. No sonographic Murphy's sign elicited.  Common bile duct  Diameter: 6 mm, within normal limits. Visualization of the CBD is somewhat limited by overlying bowel gas.  Liver  Increased echogenicity compatible with steatosis. Heterogeneity felt related to steatosis. No discrete liver mass identified.  IVC  Not visualized due to overlying bowel gas.  Pancreas  Not visualized due to overlying bowel gas.  Spleen  Not visualized due to overlying bowel gas, decreased acoustic window.  Right Kidney  Length: 11.0 cm. Echogenicity within normal limits. No mass or hydronephrosis visualized.  Left Kidney  Length: 10.5 cm. Exophytic lesion as seen on CT appears to be a simple cyst by ultrasound (images 56 and 59). Echogenicity within normal limits. No mass or hydronephrosis visualized.  Abdominal aorta  Incompletely visualized due to overlying bowel gas, visualized portions within normal limits.  IMPRESSION: 1. Gallbladder sludge but no evidence of acute cholecystitis. No definite gallstone.  2.  Heterogeneous liver, favor related to hepatic steatosis.  3. Left kidney exophytic lesion as seen on the earlier CT appears to be a benign cyst.  4. Some abdominal visceral not well visualized by ultrasound due to overlying bowel gas.   Electronically Signed   By: Augusto Gamble M.D.   On: 03/29/2013 23:47   Dg Chest Port 1 View  03/31/2013   CLINICAL DATA:  Left lower lobe infiltrate  EXAM: PORTABLE CHEST - 1 VIEW  COMPARISON:  04/26/2013.  FINDINGS: Mediastinum and hilar structures are normal. Prior CABG. Cardiomegaly. Normal pulmonary vascularity. Mild stable  atelectasis lung bases. No focal bony abnormality.  IMPRESSION: 1. Stable chest x-ray with mild bibasilar atelectasis. 2. Prior CABG. Cardiomegaly. No congestive heart failure .   Electronically Signed   By: Maisie Fus  Register   On: 03/31/2013 07:02   Dg Chest Port 1 View  03/30/2013   CLINICAL DATA:  Evaluate infiltrates.  EXAM: PORTABLE CHEST - 1 VIEW  COMPARISON:  CHEST x-ray 04/08/2013.  FINDINGS: Lung volumes are low. Minimal bibasilar opacities are favored to reflect subsegmental atelectasis. No definite consolidative airspace disease. No pleural effusions. No evidence of pulmonary edema. Heart size is mildly enlarged. Mediastinal contours are distorted by patient positioning. Atherosclerosis in the thoracic aorta. Status post median sternotomy for CABG.  IMPRESSION: 1. Low lung volumes  with probable bibasilar subsegmental atelectasis. 2. Mild cardiomegaly. 3. Atherosclerosis. 4. Status post median sternotomy for CABG.   Electronically Signed   By: Trudie Reed M.D.   On: 03/30/2013 06:25   Dg Chest Port 1 View  03/29/2013   CLINICAL DATA:  77 year old male with fever, hyperglycemia and tachycardia. Initial encounter.  EXAM: PORTABLE CHEST - 1 VIEW  COMPARISON:  05/28/2012 and earlier.  FINDINGS: Portable AP semi upright view at at 1944 hr. Lower lung volumes. Stable cardiomegaly and mediastinal contours. Sequelae of CABG. No pneumothorax or pulmonary edema. No consolidation or definite effusions. Mild vascular congestion and perihilar atelectasis. In surveillance  IMPRESSION: Mild vascular congestion and atelectasis. No overt edema.   Electronically Signed   By: Augusto Gamble M.D.   On: 03/29/2013 19:52    CBC  Recent Labs Lab 03/29/13 1910 03/30/13 0415 03/31/13 0430 04/01/13 0600  WBC 21.4* 22.5* 16.9* 11.6*  HGB 17.3* 15.8 13.7 14.0  HCT 49.5 45.2 40.1 39.8  PLT 252 232 167 157  MCV 95.4 94.4 95.5 94.5  MCH 33.3 33.0 32.6 33.3  MCHC 34.9 35.0 34.2 35.2  RDW 13.5 13.7 13.6 13.3   LYMPHSABS 1.1 2.5  --   --   MONOABS 2.8* 2.3*  --   --   EOSABS 0.0 0.0  --   --   BASOSABS 0.0 0.0  --   --     Chemistries   Recent Labs Lab 03/29/13 1910 03/30/13 0415 03/31/13 0430 04/01/13 0600  NA 137 147* 145 141  K 4.9 3.7 3.6 2.9*  CL 100 114* 113* 110  CO2 19 22 24 23   GLUCOSE 721* 179* 230* 100*  BUN 44* 40* 25* 17  CREATININE 1.43* 0.99 0.78 0.64  CALCIUM 9.6 9.2 8.4 8.4  AST 11 19  --   --   ALT 18 18  --   --   ALKPHOS 83 71  --   --   BILITOT 0.6 0.6  --   --    ------------------------------------------------------------------------------------------------------------------ estimated creatinine clearance is 68.3 ml/min (by C-G formula based on Cr of 0.64). ------------------------------------------------------------------------------------------------------------------  Recent Labs  03/30/13 0415  HGBA1C 12.4*   ------------------------------------------------------------------------------------------------------------------ No results found for this basename: CHOL, HDL, LDLCALC, TRIG, CHOLHDL, LDLDIRECT,  in the last 72 hours ------------------------------------------------------------------------------------------------------------------  Recent Labs  03/30/13 0415  TSH 0.909   ------------------------------------------------------------------------------------------------------------------ No results found for this basename: VITAMINB12, FOLATE, FERRITIN, TIBC, IRON, RETICCTPCT,  in the last 72 hours  Coagulation profile No results found for this basename: INR, PROTIME,  in the last 168 hours  No results found for this basename: DDIMER,  in the last 72 hours  Cardiac Enzymes No results found for this basename: CK, CKMB, TROPONINI, MYOGLOBIN,  in the last 168 hours ------------------------------------------------------------------------------------------------------------------ No components found with this basename: POCBNP,

## 2013-04-02 LAB — GLUCOSE, CAPILLARY
Glucose-Capillary: 256 mg/dL — ABNORMAL HIGH (ref 70–99)
Glucose-Capillary: 164 mg/dL — ABNORMAL HIGH (ref 70–99)

## 2013-04-02 LAB — CBC
HCT: 41.9 % (ref 39.0–52.0)
MCHC: 35.3 g/dL (ref 30.0–36.0)
MCV: 93.5 fL (ref 78.0–100.0)
RDW: 13.2 % (ref 11.5–15.5)
WBC: 10.5 10*3/uL (ref 4.0–10.5)

## 2013-04-02 LAB — BASIC METABOLIC PANEL
BUN: 7 mg/dL (ref 6–23)
Chloride: 104 mEq/L (ref 96–112)
Creatinine, Ser: 0.57 mg/dL (ref 0.50–1.35)
GFR calc Af Amer: >90 mL/min (ref >90)
GFR calc non Af Amer: >90 mL/min (ref >90)
Sodium: 137 mEq/L (ref 135–145)

## 2013-04-02 LAB — VANCOMYCIN, TROUGH: Vancomycin Tr: 6.2 ug/mL — ABNORMAL LOW (ref 10.0–20.0)

## 2013-04-02 MED ORDER — DOXAZOSIN MESYLATE 2 MG PO TABS: 2.0000 mg | ORAL_TABLET | Freq: Every day | ORAL | Status: DC

## 2013-04-02 MED ORDER — RIVASTIGMINE 9.5 MG/24HR TD PT24: 9.5000 mg | MEDICATED_PATCH | Freq: Every day | TRANSDERMAL | Status: AC

## 2013-04-02 MED ORDER — VANCOMYCIN HCL IN DEXTROSE 1-5 GM/200ML-% IV SOLN
1000.0000 mg | Freq: Two times a day (BID) | INTRAVENOUS | Status: DC
  Administered 2013-04-02: 1000 mg via INTRAVENOUS
  Filled 2013-04-02 (×2): qty 200

## 2013-04-02 MED ORDER — INSULIN ASPART 100 UNIT/ML ~~LOC~~ SOLN: 0.0000 [IU] | Freq: Three times a day (TID) | SUBCUTANEOUS | Status: DC

## 2013-04-02 MED ORDER — CHLORHEXIDINE GLUCONATE 0.12 % MT SOLN: 15.0000 mL | Freq: Two times a day (BID) | OROMUCOSAL | Status: AC

## 2013-04-02 MED ORDER — LEVOFLOXACIN 750 MG PO TABS: 750.0000 mg | ORAL_TABLET | Freq: Every day | ORAL | Status: AC

## 2013-04-02 MED ORDER — BIOTENE DRY MOUTH MT LIQD: 15.0000 mL | Freq: Two times a day (BID) | OROMUCOSAL | Status: AC

## 2013-04-02 MED ORDER — GLUCERNA SHAKE PO LIQD: 237.0000 mL | Freq: Three times a day (TID) | ORAL | Status: DC

## 2013-04-02 MED ORDER — METOPROLOL TARTRATE 25 MG PO TABS: 25.0000 mg | ORAL_TABLET | Freq: Two times a day (BID) | ORAL | Status: AC

## 2013-04-02 MED ORDER — LEVOFLOXACIN 750 MG PO TABS
750.0000 mg | ORAL_TABLET | Freq: Every day | ORAL | Status: DC
  Administered 2013-04-02 – 2013-04-03 (×2): 750 mg via ORAL
  Filled 2013-04-02 (×3): qty 1

## 2013-04-02 MED ORDER — INSULIN GLARGINE 100 UNIT/ML ~~LOC~~ SOLN: 10.0000 [IU] | Freq: Two times a day (BID) | SUBCUTANEOUS | Status: AC

## 2013-04-02 MED ORDER — CLONIDINE HCL 0.3 MG/24HR TD PTWK: 0.3000 mg | MEDICATED_PATCH | TRANSDERMAL | Status: DC

## 2013-04-02 NOTE — Progress Notes (Signed)
ANTIBIOTIC CONSULT NOTE - FOLLOW UP  Pharmacy Consult for Vancomycin Indication: Sepsis  No Known Allergies  Patient Measurements: Height: 5\' 5"  (165.1 cm) Weight: 152 lb 9.6 oz (69.219 kg) IBW/kg (Calculated) : 61.5 Adjusted Body Weight:    Vital Signs: Temp: 98.9 F (37.2 C) (11/05 2100) Temp src: Oral (11/05 2100) BP: 143/63 mmHg (11/05 2100) Pulse Rate: 70 (11/05 2100) Intake/Output from previous day: 11/05 0701 - 11/06 0700 In: 4092.9 [P.O.:650; I.V.:3042.9; IV Piggyback:400] Out: 700 [Urine:700] Intake/Output from this shift: Total I/O In: 500 [I.V.:450; IV Piggyback:50] Out: -   Labs:  Recent Labs  03/30/13 0415 03/31/13 0430 04/01/13 0600  WBC 22.5* 16.9* 11.6*  HGB 15.8 13.7 14.0  PLT 232 167 157  CREATININE 0.99 0.78 0.64   Estimated Creatinine Clearance: 68.3 ml/min (by C-G formula based on Cr of 0.64).  Recent Labs  04/01/13 2302  VANCOTROUGH 6.2*     Microbiology: Recent Results (from the past 720 hour(s))  CULTURE, BLOOD (ROUTINE X 2)     Status: None   Collection Time    03/29/13  7:00 PM      Result Value Range Status   Specimen Description BLOOD LEFT HAND   Final   Special Requests BOTTLES DRAWN AEROBIC ONLY 10CC   Final   Culture  Setup Time     Final   Value: 03/30/2013 00:43     Performed at [facility]   Culture     Final   Value:        BLOOD CULTURE RECEIVED NO GROWTH TO DATE CULTURE WILL BE HELD FOR 5 DAYS BEFORE ISSUING A FINAL NEGATIVE REPORT     Performed at [facility]   Report Status PENDING   Incomplete  CULTURE, BLOOD (ROUTINE X 2)     Status: None   Collection Time    03/29/13  7:10 PM      Result Value Range Status   Specimen Description BLOOD RIGHT ANTECUBITAL   Final   Special Requests BOTTLES DRAWN AEROBIC AND ANAEROBIC 10CC   Final   Culture  Setup Time     Final   Value: 03/30/2013 00:43     Performed at [facility]   Culture     Final   Value:        BLOOD CULTURE RECEIVED NO  GROWTH TO DATE CULTURE WILL BE HELD FOR 5 DAYS BEFORE ISSUING A FINAL NEGATIVE REPORT     Performed at [facility]   Report Status PENDING   Incomplete  URINE CULTURE     Status: None   Collection Time    03/29/13  8:20 PM      Result Value Range Status   Specimen Description URINE, CATHETERIZED   Final   Special Requests NONE   Final   Culture  Setup Time     Final   Value: 03/29/2013 22:59     Performed at [facility] Count     Final   Value: NO GROWTH     Performed at [facility]   Culture     Final   Value: NO GROWTH     Performed at [facility]   Report Status 03/31/2013 FINAL   Final  MRSA PCR SCREENING     Status: None   Collection Time    03/30/13  1:29 AM      Result Value Range Status   MRSA by PCR NEGATIVE  NEGATIVE Final  Comment:            The GeneXpert MRSA Assay (FDA     approved for NASAL specimens     only), is one component of a     comprehensive MRSA colonization     surveillance program. It is not     intended to diagnose MRSA     infection nor to guide or     monitor treatment for     MRSA infections.    Anti-infectives   Start     Dose/Rate Route Frequency Ordered Stop   03/30/13 2012  vancomycin (VANCOCIN) IVPB 1000 mg/200 mL premix  Status:  Discontinued     1,000 mg 200 mL/hr over 60 Minutes Intravenous Every 24 hours 03/29/13 2032 03/30/13 1055   03/30/13 1200  vancomycin (VANCOCIN) 500 mg in sodium chloride 0.9 % 100 mL IVPB     500 mg 100 mL/hr over 60 Minutes Intravenous Every 12 hours 03/30/13 1055     03/30/13 0800  vancomycin (VANCOCIN) IVPB 1000 mg/200 mL premix  Status:  Discontinued     1,000 mg 200 mL/hr over 60 Minutes Intravenous Every 12 hours 03/29/13 1931 03/29/13 2032   03/30/13 0600  piperacillin-tazobactam (ZOSYN) IVPB 3.375 g     3.375 g 12.5 mL/hr over 240 Minutes Intravenous 3 times per day 03/29/13 1931     03/29/13 2000  vancomycin (VANCOCIN) IVPB 1000 mg/200 mL premix   Status:  Discontinued     1,000 mg 200 mL/hr over 60 Minutes Intravenous Every 12 hours 03/29/13 1930 03/29/13 2032   03/29/13 2000  piperacillin-tazobactam (ZOSYN) IVPB 3.375 g     3.375 g 100 mL/hr over 30 Minutes Intravenous  Once 03/29/13 1930 03/29/13 2100   03/29/13 1900  piperacillin-tazobactam (ZOSYN) IVPB 3.375 g  Status:  Discontinued     3.375 g 100 mL/hr over 30 Minutes Intravenous  Once 03/29/13 1857 03/29/13 1929   03/29/13 1900  vancomycin (VANCOCIN) IVPB 1000 mg/200 mL premix  Status:  Discontinued     1,000 mg 200 mL/hr over 60 Minutes Intravenous  Once 03/29/13 1857 03/29/13 1930      Assessment: Vanc/Zosyn-Rx: R/o sepsis. WBC=22.5>>11.6, Afebrile. Scr down to 0.64. Vanco trough 6.2 significantly <goal. All doses have been charted as given.   Goal of Therapy:  Vancomycin trough level 15-20 mcg/ml  Plan:  Increase Vancomycin to 1g IV 12hrs according to pharmacy dosing protocol.   Karlita Lichtman S. Merilynn Finland, PharmD, [facility] Clinical Staff Pharmacist Pager [phone]  Misty Stanley Stillinger 04/02/2013,1:11 AM

## 2013-04-02 NOTE — Progress Notes (Signed)
[facility]                                                                                Patient Demographics  Dennis Meadows, is a 78 y.o. male, DOB - 12-Feb-1935, UJW:[id number]  Admit date - 03/29/2013   Admitting Physician Eduard Clos, MD  Outpatient Primary MD for the patient is Florentina Jenny, MD  LOS - 4   Chief Complaint  Patient presents with  . Altered Mental Status  . Hyperglycemia        Assessment & Plan    Principal Problem:   Sepsis Active Problems:   Atrial fibrillation   Dementia   Diabetes mellitus type 2, uncontrolled   Pneumonia  LLL Pneumonia, HCAP v/s aspiration event  -Continue Vanc and Zosyn  -Not requiring O2 but Pulse ox does drop to 80s when he moves around   Sepsis (HR 111, Temp 101, WBC 22.5) in setting of PNA  -Leukocytosis improving, WBC 10.5 -Will continue antibiotics.    Acute encephalopathy in the setting of chronic dementia  -Will continue to monitor, patient still appears to be confused. -Continue excelon   HTN  -Continue metoprolol, clonidine, Cardura, and will add on hydralazine PRN  Hx of ICH, stable   Chronic afib not on anticoag  -stable, HR  54-111, will continue metoprolol  DM  -Continue ISS and lantus along with CBG CBG (last 3)  Recent Labs  03/31/13 2141 04/01/13 0638 04/01/13 1130  GLUCAP 111* 95 126*    B12 deficiency  B12 level 788  Hypokalemia -Resolved.   CAD s/p CABG,Stable   Code Status: NO CODE  Family Communication: no family present at time of exam  Disposition Plan: Currently in search of SNF which has to be approved by patient's probation officer.   Consultants:  none   Procedures:  none   DVT prophylaxis:  lovenox   Lab Results  Component Value Date   PLT 192 04/02/2013    Medications  Scheduled Meds: . antiseptic oral rinse  15 mL Mouth Rinse q12n4p  . chlorhexidine  15 mL Mouth Rinse BID  . cloNIDine  0.3 mg Transdermal Weekly  . doxazosin  2 mg Oral  Daily  . feeding supplement (GLUCERNA SHAKE)  237 mL Oral TID BM  . insulin aspart  0-5 Units Subcutaneous QHS  . insulin aspart  0-9 Units Subcutaneous TID WC  . insulin glargine  10 Units Subcutaneous BID  . levofloxacin  750 mg Oral Daily  . metoprolol tartrate  50 mg Oral BID  . rivastigmine  9.5 mg Transdermal Daily  . sodium chloride  3 mL Intravenous Q12H   Continuous Infusions: . sodium chloride 75 mL/hr at 04/01/13 1854   PRN Meds:.acetaminophen, acetaminophen, LORazepam, ondansetron (ZOFRAN) IV, ondansetron  Antibiotics    Anti-infectives   Start     Dose/Rate Route Frequency Ordered Stop   04/02/13 1100  levofloxacin (LEVAQUIN) tablet 750 mg     750 mg Oral Daily 04/02/13 1009     04/02/13 0200  vancomycin (VANCOCIN) IVPB 1000 mg/200 mL premix  Status:  Discontinued     1,000 mg 200 mL/hr over 60 Minutes Intravenous Every 12 hours 04/02/13  0114 04/02/13 1009   04/02/13 0000  levofloxacin (LEVAQUIN) 750 MG tablet     750 mg Oral Daily 04/02/13 1012 04/06/13 2359   03/30/13 2012  vancomycin (VANCOCIN) IVPB 1000 mg/200 mL premix  Status:  Discontinued     1,000 mg 200 mL/hr over 60 Minutes Intravenous Every 24 hours 03/29/13 2032 03/30/13 1055   03/30/13 1200  vancomycin (VANCOCIN) 500 mg in sodium chloride 0.9 % 100 mL IVPB  Status:  Discontinued     500 mg 100 mL/hr over 60 Minutes Intravenous Every 12 hours 03/30/13 1055 04/02/13 0115   03/30/13 0800  vancomycin (VANCOCIN) IVPB 1000 mg/200 mL premix  Status:  Discontinued     1,000 mg 200 mL/hr over 60 Minutes Intravenous Every 12 hours 03/29/13 1931 03/29/13 2032   03/30/13 0600  piperacillin-tazobactam (ZOSYN) IVPB 3.375 g  Status:  Discontinued     3.375 g 12.5 mL/hr over 240 Minutes Intravenous 3 times per day 03/29/13 1931 04/02/13 1009   03/29/13 2000  vancomycin (VANCOCIN) IVPB 1000 mg/200 mL premix  Status:  Discontinued     1,000 mg 200 mL/hr over 60 Minutes Intravenous Every 12 hours 03/29/13 1930 03/29/13  2032   03/29/13 2000  piperacillin-tazobactam (ZOSYN) IVPB 3.375 g     3.375 g 100 mL/hr over 30 Minutes Intravenous  Once 03/29/13 1930 03/29/13 2100   03/29/13 1900  piperacillin-tazobactam (ZOSYN) IVPB 3.375 g  Status:  Discontinued     3.375 g 100 mL/hr over 30 Minutes Intravenous  Once 03/29/13 1857 03/29/13 1929   03/29/13 1900  vancomycin (VANCOCIN) IVPB 1000 mg/200 mL premix  Status:  Discontinued     1,000 mg 200 mL/hr over 60 Minutes Intravenous  Once 03/29/13 1857 03/29/13 1930       Time Spent in minutes   20 minutes   Acxel Dingee D.O. on 04/02/2013 at 4:16 PM  Between 7am to 7pm - Pager - [phone]  After 7pm go to [email or web address] - password [email or web address]  And look for the night coverage person covering for me after hours  [facility] Office  [phone]    Subjective:   Dennis Meadows seen and examined today.  Has no complaints.    Objective:   Filed Vitals:   04/02/13 0300 04/02/13 0500 04/02/13 0912 04/02/13 1417  BP: 142/68 138/60 118/72 101/72  Pulse: 79 80 79 77  Temp: 98.8 F (37.1 C) 98 F (36.7 C) 98.2 F (36.8 C) 98.9 F (37.2 C)  TempSrc: Oral Oral Oral Oral  Resp: 18 20 20 20   Height:      Weight:      SpO2: 96% 97% 98% 98%    Wt Readings from Last 3 Encounters:  03/31/13 69.219 kg (152 lb 9.6 oz)  09/05/11 65.6 kg (144 lb 10 oz)     Intake/Output Summary (Last 24 hours) at 04/02/13 1616 Last data filed at 04/02/13 0000  Gross per 24 hour  Intake 1061.25 ml  Output    700 ml  Net 361.25 ml    Exam  General: Well developed, well nourished, NAD, appears stated age  HEENT: NCAT, PERRLA, EOMI, Anicteic Sclera, mucous membranes moist. No pharyngeal erythema or exudates  Neck: Supple, no JVD, no masses  Cardiovascular: S1 S2 auscultated, tachycardic  Respiratory: Clear to auscultation bilaterally with equal chest rise  Abdomen: Soft, nontender, nondistended, + bowel sounds  Extremities: warm dry without  cyanosis clubbing or edema  Neuro: Awake and alert, can follow commands.  Skin: Without rashes  exudates or nodules  Psych: Unable to assess at this time.  Data Review   Micro Results Recent Results (from the past 240 hour(s))  CULTURE, BLOOD (ROUTINE X 2)     Status: None   Collection Time    03/29/13  7:00 PM      Result Value Range Status   Specimen Description BLOOD LEFT HAND   Final   Special Requests BOTTLES DRAWN AEROBIC ONLY 10CC   Final   Culture  Setup Time     Final   Value: 03/30/2013 00:43     Performed at [facility]   Culture     Final   Value:        BLOOD CULTURE RECEIVED NO GROWTH TO DATE CULTURE WILL BE HELD FOR 5 DAYS BEFORE ISSUING A FINAL NEGATIVE REPORT     Performed at [facility]   Report Status PENDING   Incomplete  CULTURE, BLOOD (ROUTINE X 2)     Status: None   Collection Time    03/29/13  7:10 PM      Result Value Range Status   Specimen Description BLOOD RIGHT ANTECUBITAL   Final   Special Requests BOTTLES DRAWN AEROBIC AND ANAEROBIC 10CC   Final   Culture  Setup Time     Final   Value: 03/30/2013 00:43     Performed at [facility]   Culture     Final   Value:        BLOOD CULTURE RECEIVED NO GROWTH TO DATE CULTURE WILL BE HELD FOR 5 DAYS BEFORE ISSUING A FINAL NEGATIVE REPORT     Performed at [facility]   Report Status PENDING   Incomplete  URINE CULTURE     Status: None   Collection Time    03/29/13  8:20 PM      Result Value Range Status   Specimen Description URINE, CATHETERIZED   Final   Special Requests NONE   Final   Culture  Setup Time     Final   Value: 03/29/2013 22:59     Performed at [facility] Count     Final   Value: NO GROWTH     Performed at [facility]   Culture     Final   Value: NO GROWTH     Performed at [facility]   Report Status 03/31/2013 FINAL   Final  MRSA PCR SCREENING     Status: None   Collection Time    03/30/13  1:29 AM       Result Value Range Status   MRSA by PCR NEGATIVE  NEGATIVE Final   Comment:            The GeneXpert MRSA Assay (FDA     approved for NASAL specimens     only), is one component of a     comprehensive MRSA colonization     surveillance program. It is not     intended to diagnose MRSA     infection nor to guide or     monitor treatment for     MRSA infections.    Radiology Reports Ct Abdomen Pelvis Wo Contrast  03/29/2013   CLINICAL DATA:  78 year old male with altered mental status, hyperglycemia, diabetes. Suspected sepsis. Initial encounter.  EXAM: CT ABDOMEN AND PELVIS WITHOUT CONTRAST  TECHNIQUE: Multidetector CT imaging of the abdomen and pelvis was performed following the standard protocol without intravenous contrast.  COMPARISON:  None.  FINDINGS: Cardiomegaly. Extensive Aortoiliac calcified atherosclerosis noted. Extensive bilateral proximal femoral calcified atherosclerosis. Extensive coronary artery calcified atherosclerosis.  No pericardial effusion. Trace pleural effusions. Patchy opacity in the left lower lobe costophrenic angle, favor atelectasis.  Chronic T12 compression fracture with associated kyphosis. No acute osseous abnormality identified.  Foley catheter in the bladder which is largely decompressed, containing only trace air and fluid. No pelvic free fluid.  Small volume of fluid in the rectum which otherwise appears negative. Redundant sigmoid colon. Proximal sigmoid diverticulosis which continues into the descending colon, but no definite active diverticulitis. Negative transverse colon. Mild diverticulosis at the hepatic flexure. Negative right colon. No appendix identified. Negative terminal ileum. No dilated small bowel.  Negative noncontrast stomach, duodenum, liver (except for occasional calcified granulomas) spleen, pancreas, and adrenal glands.  The gallbladder is isodense (series 2, image 32. It is difficult to exclude gallbladder wall thickening although no overt  pericholecystic inflammation is identified. No biliary ductal enlargement.  Negative right kidney and ureter. Negative left ureter. Left kidney is remarkable for a lobulated partially exophytic low-density lesion measuring about 3 cm. Densitometry favors a benign cyst although there is subtle evidence of some septations within. No perinephric stranding. No abdominal free fluid. No lymphadenopathy.  IMPRESSION: 1. Trace pleural effusions and mild patchy opacity in the left lower lobe, favor atelectasis rather than early pneumonia.  2. Isodense gallbladder suggesting sludge or stones. Still, no strong CT evidence of acute cholecystitis. Right upper quadrant ultrasound may be confirmatory.  3. No other acute or inflammatory process identified in the abdomen or pelvis.  4. Other findings including: Diverticulosis. Advanced calcified atherosclerosis. Mildly exophytic and complex but probably benign left renal cystic lesion.   Electronically Signed   By: Augusto Gamble M.D.   On: 03/29/2013 21:28   Ct Head Wo Contrast  03/30/2013   CLINICAL DATA:  78 year old male with altered mental status. History of previous C2 cervical fracture.  EXAM: CT HEAD WITHOUT CONTRAST  TECHNIQUE: Contiguous axial images were obtained from the base of the skull through the vertex without intravenous contrast.  COMPARISON:  None.  FINDINGS: Calcified atherosclerosis at the skull base. Calcified atherosclerosis of scalp vasculature also.  Mild motion artifact. Opacified right mastoid air cells. Mild ethmoid sinus mucosal thickening. Other Visualized paranasal sinuses and mastoids are clear.  Calvarium appears intact. Abnormal appearance of the visible C2 vertebra (series 3, image 1) compatible with previous fracture but not otherwise characterized.  Chronic appearing left inferior cerebellar, PICA territory, infarct. Cerebral white matter periventricular hypodensity. Occasional small areas of cortical encephalomalacia in the cerebral hemispheres  (right frontal lobe on series 2, image 27). No midline shift, mass effect, or evidence of intracranial mass lesion. No acute intracranial hemorrhage identified. No definite acute cortically based infarct. No suspicious intracranial vascular hyperdensity. No ventriculomegaly.  IMPRESSION: 1. Chronic small and medium-sized vessel ischemia, most pronounced in the left cerebellum. No definite acute intracranial abnormality.  2. Extensive calcified atherosclerosis, suggesting the possibility of chronic renal failure.  3.  C2 fracture partially visible, reportedly chronic.  4.  Right mastoid effusion.   Electronically Signed   By: Augusto Gamble M.D.   On: 03/30/2013 00:01   Ct Cervical Spine Wo Contrast  03/30/2013   CLINICAL DATA:  Evaluate C2 fracture.  EXAM: CT CERVICAL SPINE WITHOUT CONTRAST  TECHNIQUE: Multidetector CT imaging of the cervical spine was performed without intravenous contrast. Multiplanar CT image reconstructions were also generated.  COMPARISON:  CT head 03/29/2013  FINDINGS: Type  3 fracture the dens appears chronic and healed. 6 mm anterior displacement of the dens relative to the body of C2. Posterior arch of C1 also is displaced anteriorly related to the displaced fracture the dens. This is causing spinal stenosis at the C1-2 level. The spinal cord is not adequately evaluated on CT. No acute fracture.  Cervical disc degeneration and mild spondylosis C3 through C7. Cervical facet degeneration also present in the cervical spine. No mass lesion.  Advanced atherosclerotic disease throughout the carotid artery and great vessels bilaterally. Negative for mass or adenopathy in the neck.  Right mastoid sinus effusion. Right middle ear effusion.  IMPRESSION: Chronic healed fracture of the dens with 6 mm anterior displacement of the dens and spinal stenosis at C1-2. No acute fracture  Cervical degenerative changes as above.   Electronically Signed   By: Marlan Palau M.D.   On: 03/30/2013 10:02   US  Abdomen Complete  03/29/2013   CLINICAL DATA:  78 year old male with altered mental status and suspected sepsis. Increased density of the gallbladder on CT.  EXAM: ULTRASOUND ABDOMEN COMPLETE  COMPARISON:  None contrast CT Abdomen and Pelvis 2106 hr the same day.  FINDINGS: Gallbladder  The gallbladder wall appears normal in thickness, 2 mm. There is evidence of dependent and some tumefactive sludge in the gallbladder. No shadowing echogenic gallstones identified. No sonographic Murphy's sign elicited.  Common bile duct  Diameter: 6 mm, within normal limits. Visualization of the CBD is somewhat limited by overlying bowel gas.  Liver  Increased echogenicity compatible with steatosis. Heterogeneity felt related to steatosis. No discrete liver mass identified.  IVC  Not visualized due to overlying bowel gas.  Pancreas  Not visualized due to overlying bowel gas.  Spleen  Not visualized due to overlying bowel gas, decreased acoustic window.  Right Kidney  Length: 11.0 cm. Echogenicity within normal limits. No mass or hydronephrosis visualized.  Left Kidney  Length: 10.5 cm. Exophytic lesion as seen on CT appears to be a simple cyst by ultrasound (images 56 and 59). Echogenicity within normal limits. No mass or hydronephrosis visualized.  Abdominal aorta  Incompletely visualized due to overlying bowel gas, visualized portions within normal limits.  IMPRESSION: 1. Gallbladder sludge but no evidence of acute cholecystitis. No definite gallstone.  2.  Heterogeneous liver, favor related to hepatic steatosis.  3. Left kidney exophytic lesion as seen on the earlier CT appears to be a benign cyst.  4. Some abdominal visceral not well visualized by ultrasound due to overlying bowel gas.   Electronically Signed   By: Augusto Gamble M.D.   On: 03/29/2013 23:47   Dg Chest Port 1 View  03/31/2013   CLINICAL DATA:  Left lower lobe infiltrate  EXAM: PORTABLE CHEST - 1 VIEW  COMPARISON:  04/26/2013.  FINDINGS: Mediastinum and hilar  structures are normal. Prior CABG. Cardiomegaly. Normal pulmonary vascularity. Mild stable atelectasis lung bases. No focal bony abnormality.  IMPRESSION: 1. Stable chest x-ray with mild bibasilar atelectasis. 2. Prior CABG. Cardiomegaly. No congestive heart failure .   Electronically Signed   By: Maisie Fus  Register   On: 03/31/2013 07:02   Dg Chest Port 1 View  03/30/2013   CLINICAL DATA:  Evaluate infiltrates.  EXAM: PORTABLE CHEST - 1 VIEW  COMPARISON:  CHEST x-ray 04/08/2013.  FINDINGS: Lung volumes are low. Minimal bibasilar opacities are favored to reflect subsegmental atelectasis. No definite consolidative airspace disease. No pleural effusions. No evidence of pulmonary edema. Heart size is mildly enlarged. Mediastinal contours  are distorted by patient positioning. Atherosclerosis in the thoracic aorta. Status post median sternotomy for CABG.  IMPRESSION: 1. Low lung volumes with probable bibasilar subsegmental atelectasis. 2. Mild cardiomegaly. 3. Atherosclerosis. 4. Status post median sternotomy for CABG.   Electronically Signed   By: Trudie Reed M.D.   On: 03/30/2013 06:25   Dg Chest Port 1 View  03/29/2013   CLINICAL DATA:  78 year old male with fever, hyperglycemia and tachycardia. Initial encounter.  EXAM: PORTABLE CHEST - 1 VIEW  COMPARISON:  05/28/2012 and earlier.  FINDINGS: Portable AP semi upright view at at 1944 hr. Lower lung volumes. Stable cardiomegaly and mediastinal contours. Sequelae of CABG. No pneumothorax or pulmonary edema. No consolidation or definite effusions. Mild vascular congestion and perihilar atelectasis. In surveillance  IMPRESSION: Mild vascular congestion and atelectasis. No overt edema.   Electronically Signed   By: Augusto Gamble M.D.   On: 03/29/2013 19:52    CBC  Recent Labs Lab 03/29/13 1910 03/30/13 0415 03/31/13 0430 04/01/13 0600 04/02/13 0455  WBC 21.4* 22.5* 16.9* 11.6* 10.5  HGB 17.3* 15.8 13.7 14.0 14.8  HCT 49.5 45.2 40.1 39.8 41.9  PLT 252 232  167 157 192  MCV 95.4 94.4 95.5 94.5 93.5  MCH 33.3 33.0 32.6 33.3 33.0  MCHC 34.9 35.0 34.2 35.2 35.3  RDW 13.5 13.7 13.6 13.3 13.2  LYMPHSABS 1.1 2.5  --   --   --   MONOABS 2.8* 2.3*  --   --   --   EOSABS 0.0 0.0  --   --   --   BASOSABS 0.0 0.0  --   --   --     Chemistries   Recent Labs Lab 03/29/13 1910 03/30/13 0415 03/31/13 0430 04/01/13 0600 04/02/13 0455  NA 137 147* 145 141 137  K 4.9 3.7 3.6 2.9* 3.8  CL 100 114* 113* 110 104  CO2 19 22 24 23 21   GLUCOSE 721* 179* 230* 100* 151*  BUN 44* 40* 25* 17 7  CREATININE 1.43* 0.99 0.78 0.64 0.57  CALCIUM 9.6 9.2 8.4 8.4 8.6  AST 11 19  --   --   --   ALT 18 18  --   --   --   ALKPHOS 83 71  --   --   --   BILITOT 0.6 0.6  --   --   --    ------------------------------------------------------------------------------------------------------------------ estimated creatinine clearance is 68.3 ml/min (by C-G formula based on Cr of 0.57). ------------------------------------------------------------------------------------------------------------------ No results found for this basename: HGBA1C,  in the last 72 hours ------------------------------------------------------------------------------------------------------------------ No results found for this basename: CHOL, HDL, LDLCALC, TRIG, CHOLHDL, LDLDIRECT,  in the last 72 hours ------------------------------------------------------------------------------------------------------------------ No results found for this basename: TSH, T4TOTAL, FREET3, T3FREE, THYROIDAB,  in the last 72 hours ------------------------------------------------------------------------------------------------------------------ No results found for this basename: VITAMINB12, FOLATE, FERRITIN, TIBC, IRON, RETICCTPCT,  in the last 72 hours  Coagulation profile No results found for this basename: INR, PROTIME,  in the last 168 hours  No results found for this basename: DDIMER,  in the last 72  hours  Cardiac Enzymes No results found for this basename: CK, CKMB, TROPONINI, MYOGLOBIN,  in the last 168 hours ------------------------------------------------------------------------------------------------------------------ No components found with this basename: POCBNP,

## 2013-04-02 NOTE — Discharge Summary (Addendum)
Physician Discharge Summary  Dennis Meadows EAV:[id number] DOB: 02-13-1935 DOA: 03/29/2013  PCP: Florentina Jenny, MD  Admit date: 03/29/2013 Discharge date: 04/02/2013  Time spent: 45 minutes  Recommendations for Outpatient Follow-up:  Patient is to followup with primary care physician, Dr. Redmond School, within one week of discharge. He should continue taking medications as prescribed. Patient should also follow up with her primary care physician at the nursing home within one to 2 days of arrival.  Discharge Diagnoses:  Principal Problem:   Sepsis secondary to left lower lobe pneumonia healthcare acquired. Active Problems:   Atrial fibrillation, on no anticoagulation   Dementia   Diabetes mellitus type 2, uncontrolled   Pneumonia   Hypertension   History of ICH   Hypokalemia  Discharge Condition: Stable  Diet recommendation: Heart Healthy  Filed Weights   03/30/13 0410 03/31/13 0438 03/31/13 1644  Weight: 65.4 kg (144 lb 2.9 oz) 67.4 kg (148 lb 9.4 oz) 69.219 kg (152 lb 9.6 oz)    History of present illness:  Dennis Meadows is a 78 y.o. male with history of diabetes mellitus type 2, dementia, atrial fibrillation not on Coumadin secondary to history of cerebral bleed was brought in to the ER because of patient was found to be febrile and confused. In the ER patient was found to be having temperature 101F with leukocytosis. Patient is not provide any history probably secondary to his dementia. On exam patient was found to have mild abdominal tenderness in the left quadrant for which did not contrast CT was done which did not show any acute except for possible infiltrate in his left lung. Patient has been admitted for possible pneumonia. On my exam patient is alert awake and oriented to his name and follows commands. Otherwise patient is not in acute distress. CT head did not show anything acute. Sonogram of the abdomen was done to make sure the patient did not have acute cholecystitis and sonogram  did not show any features concerning for cholecystitis.   Hospital Course:  A 78 year old male with history of type 2 diabetes, dementia, atrial fibrillation on Coumadin therapy secondary to cerebral bleed the emergency department for confusion as well as fever. Patient was identified brought 1 Will leukocytosis. He was admitted with sepsis secondary to healthcare acquired pneumonia. Patient was started on vancomycin as well as Zosyn with pharmacy to dose and follow.  Blood cultures remain negative. Patient will be transitioned to Levaquin 750 mg daily for the next 4 days. On admission patient was noted to have mild abdominal tenderness with left quadrant pain and CT was done however did not show any acute findings except for possible infiltrate at the left lung. CT of the head was also conducted showing no acute findings. Sonogram of the abdomen did not show any features concerning for cholecystitis. Of note patient does not require oxygen however his pulse ox doesn't drop to the 80s when he does move around.  During his hospital course his sepsis did resolve. Patient's blood pressure did remain stable as well as his leukocytosis trending downward.  During his hospital course patient was noted to have one episode of uncontrolled blood pressure however his medications have not yet been given. On day of discharge blood pressure was found to be stable.  Pressors acute encephalopathy, this is thought to be secondary to underlying dementia. He was continued on Exelon. Patient does have chronic atrial fibrillation however is not on Coumadin or any anticoagulation at this time. He did remain out of control metoprolol.  As for his diabetes, patient was placed on insulin sliding scale along with Lantus. His blood sugars did remain stable. Patient was also noted to have hypokalemia, however this was repleted and should continue to be monitored. Patient to followup with his primary care physician, Dr. Redmond School, within one to  2 weeks of discharge. He should also follow up with physician at the nursing home.  Procedures: None  Consultations: None  Discharge Exam: Filed Vitals:   04/02/13 0912  BP: 118/72  Pulse: 79  Temp: 98.2 F (36.8 C)  Resp: 20   Exam  General: Well developed, well nourished, NAD, appears stated age  HEENT: NCAT, mucous membranes moist.  Neck: Supple, no JVD, no masses  Cardiovascular: S1 S2 auscultated, tachycardic  Respiratory: Clear to auscultation bilaterally with equal chest rise  Abdomen: Soft, nontender, nondistended, + bowel sounds  Extremities: warm dry without cyanosis clubbing or edema  Neuro: Awake, alert.  Able to answer some questions and follow commands. Skin: Without rashes exudates or nodules  Psych: Unable to assess at this time.  Discharge Instructions  Discharge Orders   Future Orders Complete By Expires   Diet - low sodium heart healthy  As directed    Discharge instructions  As directed    Comments:     Patient is to followup with primary care physician, Dr. Redmond School, within one week of discharge. He should continue taking medications as prescribed. Patient should also follow up with her primary care physician at the nursing home within one to 2 days of arrival.   Increase activity slowly  As directed        Medication List    STOP taking these medications       guaiFENesin 100 MG/5ML liquid  Commonly known as:  ROBITUSSIN      TAKE these medications       acetaminophen 500 MG tablet  Commonly known as:  TYLENOL  Take 500 mg by mouth every 4 (four) hours as needed. For pain     antiseptic oral rinse Liqd  15 mLs by Mouth Rinse route 2 times daily at 12 noon and 4 pm.     chlorhexidine 0.12 % solution  Commonly known as:  PERIDEX  15 mLs by Mouth Rinse route 2 (two) times daily.     cloNIDine 0.3 mg/24hr patch  Commonly known as:  CATAPRES - Dosed in mg/24 hr  Place 1 patch (0.3 mg total) onto the skin once a week. Unsure day of week,  SNF did not have on MAR     doxazosin 2 MG tablet  Commonly known as:  CARDURA  Take 1 tablet (2 mg total) by mouth daily.     feeding supplement (GLUCERNA SHAKE) Liqd  Take 237 mLs by mouth 3 (three) times daily between meals.     insulin aspart 100 UNIT/ML injection  Commonly known as:  novoLOG  Inject 0-9 Units into the skin 3 (three) times daily with meals.     insulin glargine 100 UNIT/ML injection  Commonly known as:  LANTUS  Inject 0.1 mLs (10 Units total) into the skin 2 (two) times daily.     levofloxacin 750 MG tablet  Commonly known as:  LEVAQUIN  Take 1 tablet (750 mg total) by mouth daily.     loperamide 2 MG tablet  Commonly known as:  IMODIUM A-D  Take 2 mg by mouth as needed. For diarrhea     LORazepam 0.5 MG tablet  Commonly known as:  ATIVAN  Take 0.5 mg by mouth every 6 (six) hours as needed for anxiety.     magnesium hydroxide 400 MG/5ML suspension  Commonly known as:  MILK OF MAGNESIA  Take 30 mLs by mouth at bedtime as needed. For constipation     metoprolol tartrate 25 MG tablet  Commonly known as:  LOPRESSOR  Take 1 tablet (25 mg total) by mouth 2 (two) times daily.     mirtazapine 15 MG tablet  Commonly known as:  REMERON  Take 15 mg by mouth at bedtime.     rivastigmine 9.5 mg/24hr  Commonly known as:  EXELON  Place 1 patch (9.5 mg total) onto the skin daily.       No Known Allergies     Follow-up Information   Follow up with Florentina Jenny, MD. Schedule an appointment as soon as possible for a visit in 1 week.   Specialty:  Family Medicine   Contact information:   [address] [place] [address] [phone]       Follow up with Physician at [facility] In 2 days.       The results of significant diagnostics from this hospitalization (including imaging, microbiology, ancillary and laboratory) are listed below for reference.    Significant Diagnostic Studies: Ct Abdomen Pelvis Wo Contrast  03/29/2013    CLINICAL DATA:  78 year old male with altered mental status, hyperglycemia, diabetes. Suspected sepsis. Initial encounter.  EXAM: CT ABDOMEN AND PELVIS WITHOUT CONTRAST  TECHNIQUE: Multidetector CT imaging of the abdomen and pelvis was performed following the standard protocol without intravenous contrast.  COMPARISON:  None.  FINDINGS: Cardiomegaly. Extensive Aortoiliac calcified atherosclerosis noted. Extensive bilateral proximal femoral calcified atherosclerosis. Extensive coronary artery calcified atherosclerosis.  No pericardial effusion. Trace pleural effusions. Patchy opacity in the left lower lobe costophrenic angle, favor atelectasis.  Chronic T12 compression fracture with associated kyphosis. No acute osseous abnormality identified.  Foley catheter in the bladder which is largely decompressed, containing only trace air and fluid. No pelvic free fluid.  Small volume of fluid in the rectum which otherwise appears negative. Redundant sigmoid colon. Proximal sigmoid diverticulosis which continues into the descending colon, but no definite active diverticulitis. Negative transverse colon. Mild diverticulosis at the hepatic flexure. Negative right colon. No appendix identified. Negative terminal ileum. No dilated small bowel.  Negative noncontrast stomach, duodenum, liver (except for occasional calcified granulomas) spleen, pancreas, and adrenal glands.  The gallbladder is isodense (series 2, image 32. It is difficult to exclude gallbladder wall thickening although no overt pericholecystic inflammation is identified. No biliary ductal enlargement.  Negative right kidney and ureter. Negative left ureter. Left kidney is remarkable for a lobulated partially exophytic low-density lesion measuring about 3 cm. Densitometry favors a benign cyst although there is subtle evidence of some septations within. No perinephric stranding. No abdominal free fluid. No lymphadenopathy.  IMPRESSION: 1. Trace pleural effusions and  mild patchy opacity in the left lower lobe, favor atelectasis rather than early pneumonia.  2. Isodense gallbladder suggesting sludge or stones. Still, no strong CT evidence of acute cholecystitis. Right upper quadrant ultrasound may be confirmatory.  3. No other acute or inflammatory process identified in the abdomen or pelvis.  4. Other findings including: Diverticulosis. Advanced calcified atherosclerosis. Mildly exophytic and complex but probably benign left renal cystic lesion.   Electronically Signed   By: Augusto Gamble M.D.   On: 03/29/2013 21:28   Ct Head Wo Contrast  03/30/2013   CLINICAL DATA:  78 year old male with  altered mental status. History of previous C2 cervical fracture.  EXAM: CT HEAD WITHOUT CONTRAST  TECHNIQUE: Contiguous axial images were obtained from the base of the skull through the vertex without intravenous contrast.  COMPARISON:  None.  FINDINGS: Calcified atherosclerosis at the skull base. Calcified atherosclerosis of scalp vasculature also.  Mild motion artifact. Opacified right mastoid air cells. Mild ethmoid sinus mucosal thickening. Other Visualized paranasal sinuses and mastoids are clear.  Calvarium appears intact. Abnormal appearance of the visible C2 vertebra (series 3, image 1) compatible with previous fracture but not otherwise characterized.  Chronic appearing left inferior cerebellar, PICA territory, infarct. Cerebral white matter periventricular hypodensity. Occasional small areas of cortical encephalomalacia in the cerebral hemispheres (right frontal lobe on series 2, image 27). No midline shift, mass effect, or evidence of intracranial mass lesion. No acute intracranial hemorrhage identified. No definite acute cortically based infarct. No suspicious intracranial vascular hyperdensity. No ventriculomegaly.  IMPRESSION: 1. Chronic small and medium-sized vessel ischemia, most pronounced in the left cerebellum. No definite acute intracranial abnormality.  2. Extensive  calcified atherosclerosis, suggesting the possibility of chronic renal failure.  3.  C2 fracture partially visible, reportedly chronic.  4.  Right mastoid effusion.   Electronically Signed   By: Augusto Gamble M.D.   On: 03/30/2013 00:01   Ct Cervical Spine Wo Contrast  03/30/2013   CLINICAL DATA:  Evaluate C2 fracture.  EXAM: CT CERVICAL SPINE WITHOUT CONTRAST  TECHNIQUE: Multidetector CT imaging of the cervical spine was performed without intravenous contrast. Multiplanar CT image reconstructions were also generated.  COMPARISON:  CT head 03/29/2013  FINDINGS: Type 3 fracture the dens appears chronic and healed. 6 mm anterior displacement of the dens relative to the body of C2. Posterior arch of C1 also is displaced anteriorly related to the displaced fracture the dens. This is causing spinal stenosis at the C1-2 level. The spinal cord is not adequately evaluated on CT. No acute fracture.  Cervical disc degeneration and mild spondylosis C3 through C7. Cervical facet degeneration also present in the cervical spine. No mass lesion.  Advanced atherosclerotic disease throughout the carotid artery and great vessels bilaterally. Negative for mass or adenopathy in the neck.  Right mastoid sinus effusion. Right middle ear effusion.  IMPRESSION: Chronic healed fracture of the dens with 6 mm anterior displacement of the dens and spinal stenosis at C1-2. No acute fracture  Cervical degenerative changes as above.   Electronically Signed   By: Marlan Palau M.D.   On: 03/30/2013 10:02   US Abdomen Complete  03/29/2013   CLINICAL DATA:  78 year old male with altered mental status and suspected sepsis. Increased density of the gallbladder on CT.  EXAM: ULTRASOUND ABDOMEN COMPLETE  COMPARISON:  None contrast CT Abdomen and Pelvis 2106 hr the same day.  FINDINGS: Gallbladder  The gallbladder wall appears normal in thickness, 2 mm. There is evidence of dependent and some tumefactive sludge in the gallbladder. No shadowing  echogenic gallstones identified. No sonographic Murphy's sign elicited.  Common bile duct  Diameter: 6 mm, within normal limits. Visualization of the CBD is somewhat limited by overlying bowel gas.  Liver  Increased echogenicity compatible with steatosis. Heterogeneity felt related to steatosis. No discrete liver mass identified.  IVC  Not visualized due to overlying bowel gas.  Pancreas  Not visualized due to overlying bowel gas.  Spleen  Not visualized due to overlying bowel gas, decreased acoustic window.  Right Kidney  Length: 11.0 cm. Echogenicity within normal limits. No mass or  hydronephrosis visualized.  Left Kidney  Length: 10.5 cm. Exophytic lesion as seen on CT appears to be a simple cyst by ultrasound (images 56 and 59). Echogenicity within normal limits. No mass or hydronephrosis visualized.  Abdominal aorta  Incompletely visualized due to overlying bowel gas, visualized portions within normal limits.  IMPRESSION: 1. Gallbladder sludge but no evidence of acute cholecystitis. No definite gallstone.  2.  Heterogeneous liver, favor related to hepatic steatosis.  3. Left kidney exophytic lesion as seen on the earlier CT appears to be a benign cyst.  4. Some abdominal visceral not well visualized by ultrasound due to overlying bowel gas.   Electronically Signed   By: Augusto Gamble M.D.   On: 03/29/2013 23:47   Dg Chest Port 1 View  03/31/2013   CLINICAL DATA:  Left lower lobe infiltrate  EXAM: PORTABLE CHEST - 1 VIEW  COMPARISON:  04/26/2013.  FINDINGS: Mediastinum and hilar structures are normal. Prior CABG. Cardiomegaly. Normal pulmonary vascularity. Mild stable atelectasis lung bases. No focal bony abnormality.  IMPRESSION: 1. Stable chest x-ray with mild bibasilar atelectasis. 2. Prior CABG. Cardiomegaly. No congestive heart failure .   Electronically Signed   By: Maisie Fus  Register   On: 03/31/2013 07:02   Dg Chest Port 1 View  03/30/2013   CLINICAL DATA:  Evaluate infiltrates.  EXAM: PORTABLE CHEST - 1  VIEW  COMPARISON:  CHEST x-ray 04/08/2013.  FINDINGS: Lung volumes are low. Minimal bibasilar opacities are favored to reflect subsegmental atelectasis. No definite consolidative airspace disease. No pleural effusions. No evidence of pulmonary edema. Heart size is mildly enlarged. Mediastinal contours are distorted by patient positioning. Atherosclerosis in the thoracic aorta. Status post median sternotomy for CABG.  IMPRESSION: 1. Low lung volumes with probable bibasilar subsegmental atelectasis. 2. Mild cardiomegaly. 3. Atherosclerosis. 4. Status post median sternotomy for CABG.   Electronically Signed   By: Trudie Reed M.D.   On: 03/30/2013 06:25   Dg Chest Port 1 View  03/29/2013   CLINICAL DATA:  78 year old male with fever, hyperglycemia and tachycardia. Initial encounter.  EXAM: PORTABLE CHEST - 1 VIEW  COMPARISON:  05/28/2012 and earlier.  FINDINGS: Portable AP semi upright view at at 1944 hr. Lower lung volumes. Stable cardiomegaly and mediastinal contours. Sequelae of CABG. No pneumothorax or pulmonary edema. No consolidation or definite effusions. Mild vascular congestion and perihilar atelectasis. In surveillance  IMPRESSION: Mild vascular congestion and atelectasis. No overt edema.   Electronically Signed   By: Augusto Gamble M.D.   On: 03/29/2013 19:52    Microbiology: Recent Results (from the past 240 hour(s))  CULTURE, BLOOD (ROUTINE X 2)     Status: None   Collection Time    03/29/13  7:00 PM      Result Value Range Status   Specimen Description BLOOD LEFT HAND   Final   Special Requests BOTTLES DRAWN AEROBIC ONLY 10CC   Final   Culture  Setup Time     Final   Value: 03/30/2013 00:43     Performed at [facility]   Culture     Final   Value:        BLOOD CULTURE RECEIVED NO GROWTH TO DATE CULTURE WILL BE HELD FOR 5 DAYS BEFORE ISSUING A FINAL NEGATIVE REPORT     Performed at [facility]   Report Status PENDING   Incomplete  CULTURE, BLOOD (ROUTINE X 2)      Status: None   Collection Time    03/29/13  7:10 PM      Result Value Range Status   Specimen Description BLOOD RIGHT ANTECUBITAL   Final   Special Requests BOTTLES DRAWN AEROBIC AND ANAEROBIC 10CC   Final   Culture  Setup Time     Final   Value: 03/30/2013 00:43     Performed at [facility]   Culture     Final   Value:        BLOOD CULTURE RECEIVED NO GROWTH TO DATE CULTURE WILL BE HELD FOR 5 DAYS BEFORE ISSUING A FINAL NEGATIVE REPORT     Performed at [facility]   Report Status PENDING   Incomplete  URINE CULTURE     Status: None   Collection Time    03/29/13  8:20 PM      Result Value Range Status   Specimen Description URINE, CATHETERIZED   Final   Special Requests NONE   Final   Culture  Setup Time     Final   Value: 03/29/2013 22:59     Performed at [facility] Count     Final   Value: NO GROWTH     Performed at [facility]   Culture     Final   Value: NO GROWTH     Performed at [facility]   Report Status 03/31/2013 FINAL   Final  MRSA PCR SCREENING     Status: None   Collection Time    03/30/13  1:29 AM      Result Value Range Status   MRSA by PCR NEGATIVE  NEGATIVE Final   Comment:            The GeneXpert MRSA Assay (FDA     approved for NASAL specimens     only), is one component of a     comprehensive MRSA colonization     surveillance program. It is not     intended to diagnose MRSA     infection nor to guide or     monitor treatment for     MRSA infections.     Labs: Basic Metabolic Panel:  Recent Labs Lab 03/29/13 1910 03/30/13 0415 03/31/13 0430 04/01/13 0600 04/02/13 0455  NA 137 147* 145 141 137  K 4.9 3.7 3.6 2.9* 3.8  CL 100 114* 113* 110 104  CO2 19 22 24 23 21   GLUCOSE 721* 179* 230* 100* 151*  BUN 44* 40* 25* 17 7  CREATININE 1.43* 0.99 0.78 0.64 0.57  CALCIUM 9.6 9.2 8.4 8.4 8.6   Liver Function Tests:  Recent Labs Lab 03/29/13 1910 03/30/13 0415  AST 11 19  ALT 18 18   ALKPHOS 83 71  BILITOT 0.6 0.6  PROT 7.1 6.6  ALBUMIN 3.3* 3.1*   No results found for this basename: LIPASE, AMYLASE,  in the last 168 hours No results found for this basename: AMMONIA,  in the last 168 hours CBC:  Recent Labs Lab 03/29/13 1910 03/30/13 0415 03/31/13 0430 04/01/13 0600 04/02/13 0455  WBC 21.4* 22.5* 16.9* 11.6* 10.5  NEUTROABS 17.5* 17.7*  --   --   --   HGB 17.3* 15.8 13.7 14.0 14.8  HCT 49.5 45.2 40.1 39.8 41.9  MCV 95.4 94.4 95.5 94.5 93.5  PLT 252 232 167 157 192   Cardiac Enzymes: No results found for this basename: CKTOTAL, CKMB, CKMBINDEX, TROPONINI,  in the last 168 hours BNP: BNP (last 3 results) No results found for this basename:  PROBNP,  in the last 8760 hours CBG:  Recent Labs Lab 04/01/13 0638 04/01/13 1130 04/01/13 1631 04/01/13 2149 04/02/13 0709  GLUCAP 95 126* 108* 111* 146*    Signed:  Lynnet Hefley  [facility] 04/02/2013, 10:12 AM  Discharge was canceled on 04/02/2013 due to placement issues. Patient was seen and evaluated today and is currently stable with no changes from yesterday.

## 2013-04-02 NOTE — Progress Notes (Signed)
ANTIBIOTIC CONSULT NOTE - FOLLOW UP  Pharmacy Consult for levaquin Indication: pneumonia  No Known Allergies  Patient Measurements: Height: 5\' 5"  (165.1 cm) Weight: 152 lb 9.6 oz (69.219 kg) IBW/kg (Calculated) : 61.5  Vital Signs: Temp: 98.2 F (36.8 C) (11/06 0912) Temp src: Oral (11/06 0912) BP: 118/72 mmHg (11/06 0912) Pulse Rate: 79 (11/06 0912) Intake/Output from previous day: 11/05 0701 - 11/06 0700 In: 4092.9 [P.O.:650; I.V.:3042.9; IV Piggyback:400] Out: 700 [Urine:700] Intake/Output from this shift:    Labs:  Recent Labs  03/31/13 0430 04/01/13 0600 04/02/13 0455  WBC 16.9* 11.6* 10.5  HGB 13.7 14.0 14.8  PLT 167 157 192  CREATININE 0.78 0.64 0.57   Estimated Creatinine Clearance: 68.3 ml/min (by C-G formula based on Cr of 0.57).  Recent Labs  04/01/13 2302  VANCOTROUGH 6.2*     Microbiology: Recent Results (from the past 720 hour(s))  CULTURE, BLOOD (ROUTINE X 2)     Status: None   Collection Time    03/29/13  7:00 PM      Result Value Range Status   Specimen Description BLOOD LEFT HAND   Final   Special Requests BOTTLES DRAWN AEROBIC ONLY 10CC   Final   Culture  Setup Time     Final   Value: 03/30/2013 00:43     Performed at [facility]   Culture     Final   Value:        BLOOD CULTURE RECEIVED NO GROWTH TO DATE CULTURE WILL BE HELD FOR 5 DAYS BEFORE ISSUING A FINAL NEGATIVE REPORT     Performed at [facility]   Report Status PENDING   Incomplete  CULTURE, BLOOD (ROUTINE X 2)     Status: None   Collection Time    03/29/13  7:10 PM      Result Value Range Status   Specimen Description BLOOD RIGHT ANTECUBITAL   Final   Special Requests BOTTLES DRAWN AEROBIC AND ANAEROBIC 10CC   Final   Culture  Setup Time     Final   Value: 03/30/2013 00:43     Performed at [facility]   Culture     Final   Value:        BLOOD CULTURE RECEIVED NO GROWTH TO DATE CULTURE WILL BE HELD FOR 5 DAYS BEFORE ISSUING A FINAL NEGATIVE  REPORT     Performed at [facility]   Report Status PENDING   Incomplete  URINE CULTURE     Status: None   Collection Time    03/29/13  8:20 PM      Result Value Range Status   Specimen Description URINE, CATHETERIZED   Final   Special Requests NONE   Final   Culture  Setup Time     Final   Value: 03/29/2013 22:59     Performed at [facility] Count     Final   Value: NO GROWTH     Performed at [facility]   Culture     Final   Value: NO GROWTH     Performed at [facility]   Report Status 03/31/2013 FINAL   Final  MRSA PCR SCREENING     Status: None   Collection Time    03/30/13  1:29 AM      Result Value Range Status   MRSA by PCR NEGATIVE  NEGATIVE Final   Comment:  The GeneXpert MRSA Assay (FDA     approved for NASAL specimens     only), is one component of a     comprehensive MRSA colonization     surveillance program. It is not     intended to diagnose MRSA     infection nor to guide or     monitor treatment for     MRSA infections.    Anti-infectives   Start     Dose/Rate Route Frequency Ordered Stop   04/02/13 1100  levofloxacin (LEVAQUIN) tablet 750 mg     750 mg Oral Daily 04/02/13 1009     04/02/13 0200  vancomycin (VANCOCIN) IVPB 1000 mg/200 mL premix  Status:  Discontinued     1,000 mg 200 mL/hr over 60 Minutes Intravenous Every 12 hours 04/02/13 0114 04/02/13 1009   04/02/13 0000  levofloxacin (LEVAQUIN) 750 MG tablet     750 mg Oral Daily 04/02/13 1012 04/06/13 2359   03/30/13 2012  vancomycin (VANCOCIN) IVPB 1000 mg/200 mL premix  Status:  Discontinued     1,000 mg 200 mL/hr over 60 Minutes Intravenous Every 24 hours 03/29/13 2032 03/30/13 1055   03/30/13 1200  vancomycin (VANCOCIN) 500 mg in sodium chloride 0.9 % 100 mL IVPB  Status:  Discontinued     500 mg 100 mL/hr over 60 Minutes Intravenous Every 12 hours 03/30/13 1055 04/02/13 0115   03/30/13 0800  vancomycin (VANCOCIN) IVPB 1000 mg/200 mL  premix  Status:  Discontinued     1,000 mg 200 mL/hr over 60 Minutes Intravenous Every 12 hours 03/29/13 1931 03/29/13 2032   03/30/13 0600  piperacillin-tazobactam (ZOSYN) IVPB 3.375 g  Status:  Discontinued     3.375 g 12.5 mL/hr over 240 Minutes Intravenous 3 times per day 03/29/13 1931 04/02/13 1009   03/29/13 2000  vancomycin (VANCOCIN) IVPB 1000 mg/200 mL premix  Status:  Discontinued     1,000 mg 200 mL/hr over 60 Minutes Intravenous Every 12 hours 03/29/13 1930 03/29/13 2032   03/29/13 2000  piperacillin-tazobactam (ZOSYN) IVPB 3.375 g     3.375 g 100 mL/hr over 30 Minutes Intravenous  Once 03/29/13 1930 03/29/13 2100   03/29/13 1900  piperacillin-tazobactam (ZOSYN) IVPB 3.375 g  Status:  Discontinued     3.375 g 100 mL/hr over 30 Minutes Intravenous  Once 03/29/13 1857 03/29/13 1929   03/29/13 1900  vancomycin (VANCOCIN) IVPB 1000 mg/200 mL premix  Status:  Discontinued     1,000 mg 200 mL/hr over 60 Minutes Intravenous  Once 03/29/13 1857 03/29/13 1930      Assessment: 78 YOM on D#4 Vanc/Zosyn for HCAP vs. Aspiration pneumonia. Afebrile, wbc normalized, plan to discharge to nursing home on PO levaquin, cultures are negative so far. Scr elevated on admission, but renal function has been improving. Current scr 0.57, est. crcl ~ 60 - 65 ml/min.   Vancomycin 11/2> 11/6 Zosyn 11/2> 11/6 levaquin 11/6 >>  Urine 11/2> neg Blood x 2 11/2> ngtd   Plan:  Levaquin 750 mg po daily as ordered Recommend additional 4 days after discharge to finish 8 day course for pneumonia  Bayard Hugger, PharmD, BCPS  Clinical Pharmacist  Pager: [phone]   04/02/2013,10:19 AM

## 2013-04-03 DIAGNOSIS — R7309 Other abnormal glucose: Secondary | ICD-10-CM

## 2013-04-03 LAB — GLUCOSE, CAPILLARY: Glucose-Capillary: 163 mg/dL — ABNORMAL HIGH (ref 70–99)

## 2013-04-03 NOTE — Clinical Social Work Placement (Signed)
Clinical Social Work Department CLINICAL SOCIAL WORK PLACEMENT NOTE 04/03/2013  Patient:  Dennis Meadows, Dennis Meadows  Account Number:  [id number] Admit date:  03/29/2013  Clinical Social Worker:  Cherre Blanc, [place]  Date/time:  04/03/2013 10:00 AM  Clinical Social Work is seeking post-discharge placement for this patient at the following level of care:   SKILLED NURSING   (*CSW will update this form in Epic as items are completed)     Patient/family provided with [facility] Department of Clinical Social Work's list of facilities offering this level of care within the geographic area requested by the patient (or if unable, by the patient's family).  04/03/2013  Patient/family informed of their freedom to choose among providers that offer the needed level of care, that participate in Medicare, Medicaid or managed care program needed by the patient, have an available bed and are willing to accept the patient.    Patient/family informed of [facility]' ownership interest in [facility], as well as of the fact that they are under no obligation to receive care at this facility.  PASARR submitted to EDS on 04/03/2013 PASARR number received from EDS on 04/03/2013  FL2 transmitted to all facilities in geographic area requested by pt/family on  04/03/2013 FL2 transmitted to all facilities within larger geographic area on   Patient informed that his/her managed care company has contracts with or will negotiate with  certain facilities, including the following:     Patient/family informed of bed offers received:  04/03/2013 Patient chooses bed at [facility] Physician recommends and patient chooses bed at    Patient to be transferred to [facility] on  04/03/2013 Patient to be transferred to facility by Ambulance  The following physician request were entered in Epic:   Additional Comments: Per MD patient ready to DC to [facility] 04/03/13. Legal  Guardian Tomma Rakers, RN, and facility notified of DC. RN given number for report. Ambulance requested for patient. DC Packet left with patient's chart. DC summary faxed to Tomma Rakers per his request. CSW signing.  Roddie Mc, [place], [place], [phone]

## 2013-04-05 LAB — CULTURE, BLOOD (ROUTINE X 2): Culture: NO GROWTH

## 2013-04-09 ENCOUNTER — Non-Acute Institutional Stay (SKILLED_NURSING_FACILITY): Payer: Medicare Other | Admitting: Internal Medicine

## 2013-04-09 DIAGNOSIS — F0390 Unspecified dementia without behavioral disturbance: Secondary | ICD-10-CM

## 2013-04-09 DIAGNOSIS — E119 Type 2 diabetes mellitus without complications: Secondary | ICD-10-CM

## 2013-04-09 DIAGNOSIS — I4891 Unspecified atrial fibrillation: Secondary | ICD-10-CM

## 2013-04-09 DIAGNOSIS — J189 Pneumonia, unspecified organism: Secondary | ICD-10-CM

## 2013-04-09 NOTE — ED Provider Notes (Signed)
I saw and evaluated the patient, reviewed the resident's note and I agree with the findings and plan.  EKG Interpretation     Ventricular Rate:  120 PR Interval:  135 QRS Duration: 144 QT Interval:  430 QTC Calculation: 608 R Axis:   15 Text Interpretation:  Sinus tachycardia Right bundle branch block 1st degree av block STE v1/v2, noted on previous but more pronounced             CRITICAL CARE Performed by: Raeford Razor  Total critical care time: 35 minutes, re: severe sepsis/metabolic derangement/encephalopathy. Multiple diagnostic studes to interpret, interventions and reassessments with complex medical decision making.   Critical care time was exclusive of separately billable procedures and treating other patients. Critical care was necessary to treat or prevent imminent or life-threatening deterioration. Critical care was time spent personally by me on the following activities: development of treatment plan with patient and/or surrogate as well as nursing, discussions with consultants, evaluation of patient's response to treatment, examination of patient, obtaining history from patient or surrogate, ordering and performing treatments and interventions, ordering and review of laboratory studies, ordering and review of radiographic studies, pulse oximetry and re-evaluation of patient's condition.  78yM with severe sepsis and marked hyperglycemia likely precipitated by this. Pneumonia. Abx for HCAP ordered. Needs admit.   Raeford Razor, MD 04/09/13 1018

## 2013-05-04 ENCOUNTER — Non-Acute Institutional Stay (SKILLED_NURSING_FACILITY): Payer: Medicare Other | Admitting: Family

## 2013-05-04 ENCOUNTER — Encounter: Payer: Self-pay | Admitting: Family

## 2013-05-04 DIAGNOSIS — E1110 Type 2 diabetes mellitus with ketoacidosis without coma: Secondary | ICD-10-CM

## 2013-05-04 DIAGNOSIS — F03918 Unspecified dementia, unspecified severity, with other behavioral disturbance: Secondary | ICD-10-CM

## 2013-05-04 DIAGNOSIS — I10 Essential (primary) hypertension: Secondary | ICD-10-CM

## 2013-05-04 DIAGNOSIS — F0391 Unspecified dementia with behavioral disturbance: Secondary | ICD-10-CM

## 2013-05-04 NOTE — Progress Notes (Signed)
Patient ID: Dennis Meadows, male   DOB: 07-Nov-1934, 78 y.o.   MRN: [id number]  Date: 05/04/13 Facility: [facility]  Code Status:  Full Code  Chief Complaint  Patient presents with  . Medical Managment of Chronic Issues    Routine Visit    HPI: Pt. Is followed for the medical management of chronic illnesses. Health care team denies concerns/issues at present.      No Known Allergies   Current Outpatient Prescriptions on File Prior to Visit  Medication Sig Dispense Refill  . acetaminophen (TYLENOL) 500 MG tablet Take 500 mg by mouth every 4 (four) hours as needed. For pain      . antiseptic oral rinse (BIOTENE) LIQD 15 mLs by Mouth Rinse route 2 times daily at 12 noon and 4 pm.      . chlorhexidine (PERIDEX) 0.12 % solution 15 mLs by Mouth Rinse route 2 (two) times daily.  120 mL  0  . cloNIDine (CATAPRES - DOSED IN MG/24 HR) 0.3 mg/24hr patch Place 1 patch (0.3 mg total) onto the skin once a week. Unsure day of week, SNF did not have on MAR  4 patch  12  . doxazosin (CARDURA) 2 MG tablet Take 1 tablet (2 mg total) by mouth daily.      . feeding supplement, GLUCERNA SHAKE, (GLUCERNA SHAKE) LIQD Take 237 mLs by mouth 3 (three) times daily between meals.    0  . insulin glargine (LANTUS) 100 UNIT/ML injection Inject 0.1 mLs (10 Units total) into the skin 2 (two) times daily.  10 mL  12  . loperamide (IMODIUM A-D) 2 MG tablet Take 2 mg by mouth as needed. For diarrhea      . LORazepam (ATIVAN) 0.5 MG tablet Take 0.5 mg by mouth every 6 (six) hours as needed for anxiety.      . magnesium hydroxide (MILK OF MAGNESIA) 400 MG/5ML suspension Take 30 mLs by mouth at bedtime as needed. For constipation      . metoprolol tartrate (LOPRESSOR) 25 MG tablet Take 1 tablet (25 mg total) by mouth 2 (two) times daily.      . mirtazapine (REMERON) 15 MG tablet Take 15 mg by mouth at bedtime.      . rivastigmine (EXELON) 9.5 mg/24hr Place 1 patch (9.5 mg total) onto the skin daily.  30 patch  12  .  insulin aspart (NOVOLOG) 100 UNIT/ML injection Inject 0-9 Units into the skin 3 (three) times daily with meals.  1 vial  12   No current facility-administered medications on file prior to visit.     DATA REVIEWED  Radiologic Exams:   Cardiovascular Exams:   Laboratory Studies: 04/23/13 BNP 134.5 04/13/13-WBC 8.0, Hemoglobin 14.2, Hematocrit 40.5, Platelets 310, BUN 14, Creatinine 0.75, Na 140, K 4.5, Cl 101, Calcium 8.9     Past Medical History  Diagnosis Date  . Dementia   . DNR (do not resuscitate)   . Cerebral hemorrhage   . Hypertension   . Depression   . Diabetes mellitus   . BPH (benign prostatic hypertrophy)   . Coronary artery disease   . Atrial fibrillation   . Vitamin B 12 deficiency   . C2 cervical fracture   . Former smoker     Past Surgical History  Procedure Laterality Date  . Coronary artery bypass graft    '   History   Social History  . Marital Status: Divorced    Spouse Name: N/A    Number  of Children: N/A  . Years of Education: N/A   Occupational History  . Not on file.   Social History Main Topics  . Smoking status: Former Smoker    Types: Cigarettes  . Smokeless tobacco: Not on file  . Alcohol Use: No  . Drug Use: No  . Sexual Activity: Not on file   Other Topics Concern  . Not on file   Social History Narrative  . No narrative on file     Review of Systems  Unable to perform ROS: dementia     Physical Exam Filed Vitals:   05/04/13 1713  BP: 136/77  Pulse: 58  Temp: 97.5 F (36.4 C)  Resp: 16   Physical Exam  Constitutional: He appears listless.  Cardiovascular: Normal rate and regular rhythm.   Pulmonary/Chest: Effort normal and breath sounds normal.  Neurological: He appears listless.  Psychiatric: His affect is angry. He is aggressive. Thought content is paranoid. Cognition and memory are impaired. He is noncommunicative.    ASSESSMENT/PLAN DM-will continue current treatment plan/ obtain hemoglobin AIC and  lipid panel HTN-will continue current treatment Clonidine 0.3 mg and Lopressor Dementia-will continue Exelon patch, Depakote and collaborate with psych services Obtain TSH and Vitamin D  Follow up:prn

## 2013-05-14 ENCOUNTER — Non-Acute Institutional Stay (SKILLED_NURSING_FACILITY): Payer: Medicare Other | Admitting: Internal Medicine

## 2013-05-14 DIAGNOSIS — E559 Vitamin D deficiency, unspecified: Secondary | ICD-10-CM

## 2013-05-14 DIAGNOSIS — E1159 Type 2 diabetes mellitus with other circulatory complications: Secondary | ICD-10-CM

## 2013-05-14 DIAGNOSIS — E7800 Pure hypercholesterolemia, unspecified: Secondary | ICD-10-CM

## 2013-05-15 NOTE — Progress Notes (Signed)
Patient ID: Dennis Meadows, male   DOB: [birth year], 78 y.o.   MRN: [id number]        HISTORY & PHYSICAL  DATE: 04/09/2013     FACILITY: [facility]  LEVEL OF CARE: SNF (31)  ALLERGIES:  No Known Allergies  CHIEF COMPLAINT:  Manage atrial fibrillation, dementia, and diabetes mellitus.    HISTORY OF PRESENT ILLNESS:  The patient is a 78 year-old, Caucasian male who was hospitalized for fever and confusion.  After hospitalization, he is admitted to this facility for short-term rehabilitation.   He has the following problems:    DEMENTIA:  Advanced.  The dementia remains stable and continues to function adequately in the current living environment with supervision.  The patient has had little changes in behavior. No complications noted from the medications presently being used.  Patient is a poor historian.    ATRIAL FIBRILLATION: the patients atrial fibrillation remains stable.  The staff denies DOE, tachycardia, orthopnea, transient neurological sx, pedal edema, palpitations, & PNDs.  No complications noted from the medications currently being used.    DM:pt's DM remains stable.  Staff denies polyuria, polydipsia, polyphagia, changes in vision or hypoglycemic episodes.  No complications noted from the medication presently being used.  Last hemoglobin A1c is:   Not available.    PAST MEDICAL HISTORY :  Past Medical History  Diagnosis Date  . Dementia   . DNR (do not resuscitate)   . Cerebral hemorrhage   . Hypertension   . Depression   . Diabetes mellitus   . BPH (benign prostatic hypertrophy)   . Coronary artery disease   . Atrial fibrillation   . Vitamin B 12 deficiency   . C2 cervical fracture   . Former smoker     PAST SURGICAL HISTORY: Past Surgical History  Procedure Laterality Date  . Coronary artery bypass graft      SOCIAL HISTORY: per record  reports that he has quit smoking. His smoking use included Cigarettes. He smoked 0.00 packs per day. He does  not have any smokeless tobacco history on file. He reports that he does not drink alcohol or use illicit drugs.  FAMILY HISTORY: None per record  CURRENT MEDICATIONS: Reviewed per [facility]  REVIEW OF SYSTEMS:  Unobtainable due to dementia.    PHYSICAL EXAMINATION  VS:  T 97.2       P 68      RR 18      BP 134/82      POX 97%        WT (Lb) 151    GENERAL: no acute distress, normal body habitus EYES: conjunctivae normal, sclerae normal, normal eye lids MOUTH/THROAT: lips without lesions,no lesions in the mouth,tongue is without lesions,uvula elevates in midline NECK: supple, trachea midline, no neck masses, no thyroid tenderness, no thyromegaly LYMPHATICS: no LAN in the neck, no supraclavicular LAN RESPIRATORY: breathing is even & unlabored, BS CTAB CARDIAC: heart rate is irregular irregular, no murmur,no extra heart sounds, no edema GI:  ABDOMEN: abdomen soft, normal BS, no masses, no tenderness  LIVER/SPLEEN: no hepatomegaly, no splenomegaly MUSCULOSKELETAL: HEAD: normal to inspection & palpation BACK: no kyphosis, scoliosis or spinal processes tenderness EXTREMITIES: LEFT UPPER EXTREMITY: grossly moves extremity   RIGHT UPPER EXTREMITY: grossly moves extremity    LEFT LOWER EXTREMITY: grossly moves extremity    RIGHT LOWER EXTREMITY: grossly moves extremity    PSYCHIATRIC: the patient is alert, unable to assess orientation, decreased affect and mood  LABS/RADIOLOGY: Blood cultures x2 showed no growth.    CT of the abdomen:  No acute findings.    CT of the head:  No acute findings.    Ultrasound of the abdomen:  Did not show cholecystitis.    CT of the cervical spine:  No acute findings.    Urine culture showed no growth.    MRSA by PCR negative.     Labs reviewed: Basic Metabolic Panel:  Recent Labs  19/14/78 0430 04/01/13 0600 04/02/13 0455  NA 145 141 137  K 3.6 2.9* 3.8  CL 113* 110 104  CO2 24 23 21   GLUCOSE 230* 100* 151*  BUN 25* 17 7  CREATININE 0.78  0.64 0.57  CALCIUM 8.4 8.4 8.6   Liver Function Tests:  Recent Labs  03/29/13 1910 03/30/13 0415  AST 11 19  ALT 18 18  ALKPHOS 83 71  BILITOT 0.6 0.6  PROT 7.1 6.6  ALBUMIN 3.3* 3.1*   CBC:  Recent Labs  05/28/12 1447 03/29/13 1910 03/30/13 0415 03/31/13 0430 04/01/13 0600 04/02/13 0455  WBC 12.7* 21.4* 22.5* 16.9* 11.6* 10.5  NEUTROABS 10.5* 17.5* 17.7*  --   --   --   HGB 19.0* 17.3* 15.8 13.7 14.0 14.8  HCT 55.1* 49.5 45.2 40.1 39.8 41.9  MCV 92.4 95.4 94.4 95.5 94.5 93.5  PLT 183 252 232 167 157 192    CBG:  Recent Labs  04/02/13 2126 04/03/13 0651 04/03/13 1058  GLUCAP 164* 163* 275*    ASSESSMENT/PLAN:  Atrial fibrillation.  Rate controlled.    Dementia.  Advanced.    Diabetes mellitus.  Continue Lantus.    Left lower lobe pneumonia.  Continue Levaquin as prescribed.    Hypertension.  Well controlled.    Check CBC and BMP.    I have reviewed patient's medical records received at admission/from hospitalization.  CPT CODE: 29562

## 2013-05-16 ENCOUNTER — Encounter: Payer: Self-pay | Admitting: Internal Medicine

## 2013-05-16 DIAGNOSIS — E559 Vitamin D deficiency, unspecified: Secondary | ICD-10-CM | POA: Insufficient documentation

## 2013-05-16 DIAGNOSIS — E1159 Type 2 diabetes mellitus with other circulatory complications: Secondary | ICD-10-CM | POA: Insufficient documentation

## 2013-05-16 DIAGNOSIS — E7800 Pure hypercholesterolemia, unspecified: Secondary | ICD-10-CM | POA: Insufficient documentation

## 2013-05-16 NOTE — Progress Notes (Signed)
PROGRESS NOTE  DATE: 05/14/2013  FACILITY:  [facility]  LEVEL OF CARE: SNF (31)  Acute Visit  CHIEF COMPLAINT:  Manage vitamin D deficiency, diabetes mellitus and hyperlipidemia  HISTORY OF PRESENT ILLNESS: I was requested by the staff to assess the patient regarding above problem(s):  VITAMIN D DEFICIENCY: New problem. On 05-07-13 vitamin D level is 11.8. Patient is currently not on vitamin D supplementation. he is a poor historian.  DM:pt's DM is unstable.  Staff deny polyuria, polydipsia, polyphagia, changes in vision or hypoglycemic episodes.  No complications noted from the medication presently being used.  Last hemoglobin A1c is: 10.3 on 05-07-13. Per staff patient is refuses Lantus at times.  HYPERLIPIDEMIA: New problem. On 05-07-13 total cholesterol 214, triglycerides 242, HDL 27, and 139. Patient is currently not on any lipid lowering agents.  PAST MEDICAL HISTORY : Reviewed.  No changes.  CURRENT MEDICATIONS: Reviewed per [facility]  REVIEW OF SYSTEMS: Unobtainable due to patient being a poor historian  PHYSICAL EXAMINATION  GENERAL: no acute distress, normal body habitus EYES: conjunctivae normal, sclerae normal, normal eye lids NECK: supple, trachea midline, no neck masses, no thyroid tenderness, no thyromegaly LYMPHATICS: no LAN in the neck, no supraclavicular LAN RESPIRATORY: breathing is even & unlabored, BS CTAB CARDIAC: RRR, no murmur,no extra heart sounds, no edema GI: abdomen soft, normal BS, no masses, no tenderness, no hepatomegaly, no splenomegaly PSYCHIATRIC: the patient is alert & disoriented, affect & behavior appropriate  LABS/RADIOLOGY: See history of present illness  ASSESSMENT/PLAN:  vitamin D deficiency-new problem. Start vitamin D 50,000 units q. Monthly. Diabetes mellitus-uncontrolled due to noncompliance. Therefore will not increase Lantus at this time. Hyperlipidemia-uncontrolled new problem. Check liver profile. If liver  functions normal consider starting a statin.  CPT CODE: 16109

## 2013-06-04 ENCOUNTER — Non-Acute Institutional Stay (SKILLED_NURSING_FACILITY): Payer: Medicare Other | Admitting: Internal Medicine

## 2013-06-04 DIAGNOSIS — E559 Vitamin D deficiency, unspecified: Secondary | ICD-10-CM

## 2013-06-04 DIAGNOSIS — I4891 Unspecified atrial fibrillation: Secondary | ICD-10-CM

## 2013-06-04 DIAGNOSIS — F0390 Unspecified dementia without behavioral disturbance: Secondary | ICD-10-CM

## 2013-06-04 DIAGNOSIS — E1159 Type 2 diabetes mellitus with other circulatory complications: Secondary | ICD-10-CM

## 2013-06-05 NOTE — Progress Notes (Signed)
         PROGRESS NOTE  DATE: 06/04/2013  FACILITY: Nursing Home [facility]  LEVEL OF CARE: SNF (31)  Routine Visit  CHIEF COMPLAINT:  Manage vitamin D deficiency, dementia and atrial fibrillation  HISTORY OF PRESENT ILLNESS:  REASSESSMENT OF ONGOING PROBLEM(S):  DEMENTIA: The dementia remaines stable and continues to function adequately in the current living environment with supervision.  The patient has had little changes in behavior. No complications noted from the medications presently being used. Advanced. Patient is a poor historian. I was requested by the facility to assess the patient  for his ability to make financial decisions or direct others to do so.  VITAMIN D DEFICIENCY: New problem. HEENT-14 vitamin D level 11.8. Patient is not on vitamin D supplementation. Staff do not report any neuromuscular symptoms.  ATRIAL FIBRILLATION: the patients atrial fibrillation remains stable.  The staff deny DOE, tachycardia, orthopnea, transient neurological sx, pedal edema, palpitations, & PNDs.  No complications noted from the medications currently being used.  PAST MEDICAL HISTORY : Reviewed.  No changes.  CURRENT MEDICATIONS: Reviewed per [facility]  REVIEW OF SYSTEMS: Unobtainable due to dementia  PHYSICAL EXAMINATION  VS:  T 96.9       P 64      RR 18     BP 120/72     WT (Lb) 154  GENERAL: no acute distress, normal body habitus EYES: conjunctivae normal, sclerae normal, normal eye lids NECK: supple, trachea midline, no neck masses, no thyroid tenderness, no thyromegaly LYMPHATICS: no LAN in the neck, no supraclavicular LAN RESPIRATORY: breathing is even & unlabored, BS CTAB CARDIAC: Heart rate is irregularly irregular, no murmur,no extra heart sounds, no edema GI: abdomen soft, normal BS, no masses, no tenderness, no hepatomegaly, no splenomegaly PSYCHIATRIC: the patient is alert & disoriented, affect & behavior  appropriate  LABS/RADIOLOGY:  12-14 liver profile normal, hemoglobin 16.2 otherwise CBC normal, hemoglobin A1c 10.6, glucose 170 otherwise BMP normal  ASSESSMENT/PLAN:  Vitamin D deficiency-new problem. Start vitamin D2  50,000 units q. Monthly Dementia-advanced. Check TSH and vitamin B12 level. Due to his cognitive impairment he is not able to make financial decisions on direct others to do so. Atrial fibrillation-rate controlled Diabetes mellitus-uncontrolled. Medications adjusted. Hypertension-well controlled Depression-continue Remeron Anxiety-Ativan was started  CPT CODE: 25366

## 2013-07-02 ENCOUNTER — Non-Acute Institutional Stay (SKILLED_NURSING_FACILITY): Payer: Medicare Other | Admitting: Internal Medicine

## 2013-07-02 DIAGNOSIS — E559 Vitamin D deficiency, unspecified: Secondary | ICD-10-CM

## 2013-07-02 DIAGNOSIS — F0391 Unspecified dementia with behavioral disturbance: Secondary | ICD-10-CM

## 2013-07-02 DIAGNOSIS — E1159 Type 2 diabetes mellitus with other circulatory complications: Secondary | ICD-10-CM

## 2013-07-02 DIAGNOSIS — I4891 Unspecified atrial fibrillation: Secondary | ICD-10-CM

## 2013-07-02 DIAGNOSIS — F03918 Unspecified dementia, unspecified severity, with other behavioral disturbance: Secondary | ICD-10-CM

## 2013-07-03 DIAGNOSIS — F03918 Unspecified dementia, unspecified severity, with other behavioral disturbance: Secondary | ICD-10-CM | POA: Insufficient documentation

## 2013-07-03 DIAGNOSIS — F0391 Unspecified dementia with behavioral disturbance: Secondary | ICD-10-CM | POA: Insufficient documentation

## 2013-07-03 NOTE — Progress Notes (Signed)
                PROGRESS NOTE  DATE: 07-02-13  FACILITY: Nursing Home Location: [facility]  LEVEL OF CARE: SNF (31)  Routine Visit  CHIEF COMPLAINT:  Manage vitamin D deficiency, dementia and atrial fibrillation  HISTORY OF PRESENT ILLNESS:  REASSESSMENT OF ONGOING PROBLEM(S):  DEMENTIA: The dementia is unstable. No complications noted from the medications presently being used. Advanced. Staff reports that patient is combative and refuses CBG checks and insulin. Patient is a poor historian.   VITAMIN D DEFICIENCY: New problem. In 12-14 vitamin D level 11.8. Patient is not on vitamin D supplementation. Staff do not report any neuromuscular symptoms.  ATRIAL FIBRILLATION: the patients atrial fibrillation remains stable.  The staff deny DOE, tachycardia, orthopnea, transient neurological sx, pedal edema, palpitations, & PNDs.  No complications noted from the medications currently being used.  PAST MEDICAL HISTORY : Reviewed.  No changes.  CURRENT MEDICATIONS: Reviewed per [facility]  REVIEW OF SYSTEMS: Unobtainable due to dementia  PHYSICAL EXAMINATION  VS:  T 97.6       P 68     RR 18     BP 122/74    WT (Lb) 155  GENERAL: no acute distress, normal body habitus EYES: conjunctivae normal, sclerae normal, normal eye lids NECK: supple, trachea midline, no neck masses, no thyroid tenderness, no thyromegaly LYMPHATICS: no LAN in the neck, no supraclavicular LAN RESPIRATORY: breathing is even & unlabored, BS CTAB CARDIAC: Heart rate is irregularly irregular, no murmur,no extra heart sounds, no edema GI: abdomen soft, normal BS, no masses, no tenderness, no hepatomegaly, no splenomegaly PSYCHIATRIC: the patient is alert & disoriented, affect & behavior appropriate  LABS/RADIOLOGY:  12-14 liver profile normal, hemoglobin 16.2 otherwise CBC normal, hemoglobin A1c 10.6, glucose 170 otherwise BMP normal  ASSESSMENT/PLAN:  Vitamin D deficiency-vitamin D was  started. Dementia-advanced. Check TSH and vitamin B12 level. Increase Depakote to 500 mg twice a day. Atrial fibrillation-rate controlled Diabetes mellitus-uncontrolled. DC Lantus, sliding scale. Start metformin 500 mg twice a day. Hypertension-well controlled Depression-continue Remeron Anxiety-on Ativan. Check Depakote level in one week  CPT CODE: 05110

## 2013-07-08 ENCOUNTER — Encounter: Payer: Self-pay | Admitting: *Deleted

## 2013-07-14 ENCOUNTER — Non-Acute Institutional Stay (SKILLED_NURSING_FACILITY): Payer: Medicare Other | Admitting: Internal Medicine

## 2013-07-14 DIAGNOSIS — E1159 Type 2 diabetes mellitus with other circulatory complications: Secondary | ICD-10-CM

## 2013-07-14 DIAGNOSIS — E559 Vitamin D deficiency, unspecified: Secondary | ICD-10-CM

## 2013-07-21 NOTE — Progress Notes (Signed)
Patient ID: Dennis Meadows, male   DOB: February 26, 1935, 79 y.o.   MRN: [id number]          PROGRESS NOTE  DATE: 07/14/2013    FACILITY:  [facility]  LEVEL OF CARE: SNF (31)  Acute Visit  CHIEF COMPLAINT:  Manage diabetes mellitus and vitamin D deficiency.    HISTORY OF PRESENT ILLNESS: I was requested by the staff to assess the patient regarding above problem(s):  DM:pt's DM is unstable.  Staff denies polyuria, polydipsia, polyphagia, changes in vision or hypoglycemic episodes.  No complications noted from the medication presently being used.  Last hemoglobin A1c is:  10.2 on 07/08/2013.  Patient is a poor historian due to dementia.    VITAMIN D DEFICIENCY:  Unstable problem.  On 07/08/2013:  Vitamin D level was 11.6.  He is on vitamin D 50,000 U q.monthly.    PAST MEDICAL HISTORY : Reviewed.  No changes.  CURRENT MEDICATIONS: Reviewed per [facility]  REVIEW OF SYSTEMS:  Unobtainable due to dementia.    PHYSICAL EXAMINATION  VS:  T 97.6      P 68      RR 18     BP 122/74     WT (Lb) 155     GENERAL: no acute distress, normal body habitus NECK: supple, trachea midline, no neck masses, no thyroid tenderness, no thyromegaly RESPIRATORY: breathing is even & unlabored, BS CTAB CARDIAC: RRR, no murmur,no extra heart sounds, no edema GI: abdomen soft, normal BS, no masses, no tenderness, no hepatomegaly, no splenomegaly PSYCHIATRIC: the patient is alert, disoriented, decreased affect and mood    ASSESSMENT/PLAN:  Diabetes mellitus.  Uncontrolled problem.  Likely due to noncompliance with his insulin.  He was changed to metformin 500 mg b.i.d. last week.  We will increase the metformin to 1000 mg b.i.d.    Vitamin D deficiency.  Unstable problem.  Likely due to noncompliance.  Continue vitamin D as prescribed.    CPT CODE: 33295      Perian Tedder Y Tarus [place], [place] [phone]

## 2013-07-26 ENCOUNTER — Emergency Department (HOSPITAL_COMMUNITY): Payer: Medicare Other

## 2013-07-26 ENCOUNTER — Inpatient Hospital Stay (HOSPITAL_COMMUNITY): Payer: Medicare Other

## 2013-07-26 ENCOUNTER — Encounter (HOSPITAL_COMMUNITY): Payer: Self-pay | Admitting: Emergency Medicine

## 2013-07-26 ENCOUNTER — Inpatient Hospital Stay (HOSPITAL_COMMUNITY)
Admission: EM | Admit: 2013-07-26 | Discharge: 2013-07-31 | DRG: 480 | Disposition: A | Discharge status: Skilled Nursing Facility | Payer: Medicare Other | Attending: Internal Medicine | Admitting: Internal Medicine

## 2013-07-26 DIAGNOSIS — I2589 Other forms of chronic ischemic heart disease: Secondary | ICD-10-CM | POA: Diagnosis present

## 2013-07-26 DIAGNOSIS — I10 Essential (primary) hypertension: Secondary | ICD-10-CM

## 2013-07-26 DIAGNOSIS — Z951 Presence of aortocoronary bypass graft: Secondary | ICD-10-CM

## 2013-07-26 DIAGNOSIS — S7291XA Unspecified fracture of right femur, initial encounter for closed fracture: Secondary | ICD-10-CM

## 2013-07-26 DIAGNOSIS — E440 Moderate protein-calorie malnutrition: Secondary | ICD-10-CM

## 2013-07-26 DIAGNOSIS — Z87891 Personal history of nicotine dependence: Secondary | ICD-10-CM

## 2013-07-26 DIAGNOSIS — M62838 Other muscle spasm: Secondary | ICD-10-CM | POA: Diagnosis present

## 2013-07-26 DIAGNOSIS — R0682 Tachypnea, not elsewhere classified: Secondary | ICD-10-CM | POA: Diagnosis present

## 2013-07-26 DIAGNOSIS — S72143A Displaced intertrochanteric fracture of unspecified femur, initial encounter for closed fracture: Principal | ICD-10-CM | POA: Diagnosis present

## 2013-07-26 DIAGNOSIS — N400 Enlarged prostate without lower urinary tract symptoms: Secondary | ICD-10-CM

## 2013-07-26 DIAGNOSIS — Z66 Do not resuscitate: Secondary | ICD-10-CM | POA: Diagnosis present

## 2013-07-26 DIAGNOSIS — F0391 Unspecified dementia with behavioral disturbance: Secondary | ICD-10-CM

## 2013-07-26 DIAGNOSIS — D72829 Elevated white blood cell count, unspecified: Secondary | ICD-10-CM

## 2013-07-26 DIAGNOSIS — F3289 Other specified depressive episodes: Secondary | ICD-10-CM | POA: Diagnosis present

## 2013-07-26 DIAGNOSIS — E1165 Type 2 diabetes mellitus with hyperglycemia: Secondary | ICD-10-CM

## 2013-07-26 DIAGNOSIS — S7290XA Unspecified fracture of unspecified femur, initial encounter for closed fracture: Secondary | ICD-10-CM

## 2013-07-26 DIAGNOSIS — W19XXXA Unspecified fall, initial encounter: Secondary | ICD-10-CM

## 2013-07-26 DIAGNOSIS — R532 Functional quadriplegia: Secondary | ICD-10-CM | POA: Diagnosis present

## 2013-07-26 DIAGNOSIS — F03918 Unspecified dementia, unspecified severity, with other behavioral disturbance: Secondary | ICD-10-CM

## 2013-07-26 DIAGNOSIS — J189 Pneumonia, unspecified organism: Secondary | ICD-10-CM | POA: Diagnosis present

## 2013-07-26 DIAGNOSIS — M4000 Postural kyphosis, site unspecified: Secondary | ICD-10-CM | POA: Diagnosis present

## 2013-07-26 DIAGNOSIS — E876 Hypokalemia: Secondary | ICD-10-CM | POA: Diagnosis not present

## 2013-07-26 DIAGNOSIS — R55 Syncope and collapse: Secondary | ICD-10-CM

## 2013-07-26 DIAGNOSIS — I4892 Unspecified atrial flutter: Secondary | ICD-10-CM | POA: Diagnosis present

## 2013-07-26 DIAGNOSIS — I4891 Unspecified atrial fibrillation: Secondary | ICD-10-CM

## 2013-07-26 DIAGNOSIS — Z8673 Personal history of transient ischemic attack (TIA), and cerebral infarction without residual deficits: Secondary | ICD-10-CM

## 2013-07-26 DIAGNOSIS — W010XXA Fall on same level from slipping, tripping and stumbling without subsequent striking against object, initial encounter: Secondary | ICD-10-CM | POA: Diagnosis present

## 2013-07-26 DIAGNOSIS — Y9210 Unspecified residential institution as the place of occurrence of the external cause: Secondary | ICD-10-CM | POA: Diagnosis present

## 2013-07-26 DIAGNOSIS — E119 Type 2 diabetes mellitus without complications: Secondary | ICD-10-CM

## 2013-07-26 DIAGNOSIS — R1310 Dysphagia, unspecified: Secondary | ICD-10-CM

## 2013-07-26 DIAGNOSIS — E7800 Pure hypercholesterolemia, unspecified: Secondary | ICD-10-CM

## 2013-07-26 DIAGNOSIS — N390 Urinary tract infection, site not specified: Secondary | ICD-10-CM | POA: Diagnosis present

## 2013-07-26 DIAGNOSIS — E559 Vitamin D deficiency, unspecified: Secondary | ICD-10-CM

## 2013-07-26 DIAGNOSIS — E1159 Type 2 diabetes mellitus with other circulatory complications: Secondary | ICD-10-CM

## 2013-07-26 DIAGNOSIS — F329 Major depressive disorder, single episode, unspecified: Secondary | ICD-10-CM | POA: Diagnosis present

## 2013-07-26 DIAGNOSIS — R51 Headache: Secondary | ICD-10-CM | POA: Diagnosis present

## 2013-07-26 DIAGNOSIS — A419 Sepsis, unspecified organism: Secondary | ICD-10-CM

## 2013-07-26 DIAGNOSIS — F0390 Unspecified dementia without behavioral disturbance: Secondary | ICD-10-CM

## 2013-07-26 DIAGNOSIS — I798 Other disorders of arteries, arterioles and capillaries in diseases classified elsewhere: Secondary | ICD-10-CM | POA: Diagnosis present

## 2013-07-26 DIAGNOSIS — I2510 Atherosclerotic heart disease of native coronary artery without angina pectoris: Secondary | ICD-10-CM

## 2013-07-26 DIAGNOSIS — E538 Deficiency of other specified B group vitamins: Secondary | ICD-10-CM | POA: Diagnosis present

## 2013-07-26 DIAGNOSIS — G459 Transient cerebral ischemic attack, unspecified: Secondary | ICD-10-CM

## 2013-07-26 DIAGNOSIS — IMO0002 Reserved for concepts with insufficient information to code with codable children: Secondary | ICD-10-CM

## 2013-07-26 DIAGNOSIS — R627 Adult failure to thrive: Secondary | ICD-10-CM | POA: Diagnosis present

## 2013-07-26 DIAGNOSIS — I5022 Chronic systolic (congestive) heart failure: Secondary | ICD-10-CM

## 2013-07-26 LAB — COMPREHENSIVE METABOLIC PANEL
ALBUMIN: 3.4 g/dL — AB (ref 3.5–5.2)
ALK PHOS: 64 U/L (ref 39–117)
ALT: 25 U/L (ref 0–53)
AST: 17 U/L (ref 0–37)
BUN: 21 mg/dL (ref 6–23)
CO2: 19 mEq/L (ref 19–32)
Calcium: 9.2 mg/dL (ref 8.4–10.5)
Chloride: 94 mEq/L — ABNORMAL LOW (ref 96–112)
Creatinine, Ser: 0.73 mg/dL (ref 0.50–1.35)
GFR calc non Af Amer: 88 mL/min — ABNORMAL LOW (ref >90)
GLUCOSE: 268 mg/dL — AB (ref 70–99)
POTASSIUM: 4.6 meq/L (ref 3.7–5.3)
Sodium: 135 mEq/L — ABNORMAL LOW (ref 137–147)
Total Bilirubin: 0.8 mg/dL (ref 0.3–1.2)
Total Protein: 7.2 g/dL (ref 6.0–8.3)

## 2013-07-26 LAB — CBC
HEMATOCRIT: 46.5 % (ref 39.0–52.0)
HEMOGLOBIN: 15.8 g/dL (ref 13.0–17.0)
MCH: 31 pg (ref 26.0–34.0)
MCHC: 34 g/dL (ref 30.0–36.0)
MCV: 91.4 fL (ref 78.0–100.0)
Platelets: 209 10*3/uL (ref 150–400)
RBC: 5.09 MIL/uL (ref 4.22–5.81)
RDW: 13.9 % (ref 11.5–15.5)
WBC: 16.1 10*3/uL — ABNORMAL HIGH (ref 4.0–10.5)

## 2013-07-26 LAB — GLUCOSE, CAPILLARY: Glucose-Capillary: 260 mg/dL — ABNORMAL HIGH (ref 70–99)

## 2013-07-26 LAB — MRSA PCR SCREENING: MRSA by PCR: NEGATIVE

## 2013-07-26 LAB — CBG MONITORING, ED: GLUCOSE-CAPILLARY: 254 mg/dL — AB (ref 70–99)

## 2013-07-26 LAB — VALPROIC ACID LEVEL: Valproic Acid Lvl: 19.1 ug/mL — ABNORMAL LOW (ref 50.0–100.0)

## 2013-07-26 LAB — PRO B NATRIURETIC PEPTIDE: Pro B Natriuretic peptide (BNP): 1689 pg/mL — ABNORMAL HIGH (ref 0–450)

## 2013-07-26 MED ORDER — METOPROLOL TARTRATE 25 MG PO TABS
25.0000 mg | ORAL_TABLET | Freq: Two times a day (BID) | ORAL | Status: DC
  Administered 2013-07-26 – 2013-07-28 (×4): 25 mg via ORAL
  Filled 2013-07-26 (×5): qty 1

## 2013-07-26 MED ORDER — ACETAMINOPHEN 325 MG PO TABS
650.0000 mg | ORAL_TABLET | Freq: Four times a day (QID) | ORAL | Status: DC | PRN
  Administered 2013-07-27 (×2): 650 mg via ORAL
  Filled 2013-07-26 (×2): qty 2

## 2013-07-26 MED ORDER — DEXTROSE 5 % IV SOLN
1.0000 g | Freq: Three times a day (TID) | INTRAVENOUS | Status: DC
  Administered 2013-07-26 – 2013-07-30 (×12): 1 g via INTRAVENOUS
  Filled 2013-07-26 (×13): qty 1

## 2013-07-26 MED ORDER — SODIUM CHLORIDE 0.9 % IV BOLUS (SEPSIS)
500.0000 mL | Freq: Once | INTRAVENOUS | Status: AC
  Administered 2013-07-26: 500 mL via INTRAVENOUS

## 2013-07-26 MED ORDER — SODIUM CHLORIDE 0.9 % IJ SOLN
3.0000 mL | Freq: Two times a day (BID) | INTRAMUSCULAR | Status: DC
  Administered 2013-07-27 – 2013-07-30 (×2): 3 mL via INTRAVENOUS

## 2013-07-26 MED ORDER — DOXAZOSIN MESYLATE 2 MG PO TABS
2.0000 mg | ORAL_TABLET | Freq: Every day | ORAL | Status: DC
  Administered 2013-07-27 – 2013-07-31 (×5): 2 mg via ORAL
  Filled 2013-07-26 (×5): qty 1

## 2013-07-26 MED ORDER — INSULIN GLARGINE 100 UNIT/ML ~~LOC~~ SOLN
20.0000 [IU] | Freq: Every day | SUBCUTANEOUS | Status: DC
  Administered 2013-07-26 – 2013-07-30 (×5): 20 [IU] via SUBCUTANEOUS
  Filled 2013-07-26 (×6): qty 0.2

## 2013-07-26 MED ORDER — ACETAMINOPHEN 650 MG RE SUPP: 650.0000 mg | Freq: Four times a day (QID) | RECTAL | Status: DC | PRN

## 2013-07-26 MED ORDER — DOCUSATE SODIUM 100 MG PO CAPS
100.0000 mg | ORAL_CAPSULE | Freq: Every day | ORAL | Status: DC | PRN
  Filled 2013-07-26: qty 1

## 2013-07-26 MED ORDER — MIRTAZAPINE 15 MG PO TABS
15.0000 mg | ORAL_TABLET | Freq: Every day | ORAL | Status: DC
  Administered 2013-07-26 – 2013-07-30 (×5): 15 mg via ORAL
  Filled 2013-07-26 (×6): qty 1

## 2013-07-26 MED ORDER — METOPROLOL TARTRATE 1 MG/ML IV SOLN
5.0000 mg | Freq: Once | INTRAVENOUS | Status: AC
  Administered 2013-07-26: 5 mg via INTRAVENOUS
  Filled 2013-07-26: qty 5

## 2013-07-26 MED ORDER — MORPHINE SULFATE 4 MG/ML IJ SOLN
4.0000 mg | INTRAMUSCULAR | Status: DC | PRN
  Administered 2013-07-26: 4 mg via INTRAVENOUS
  Filled 2013-07-26: qty 1

## 2013-07-26 MED ORDER — ONDANSETRON HCL 4 MG/2ML IJ SOLN
4.0000 mg | Freq: Once | INTRAMUSCULAR | Status: AC
  Administered 2013-07-26: 4 mg via INTRAVENOUS
  Filled 2013-07-26: qty 2

## 2013-07-26 MED ORDER — BIOTENE DRY MOUTH MT LIQD
15.0000 mL | Freq: Two times a day (BID) | OROMUCOSAL | Status: DC
  Administered 2013-07-28 – 2013-07-31 (×5): 15 mL via OROMUCOSAL

## 2013-07-26 MED ORDER — RIVASTIGMINE 9.5 MG/24HR TD PT24
9.5000 mg | MEDICATED_PATCH | Freq: Every day | TRANSDERMAL | Status: DC
  Administered 2013-07-27 – 2013-07-31 (×5): 9.5 mg via TRANSDERMAL
  Filled 2013-07-26 (×6): qty 1

## 2013-07-26 MED ORDER — MORPHINE SULFATE 2 MG/ML IJ SOLN
1.0000 mg | INTRAMUSCULAR | Status: DC | PRN
  Administered 2013-07-27 – 2013-07-29 (×10): 1 mg via INTRAVENOUS
  Filled 2013-07-26 (×10): qty 1

## 2013-07-26 MED ORDER — INSULIN ASPART 100 UNIT/ML ~~LOC~~ SOLN
0.0000 [IU] | Freq: Three times a day (TID) | SUBCUTANEOUS | Status: DC
Start: 2013-07-27 — End: 2013-07-31
  Administered 2013-07-27: 5 [IU] via SUBCUTANEOUS
  Administered 2013-07-27 – 2013-07-28 (×5): 3 [IU] via SUBCUTANEOUS
  Administered 2013-07-29: 5 [IU] via SUBCUTANEOUS
  Administered 2013-07-29 (×2): 3 [IU] via SUBCUTANEOUS
  Administered 2013-07-31: 2 [IU] via SUBCUTANEOUS
  Administered 2013-07-31: 3 [IU] via SUBCUTANEOUS

## 2013-07-26 MED ORDER — LORAZEPAM 0.5 MG PO TABS
0.5000 mg | ORAL_TABLET | Freq: Two times a day (BID) | ORAL | Status: DC
  Administered 2013-07-26 – 2013-07-30 (×8): 0.5 mg via ORAL
  Filled 2013-07-26 (×9): qty 1

## 2013-07-26 MED ORDER — INSULIN GLARGINE 100 UNIT/ML ~~LOC~~ SOLN: 20.0000 [IU] | Freq: Two times a day (BID) | SUBCUTANEOUS | Status: DC

## 2013-07-26 MED ORDER — VANCOMYCIN HCL IN DEXTROSE 750-5 MG/150ML-% IV SOLN
750.0000 mg | Freq: Two times a day (BID) | INTRAVENOUS | Status: DC
  Administered 2013-07-26 – 2013-07-28 (×4): 750 mg via INTRAVENOUS
  Filled 2013-07-26 (×7): qty 150

## 2013-07-26 MED ORDER — CHLORHEXIDINE GLUCONATE 0.12 % MT SOLN
15.0000 mL | Freq: Two times a day (BID) | OROMUCOSAL | Status: DC
  Administered 2013-07-26 – 2013-07-31 (×9): 15 mL via OROMUCOSAL
  Filled 2013-07-26 (×12): qty 15

## 2013-07-26 MED ORDER — SODIUM CHLORIDE 0.9 % IV SOLN
INTRAVENOUS | Status: DC
  Administered 2013-07-26 – 2013-07-27 (×2): via INTRAVENOUS

## 2013-07-26 MED ORDER — DEXTROSE 5 % IV SOLN: 1.0000 g | Freq: Two times a day (BID) | INTRAVENOUS | Status: DC

## 2013-07-26 MED ORDER — ENOXAPARIN SODIUM 40 MG/0.4ML ~~LOC~~ SOLN
40.0000 mg | SUBCUTANEOUS | Status: DC
  Administered 2013-07-26 – 2013-07-29 (×3): 40 mg via SUBCUTANEOUS
  Filled 2013-07-26 (×5): qty 0.4

## 2013-07-26 MED ORDER — DIVALPROEX SODIUM 125 MG PO CPSP
250.0000 mg | ORAL_CAPSULE | Freq: Two times a day (BID) | ORAL | Status: DC
  Administered 2013-07-26 – 2013-07-31 (×10): 250 mg via ORAL
  Filled 2013-07-26 (×11): qty 2

## 2013-07-26 MED ORDER — DIPHENHYDRAMINE HCL 50 MG/ML IJ SOLN
12.5000 mg | Freq: Once | INTRAMUSCULAR | Status: AC
  Administered 2013-07-26: 12.5 mg via INTRAVENOUS
  Filled 2013-07-26: qty 1

## 2013-07-26 MED ORDER — CLONIDINE HCL 0.3 MG/24HR TD PTWK
0.3000 mg | MEDICATED_PATCH | TRANSDERMAL | Status: DC
  Administered 2013-07-31: 0.3 mg via TRANSDERMAL
  Filled 2013-07-26: qty 1

## 2013-07-26 NOTE — H&P (Signed)
[facility] History and Physical  Dennis Meadows [id number] DOB: 03/24/1935 DOA: 07/26/2013  Referring physician: ED PCP: Reymundo Poll, MD   Chief Complaint:  Fall up at skilled nursing facility  HPI:  78 year old male with history of CAD status post CABG, A. fib not on anticoagulation secondary to history of cerebral hemorrhage, moderate dementia, BPH, hypertension, depression, uncontrolled type 2 diabetes mellitus who was sent from skilled nursing facility [facility]) after having a mechanical fall and landing on his right side. ED PA spoke with the nurse at skilled nursing facility and was informed that patient is normally wheelchair bound and that he tried to walk to the dining room and fell on his with and complaint of pain on his right hip he did patient did not hit his head or have loss of consciousness. He had an x-ray of his right hip which showed fracture of his right femur and was sent to the ED. Patient denies headache, dizziness, fever, chills, nausea , vomiting, chest pain, palpitations, SOB, abdominal pain, bowel or urinary symptoms.   Course in the ED  Patient was found to be  tachypneic respiratory rate in 30s. Will done showed leukocytosis with WBC of 16,000, normal hemoglobin and platelets. Chemistry showed a sodium of 135, chloride of 94 with otherwise normal labs. Blood glclose 268. A CT scan of the head and cervical spine was unremarkable for acute injury. X-ray of the right femur showed displaced intertrochanteric fracture of the proximal right femur. [facility] consulted for admission. Orthopedic surgeon was consulted by ED.  Review of Systems:  Constitutional: Denies fever, chills, diaphoresis, appetite change and fatigue.  HEENT: Denies photophobia, eye pain,  hearing loss, ear pain, congestion, sore throat, rhinorrhea, sneezing,trouble swallowing, neck pain, neck stiffness and tinnitus.   Respiratory: Denies SOB, DOE, cough, chest tightness,  and  wheezing.   Cardiovascular: Denies chest pain, palpitations and leg swelling.  Gastrointestinal: Denies nausea, vomiting, abdominal pain, diarrhea, constipation, blood in stool and abdominal distention.  Genitourinary: Denies dysuria, urgency, frequency, hematuria, flank pain and difficulty urinating.  Endocrine: Denies polyuria, polydipsia. Musculoskeletal: Right hip pain, Denies myalgias, back pain, joint swelling, arthralgias  Neurological: Denies dizziness, seizures, syncope, weakness, light-headedness, numbness and headaches.     Past Medical History  Diagnosis Date  . Dementia   . DNR (do not resuscitate)   . Cerebral hemorrhage   . Hypertension   . Depression   . BPH (benign prostatic hypertrophy)   . Coronary artery disease   . Atrial fibrillation   . Vitamin B 12 deficiency   . C2 cervical fracture   . Former smoker   . Diabetes mellitus     Type 2  . History of congenital ichthyosis    Past Surgical History  Procedure Laterality Date  . Coronary artery bypass graft     Social History:  reports that he has quit smoking. His smoking use included Cigarettes. He smoked 0.00 packs per day. He does not have any smokeless tobacco history on file. He reports that he does not drink alcohol or use illicit drugs.  No Known Allergies  No family history on file.  Prior to Admission medications   Medication Sig Start Date End Date Taking? Authorizing Provider  acetaminophen (TYLENOL) 500 MG tablet Take 500 mg by mouth every 4 (four) hours as needed. For pain   Yes Historical Provider, MD  antiseptic oral rinse (BIOTENE) LIQD 15 mLs by Mouth Rinse route 2 times daily at 12 noon and 4 pm. 04/02/13  Yes Maryann Mikhail, DO  chlorhexidine (PERIDEX) 0.12 % solution 15 mLs by Mouth Rinse route 2 (two) times daily. 04/02/13  Yes Maryann Mikhail, DO  cloNIDine (CATAPRES - DOSED IN MG/24 HR) 0.3 mg/24hr patch Place 0.3 mg onto the skin once a week. Every Friday( weekly) 04/02/13  Yes  Maryann Mikhail, DO  divalproex (DEPAKOTE SPRINKLE) 125 MG capsule Take 250 mg by mouth 2 (two) times daily.    Yes Historical Provider, MD  doxazosin (CARDURA) 2 MG tablet Take 2 mg by mouth daily. 04/02/13  Yes Maryann Mikhail, DO  ergocalciferol (VITAMIN D2) 50000 UNITS capsule Take 50,000 Units by mouth every 30 (thirty) days.   Yes Historical Provider, MD  insulin glargine (LANTUS) 100 UNIT/ML injection Inject 0.1 mLs (10 Units total) into the skin 2 (two) times daily. 04/02/13  Yes Maryann Mikhail, DO  insulin lispro (HUMALOG) 100 UNIT/ML injection Inject into the skin 3 (three) times daily with meals. Sliding scale insulin: 201-250=2 units, 251-300=4 units, 301-350=6 units, 351-400= 8 units, > 400= Call MD   Yes Historical Provider, MD  loperamide (IMODIUM A-D) 2 MG tablet Take 2 mg by mouth as needed. For diarrhea   Yes Historical Provider, MD  LORazepam (ATIVAN) 0.5 MG tablet Take 0.5 mg by mouth 2 (two) times daily.    Yes Historical Provider, MD  magnesium hydroxide (MILK OF MAGNESIA) 400 MG/5ML suspension Take 30 mLs by mouth at bedtime as needed. For constipation   Yes Historical Provider, MD  metoprolol tartrate (LOPRESSOR) 25 MG tablet Take 1 tablet (25 mg total) by mouth 2 (two) times daily. 04/02/13  Yes Maryann Mikhail, DO  mirtazapine (REMERON) 15 MG tablet Take 15 mg by mouth at bedtime.   Yes Historical Provider, MD  rivastigmine (EXELON) 9.5 mg/24hr Place 1 patch (9.5 mg total) onto the skin daily. 04/02/13  Yes Cristal Ford, DO     Physical Exam:  Filed Vitals:   07/26/13 1847 07/26/13 1848 07/26/13 1849 07/26/13 2000  BP: 152/69   149/74  Pulse: 77 80 79 92  Temp:      TempSrc:      Resp: 23 34 24 35  SpO2: 100% 99% 100% 96%    Constitutional: Vital signs reviewed. Elderly male lying in bed in no acute distress  HEENT: no pallor, no icterus, moist oral mucosa,  Cardiovascular:  S1  S2  irregularly irregular no MRG Chest: tachypnic, CTAB, no wheezes, rales, or  rhonchi Abdominal: Soft. Non-tender, non-distended, bowel sounds are normal, no masses, organomegaly, or guarding present.  Ext: warm, no edema, painful ROM of right hip  Neurological: A&O x2, non focal  Labs on Admission:  Basic Metabolic Panel:  Recent Labs Lab 07/26/13 1615  NA 135*  K 4.6  CL 94*  CO2 19  GLUCOSE 268*  BUN 21  CREATININE 0.73  CALCIUM 9.2   Liver Function Tests:  Recent Labs Lab 07/26/13 1615  AST 17  ALT 25  ALKPHOS 64  BILITOT 0.8  PROT 7.2  ALBUMIN 3.4*   No results found for this basename: LIPASE, AMYLASE,  in the last 168 hours No results found for this basename: AMMONIA,  in the last 168 hours CBC:  Recent Labs Lab 07/26/13 1615  WBC 16.1*  HGB 15.8  HCT 46.5  MCV 91.4  PLT 209   Cardiac Enzymes: No results found for this basename: CKTOTAL, CKMB, CKMBINDEX, TROPONINI,  in the last 168 hours BNP: No components found with this basename: POCBNP,  CBG:  Recent Labs  Lab 07/26/13 1610  GLUCAP 254*    Radiological Exams on Admission: Dg Pelvis 1-2 Views  07/26/2013   CLINICAL DATA:  Fall, right hip pain  EXAM: PELVIS - 1-2 VIEW  COMPARISON:  None.  FINDINGS: Single frontal view of the pelvis submitted. Atherosclerotic calcifications bilateral femoral artery. There is mild displaced intertrochanteric fracture of proximal right femur. Mild displaced lesser trochanter bony fragment.  IMPRESSION: Mild displaced intertrochanteric fracture proximal right femur.   Electronically Signed   By: Lahoma Crocker M.D.   On: 07/26/2013 16:42   Dg Femur Right  07/26/2013   CLINICAL DATA:  Fall, right hip pain  EXAM: RIGHT FEMUR - 2 VIEW  COMPARISON:  None.  FINDINGS: Four views of the right femur submitted. Osteopenia is noted. Extensive atherosclerotic calcifications of femoral artery. There is displaced comminuted intertrochanteric fracture of proximal right femur.  IMPRESSION: Displaced intertrochanteric fracture of the proximal right femur.    Electronically Signed   By: Lahoma Crocker M.D.   On: 07/26/2013 16:41   Ct Head Wo Contrast  07/26/2013   CLINICAL DATA:  [place] this morning. Patient reports hitting head. Denies loss of consciousness.  EXAM: CT HEAD WITHOUT CONTRAST  CT CERVICAL SPINE WITHOUT CONTRAST  TECHNIQUE: Multidetector CT imaging of the head and cervical spine was performed following the standard protocol without intravenous contrast. Multiplanar CT image reconstructions of the cervical spine were also generated.  COMPARISON:  03/30/2013 and 03/29/2013.  FINDINGS: CT HEAD FINDINGS  Ventricles are normal in configuration. There is ventricular and sulcal enlargement reflecting moderate atrophy. Patchy white matter hypoattenuation is noted most consistent with moderate to advanced chronic microvascular ischemic change no parenchymal masses or mass effect there is no evidence of a recent cortical infarct. An old infarct is noted along the inferior left cerebellum.  No extra-axial masses or abnormal fluid collections.  No intracranial hemorrhage.  Right mastoid air cells and most of the right middle ear cavity are opacified. Mild ethmoid sinus and anterior sphenoid sinus anti inferior frontal sinus mucosal thickening. Clear left mastoid air cells and middle ear cavity.  No skull fracture.  CT CERVICAL SPINE FINDINGS  No fracture. No spondylolisthesis. There are degenerative changes most notably of the facet joints. The bones are extensively demineralized. No convincing disc herniation. No significant central stenosis. There are varying degrees of neural foraminal narrowing.  Soft tissues are unremarkable.  As noted on the head CT, most of the right middle ear cavity is opacified as are most of the right mastoid air cells there is no convincing bone destruction.  Lung apices show mild peripheral scarring but are otherwise clear.  IMPRESSION: HEAD CT: No acute intracranial abnormality. Moderate atrophy and advanced chronic microvascular ischemic  change. Old left cerebellar infarct.  Opacification of most of the right mastoid air cells as well as most of the right middle ear cavity without bone destruction. Some opacification was present previously, but this has increased. This warrants clinical correlation.  CERVICAL CT: No fracture or acute finding. No change from the prior study.   Electronically Signed   By: Lajean Manes M.D.   On: 07/26/2013 18:48   Ct Cervical Spine Wo Contrast  07/26/2013   CLINICAL DATA:  [place] this morning. Patient reports hitting head. Denies loss of consciousness.  EXAM: CT HEAD WITHOUT CONTRAST  CT CERVICAL SPINE WITHOUT CONTRAST  TECHNIQUE: Multidetector CT imaging of the head and cervical spine was performed following the standard protocol without intravenous contrast. Multiplanar CT image reconstructions of the  cervical spine were also generated.  COMPARISON:  03/30/2013 and 03/29/2013.  FINDINGS: CT HEAD FINDINGS  Ventricles are normal in configuration. There is ventricular and sulcal enlargement reflecting moderate atrophy. Patchy white matter hypoattenuation is noted most consistent with moderate to advanced chronic microvascular ischemic change no parenchymal masses or mass effect there is no evidence of a recent cortical infarct. An old infarct is noted along the inferior left cerebellum.  No extra-axial masses or abnormal fluid collections.  No intracranial hemorrhage.  Right mastoid air cells and most of the right middle ear cavity are opacified. Mild ethmoid sinus and anterior sphenoid sinus anti inferior frontal sinus mucosal thickening. Clear left mastoid air cells and middle ear cavity.  No skull fracture.  CT CERVICAL SPINE FINDINGS  No fracture. No spondylolisthesis. There are degenerative changes most notably of the facet joints. The bones are extensively demineralized. No convincing disc herniation. No significant central stenosis. There are varying degrees of neural foraminal narrowing.  Soft tissues are  unremarkable.  As noted on the head CT, most of the right middle ear cavity is opacified as are most of the right mastoid air cells there is no convincing bone destruction.  Lung apices show mild peripheral scarring but are otherwise clear.  IMPRESSION: HEAD CT: No acute intracranial abnormality. Moderate atrophy and advanced chronic microvascular ischemic change. Old left cerebellar infarct.  Opacification of most of the right mastoid air cells as well as most of the right middle ear cavity without bone destruction. Some opacification was present previously, but this has increased. This warrants clinical correlation.  CERVICAL CT: No fracture or acute finding. No change from the prior study.   Electronically Signed   By: Lajean Manes M.D.   On: 07/26/2013 18:48    EKG: A flutter at 113, no ST-T changes  Assessment/Plan  Principal Problem:   Femur fracture, right Secondary to mechanical fall as per ED and nursing home staff. Patient ambulates with the help of a walker. No head injury noted. Head CT and CT of the cervical spine negative. -Admit to telemetry. -Seen by orthopedics consult (Dr Latanya Maudlin) who planned on surgery date once cleared medically. -Patient is mildly tachypneic and tachycardic with a flutter/ Afib. -He also has leukocytosis likely secondary to pneumonia seen on chest x-ray. Check UA Check pro BNP. -Given his long-standing A. fib and coronary artery disease would check 2-D echo to evaluate his LV function before medical clearance for surgery. -If 2-D echo abnormal and has persistent A. fib he would need cardiac clearance. - Pain control with Tylenol and when necessary morphine. We'll add when necessary Robaxin for muscle spasms. -Subcutaneous Lovenox for DVT prophylaxis.   Active Problems:  Healthcare associated pneumonia Patient tachypneic on presentation with leukocytosis and chest x-ray showing a right overrule obesity consistent with pneumonia. His O2 sat is normal on  room air. I will place him on empiric vancomycin and cefepime for HCAP. Check blood cx, urine for legionella and strep ag. Monitor o2 sat. -Patient has underlying history of dysphagia. We'll get swallow eval.    Atrial fibrillation/ flutter Patient is an a flutter with heart rate in the 110s. He is on metoprolol at home which we'll continue and titrate dose as needed. He is not a candidate for anticoagulation given history of cerebral hemorrhage. -Check 2-D echo. Monitor on telemetry. -if 2-D echo abnormal and has persistent A. fib Will get cardiology clearance for surgery.    CAD (coronary artery disease) status post CABG Continue metoprolol.  BPH (benign prostatic hyperplasia) Continue Cardura    Dementia, moderate Has history of behavioral changes. Continue remeron and [place] patch.      Uncontrolled Diabetes mellitus  Check A1C. patient on 10 units of Lantus twice a day at the nursing facility. We'll place him on 20 units Lantus at bedtime and sliding scale insulin. monitor fsg.   Diet: clear liquid.  swallow eval in am  DVT prophylaxis: sq lovenox   Code Status:  DNR Family Communication: None at bedside Disposition Plan: return to SNF once stable  Dawnell Bryant, [place] [facility] Pager [phone]  Total time spent on admission :70 minutes  If 7PM-7AM, please contact night-coverage [email or web address] Password [facility] 07/26/2013, 8:32 PM

## 2013-07-26 NOTE — ED Notes (Signed)
Pt noted to have some redness right around and above the IV site after given lopressor. MD and PA made aware and benadryl ordered for patient. Pt in no acute distress at this time.

## 2013-07-26 NOTE — ED Provider Notes (Signed)
CSN: [id number]     Arrival date & time 07/26/13  1514 History   First MD Initiated Contact with Patient 07/26/13 1516     Chief Complaint  Patient presents with  . Fall  . Leg Pain     (Consider location/radiation/quality/duration/timing/severity/associated sxs/prior Treatment) The history is provided by the patient and the nursing home. No language interpreter was used.  Dennis Meadows is a 79 y/o M with PMHx of dementia, cerebral hemorrhage, HTN, depression, BPH, Afib, DM, CAD with CABG presenting to the ED with a mechanical fall that occurred today prior to arrival to the ED - patient lives in nursing home, [facility]. This provider spoke with patient's nurse who reported that patient is normal wheelchair bound, stated that he tried walking to the dining room and fell on his way there and landed on his left side. Reported that patient was expressing pain on his right hip. Nurse reported that patient did not hit his head or have LOC. Reported that imaging was performed at the center and that a fracture of his right femur was identified. Patient knew that he fell and was complaining of right hip pain.  Level 5 caveat.  Past Medical History  Diagnosis Date  . Dementia   . DNR (do not resuscitate)   . Cerebral hemorrhage   . Hypertension   . Depression   . BPH (benign prostatic hypertrophy)   . Coronary artery disease   . Atrial fibrillation   . Vitamin B 12 deficiency   . C2 cervical fracture   . Former smoker   . Diabetes mellitus     Type 2  . History of congenital ichthyosis    Past Surgical History  Procedure Laterality Date  . Coronary artery bypass graft     No family history on file. History  Substance Use Topics  . Smoking status: Former Smoker    Types: Cigarettes  . Smokeless tobacco: Not on file  . Alcohol Use: No    Review of Systems  Unable to perform ROS: Dementia  Level 5 caveat    Allergies  Review of patient's allergies  indicates no known allergies.  Home Medications   Current Outpatient Rx  Name  Route  Sig  Dispense  Refill  . acetaminophen (TYLENOL) 500 MG tablet   Oral   Take 500 mg by mouth every 4 (four) hours as needed. For pain         . antiseptic oral rinse (BIOTENE) LIQD   Mouth Rinse   15 mLs by Mouth Rinse route 2 times daily at 12 noon and 4 pm.         . chlorhexidine (PERIDEX) 0.12 % solution   Mouth Rinse   15 mLs by Mouth Rinse route 2 (two) times daily.   120 mL   0   . cloNIDine (CATAPRES - DOSED IN MG/24 HR) 0.3 mg/24hr patch   Transdermal   Place 0.3 mg onto the skin once a week. Every Friday( weekly)         . divalproex (DEPAKOTE SPRINKLE) 125 MG capsule   Oral   Take 250 mg by mouth 2 (two) times daily.          Marland Kitchen doxazosin (CARDURA) 2 MG tablet   Oral   Take 2 mg by mouth daily.         . ergocalciferol (VITAMIN D2) 50000 UNITS capsule   Oral   Take 50,000 Units by mouth every 30 (thirty) days.         Marland Kitchen  insulin glargine (LANTUS) 100 UNIT/ML injection   Subcutaneous   Inject 0.1 mLs (10 Units total) into the skin 2 (two) times daily.   10 mL   12   . insulin lispro (HUMALOG) 100 UNIT/ML injection   Subcutaneous   Inject into the skin 3 (three) times daily with meals. Sliding scale insulin: 201-250=2 units, 251-300=4 units, 301-350=6 units, 351-400= 8 units, > 400= Call MD         . loperamide (IMODIUM A-D) 2 MG tablet   Oral   Take 2 mg by mouth as needed. For diarrhea         . LORazepam (ATIVAN) 0.5 MG tablet   Oral   Take 0.5 mg by mouth 2 (two) times daily.          . magnesium hydroxide (MILK OF MAGNESIA) 400 MG/5ML suspension   Oral   Take 30 mLs by mouth at bedtime as needed. For constipation         . metoprolol tartrate (LOPRESSOR) 25 MG tablet   Oral   Take 1 tablet (25 mg total) by mouth 2 (two) times daily.         . mirtazapine (REMERON) 15 MG tablet   Oral   Take 15 mg by mouth at bedtime.         .  rivastigmine (EXELON) 9.5 mg/24hr   Transdermal   Place 1 patch (9.5 mg total) onto the skin daily.   30 patch   12    BP 152/69  Pulse 79  Temp(Src) 98.7 F (37.1 C) (Oral)  Resp 24  SpO2 100% Physical Exam  Nursing note and vitals reviewed. Constitutional: He appears well-developed and well-nourished. No distress.  HENT:  Head: Normocephalic and atraumatic.  Mouth/Throat: No oropharyngeal exudate.  Negative hematomas felt upon palpation  Negative facial trauma noted  Dry mucus membranes  Eyes: Conjunctivae and EOM are normal. Pupils are equal, round, and reactive to light. Right eye exhibits no discharge. Left eye exhibits no discharge.  Negative nystagmus  Neck: Normal range of motion. Neck supple. No tracheal deviation present.  Negative neck stiffness Negative nuchal rigidity Negative pain upon palpation to the c-spine  Cardiovascular: Normal rate, regular rhythm and normal heart sounds.  Exam reveals no friction rub.   No murmur heard. Pulses:      Radial pulses are 2+ on the right side, and 2+ on the left side.       Dorsalis pedis pulses are 2+ on the right side, and 2+ on the left side.  Pulmonary/Chest: Effort normal and breath sounds normal. No respiratory distress. He has no wheezes. He has no rales.  Takes intermittent deep breaths when he speaks - this is baseline as per nurse's report  Musculoskeletal: He exhibits tenderness.       Legs: Right lower extremity noted to have decreased leg length with foot everted. Tenderness upon palpation to the right inguinal region and right acetabulum. Patient is unable to move the right hip and knee secondary to pain. Full ROM noted to the left lower extremity.   Lymphadenopathy:    He has no cervical adenopathy.  Neurological: He is alert. No cranial nerve deficit. He exhibits normal muscle tone. Coordination normal.  Alert Oriented x 1 which is patient's baseline [place] when he speaks  Skin: Skin is warm and dry. No  rash noted. He is not diaphoretic. No erythema.    ED Course  Procedures (including critical care time)  3:32 PM This provider  called patient's nursing home - [place]. Spoke with Glenard Haring, patient's RN. Reported that patient is non-complaint and that he gets easily agitated. Reported that when he gets his CBG checked he gets agitated and claws at the patient - gave a warning.   6::42 PM This provider spoke with dr. Rhona Raider regarding case, history, presentation, imaging. Patient to be seen and assessed by orthopedics with hospitalist admission.   7:43 PM This provider spoke with Dr. Clementeen Graham from [facility] - discussed case, history, presentation, labs, imaging great detail. Patient to be admitted to hospital as inpatient to telemetry floor. Orthopedic consult.  Results for orders placed during the hospital encounter of 07/26/13  CBC      Result Value Ref Range   WBC 16.1 (*) 4.0 - 10.5 K/uL   RBC 5.09  4.22 - 5.81 MIL/uL   Hemoglobin 15.8  13.0 - 17.0 g/dL   HCT 46.5  39.0 - 52.0 %   MCV 91.4  78.0 - 100.0 fL   MCH 31.0  26.0 - 34.0 pg   MCHC 34.0  30.0 - 36.0 g/dL   RDW 13.9  11.5 - 15.5 %   Platelets 209  150 - 400 K/uL  COMPREHENSIVE METABOLIC PANEL      Result Value Ref Range   Sodium 135 (*) 137 - 147 mEq/L   Potassium 4.6  3.7 - 5.3 mEq/L   Chloride 94 (*) 96 - 112 mEq/L   CO2 19  19 - 32 mEq/L   Glucose, Bld 268 (*) 70 - 99 mg/dL   BUN 21  6 - 23 mg/dL   Creatinine, Ser 0.73  0.50 - 1.35 mg/dL   Calcium 9.2  8.4 - 10.5 mg/dL   Total Protein 7.2  6.0 - 8.3 g/dL   Albumin 3.4 (*) 3.5 - 5.2 g/dL   AST 17  0 - 37 U/L   ALT 25  0 - 53 U/L   Alkaline Phosphatase 64  39 - 117 U/L   Total Bilirubin 0.8  0.3 - 1.2 mg/dL   GFR calc non Af Amer 88 (*) >90 mL/min   GFR calc Af Amer >90  >90 mL/min  CBG MONITORING, ED      Result Value Ref Range   Glucose-Capillary 254 (*) 70 - 99 mg/dL   Dg Pelvis 1-2 Views  07/26/2013    CLINICAL DATA:  Fall, right hip pain  EXAM: PELVIS - 1-2 VIEW  COMPARISON:  None.  FINDINGS: Single frontal view of the pelvis submitted. Atherosclerotic calcifications bilateral femoral artery. There is mild displaced intertrochanteric fracture of proximal right femur. Mild displaced lesser trochanter bony fragment.  IMPRESSION: Mild displaced intertrochanteric fracture proximal right femur.   Electronically Signed   By: Lahoma Crocker M.D.   On: 07/26/2013 16:42   Dg Femur Right  07/26/2013   CLINICAL DATA:  Fall, right hip pain  EXAM: RIGHT FEMUR - 2 VIEW  COMPARISON:  None.  FINDINGS: Four views of the right femur submitted. Osteopenia is noted. Extensive atherosclerotic calcifications of femoral artery. There is displaced comminuted intertrochanteric fracture of proximal right femur.  IMPRESSION: Displaced intertrochanteric fracture of the proximal right femur.   Electronically Signed   By: Lahoma Crocker M.D.   On: 07/26/2013 16:41   Ct Head Wo Contrast  07/26/2013   CLINICAL DATA:  [place] this morning. Patient reports hitting head. Denies loss of consciousness.  EXAM: CT HEAD WITHOUT CONTRAST  CT CERVICAL SPINE WITHOUT CONTRAST  TECHNIQUE: Multidetector CT imaging of the head and cervical spine was performed following the standard protocol without intravenous contrast. Multiplanar CT image reconstructions of the cervical spine were also generated.  COMPARISON:  03/30/2013 and 03/29/2013.  FINDINGS: CT HEAD FINDINGS  Ventricles are normal in configuration. There is ventricular and sulcal enlargement reflecting moderate atrophy. Patchy white matter hypoattenuation is noted most consistent with moderate to advanced chronic microvascular ischemic change no parenchymal masses or mass effect there is no evidence of a recent cortical infarct. An old infarct is noted along the inferior left cerebellum.  No extra-axial masses or abnormal fluid collections.  No intracranial hemorrhage.  Right mastoid air cells and most of  the right middle ear cavity are opacified. Mild ethmoid sinus and anterior sphenoid sinus anti inferior frontal sinus mucosal thickening. Clear left mastoid air cells and middle ear cavity.  No skull fracture.  CT CERVICAL SPINE FINDINGS  No fracture. No spondylolisthesis. There are degenerative changes most notably of the facet joints. The bones are extensively demineralized. No convincing disc herniation. No significant central stenosis. There are varying degrees of neural foraminal narrowing.  Soft tissues are unremarkable.  As noted on the head CT, most of the right middle ear cavity is opacified as are most of the right mastoid air cells there is no convincing bone destruction.  Lung apices show mild peripheral scarring but are otherwise clear.  IMPRESSION: HEAD CT: No acute intracranial abnormality. Moderate atrophy and advanced chronic microvascular ischemic change. Old left cerebellar infarct.  Opacification of most of the right mastoid air cells as well as most of the right middle ear cavity without bone destruction. Some opacification was present previously, but this has increased. This warrants clinical correlation.  CERVICAL CT: No fracture or acute finding. No change from the prior study.   Electronically Signed   By: Amie Portland M.D.   On: 07/26/2013 18:48   Ct Cervical Spine Wo Contrast  07/26/2013   CLINICAL DATA:  [place] this morning. Patient reports hitting head. Denies loss of consciousness.  EXAM: CT HEAD WITHOUT CONTRAST  CT CERVICAL SPINE WITHOUT CONTRAST  TECHNIQUE: Multidetector CT imaging of the head and cervical spine was performed following the standard protocol without intravenous contrast. Multiplanar CT image reconstructions of the cervical spine were also generated.  COMPARISON:  03/30/2013 and 03/29/2013.  FINDINGS: CT HEAD FINDINGS  Ventricles are normal in configuration. There is ventricular and sulcal enlargement reflecting moderate atrophy. Patchy white matter hypoattenuation is  noted most consistent with moderate to advanced chronic microvascular ischemic change no parenchymal masses or mass effect there is no evidence of a recent cortical infarct. An old infarct is noted along the inferior left cerebellum.  No extra-axial masses or abnormal fluid collections.  No intracranial hemorrhage.  Right mastoid air cells and most of the right middle ear cavity are opacified. Mild ethmoid sinus and anterior sphenoid sinus anti inferior frontal sinus mucosal thickening. Clear left mastoid air cells and middle ear cavity.  No skull fracture.  CT CERVICAL SPINE FINDINGS  No fracture. No spondylolisthesis. There are degenerative changes most notably of the facet joints. The bones are extensively demineralized. No convincing disc herniation. No significant central stenosis. There are varying degrees of neural foraminal narrowing.  Soft tissues are unremarkable.  As noted on the head CT, most of the right middle ear cavity is opacified as are most of the right mastoid air cells there is no convincing bone destruction.  Lung apices show mild peripheral scarring but are  otherwise clear.  IMPRESSION: HEAD CT: No acute intracranial abnormality. Moderate atrophy and advanced chronic microvascular ischemic change. Old left cerebellar infarct.  Opacification of most of the right mastoid air cells as well as most of the right middle ear cavity without bone destruction. Some opacification was present previously, but this has increased. This warrants clinical correlation.  CERVICAL CT: No fracture or acute finding. No change from the prior study.   Electronically Signed   By: Lajean Manes M.D.   On: 07/26/2013 18:48    Labs Review Labs Reviewed  CBC - Abnormal; Notable for the following:    WBC 16.1 (*)    All other components within normal limits  COMPREHENSIVE METABOLIC PANEL - Abnormal; Notable for the following:    Sodium 135 (*)    Chloride 94 (*)    Glucose, Bld 268 (*)    Albumin 3.4 (*)     GFR calc non Af Amer 88 (*)    All other components within normal limits  CBG MONITORING, ED - Abnormal; Notable for the following:    Glucose-Capillary 254 (*)    All other components within normal limits  VALPROIC ACID LEVEL   Imaging Review Dg Pelvis 1-2 Views  07/26/2013   CLINICAL DATA:  Fall, right hip pain  EXAM: PELVIS - 1-2 VIEW  COMPARISON:  None.  FINDINGS: Single frontal view of the pelvis submitted. Atherosclerotic calcifications bilateral femoral artery. There is mild displaced intertrochanteric fracture of proximal right femur. Mild displaced lesser trochanter bony fragment.  IMPRESSION: Mild displaced intertrochanteric fracture proximal right femur.   Electronically Signed   By: Lahoma Crocker M.D.   On: 07/26/2013 16:42   Dg Femur Right  07/26/2013   CLINICAL DATA:  Fall, right hip pain  EXAM: RIGHT FEMUR - 2 VIEW  COMPARISON:  None.  FINDINGS: Four views of the right femur submitted. Osteopenia is noted. Extensive atherosclerotic calcifications of femoral artery. There is displaced comminuted intertrochanteric fracture of proximal right femur.  IMPRESSION: Displaced intertrochanteric fracture of the proximal right femur.   Electronically Signed   By: Lahoma Crocker M.D.   On: 07/26/2013 16:41   Ct Head Wo Contrast  07/26/2013   CLINICAL DATA:  [place] this morning. Patient reports hitting head. Denies loss of consciousness.  EXAM: CT HEAD WITHOUT CONTRAST  CT CERVICAL SPINE WITHOUT CONTRAST  TECHNIQUE: Multidetector CT imaging of the head and cervical spine was performed following the standard protocol without intravenous contrast. Multiplanar CT image reconstructions of the cervical spine were also generated.  COMPARISON:  03/30/2013 and 03/29/2013.  FINDINGS: CT HEAD FINDINGS  Ventricles are normal in configuration. There is ventricular and sulcal enlargement reflecting moderate atrophy. Patchy white matter hypoattenuation is noted most consistent with moderate to advanced chronic microvascular  ischemic change no parenchymal masses or mass effect there is no evidence of a recent cortical infarct. An old infarct is noted along the inferior left cerebellum.  No extra-axial masses or abnormal fluid collections.  No intracranial hemorrhage.  Right mastoid air cells and most of the right middle ear cavity are opacified. Mild ethmoid sinus and anterior sphenoid sinus anti inferior frontal sinus mucosal thickening. Clear left mastoid air cells and middle ear cavity.  No skull fracture.  CT CERVICAL SPINE FINDINGS  No fracture. No spondylolisthesis. There are degenerative changes most notably of the facet joints. The bones are extensively demineralized. No convincing disc herniation. No significant central stenosis. There are varying degrees of neural foraminal narrowing.  Soft tissues are  unremarkable.  As noted on the head CT, most of the right middle ear cavity is opacified as are most of the right mastoid air cells there is no convincing bone destruction.  Lung apices show mild peripheral scarring but are otherwise clear.  IMPRESSION: HEAD CT: No acute intracranial abnormality. Moderate atrophy and advanced chronic microvascular ischemic change. Old left cerebellar infarct.  Opacification of most of the right mastoid air cells as well as most of the right middle ear cavity without bone destruction. Some opacification was present previously, but this has increased. This warrants clinical correlation.  CERVICAL CT: No fracture or acute finding. No change from the prior study.   Electronically Signed   By: Lajean Manes M.D.   On: 07/26/2013 18:48   Ct Cervical Spine Wo Contrast  07/26/2013   CLINICAL DATA:  [place] this morning. Patient reports hitting head. Denies loss of consciousness.  EXAM: CT HEAD WITHOUT CONTRAST  CT CERVICAL SPINE WITHOUT CONTRAST  TECHNIQUE: Multidetector CT imaging of the head and cervical spine was performed following the standard protocol without intravenous contrast. Multiplanar CT  image reconstructions of the cervical spine were also generated.  COMPARISON:  03/30/2013 and 03/29/2013.  FINDINGS: CT HEAD FINDINGS  Ventricles are normal in configuration. There is ventricular and sulcal enlargement reflecting moderate atrophy. Patchy white matter hypoattenuation is noted most consistent with moderate to advanced chronic microvascular ischemic change no parenchymal masses or mass effect there is no evidence of a recent cortical infarct. An old infarct is noted along the inferior left cerebellum.  No extra-axial masses or abnormal fluid collections.  No intracranial hemorrhage.  Right mastoid air cells and most of the right middle ear cavity are opacified. Mild ethmoid sinus and anterior sphenoid sinus anti inferior frontal sinus mucosal thickening. Clear left mastoid air cells and middle ear cavity.  No skull fracture.  CT CERVICAL SPINE FINDINGS  No fracture. No spondylolisthesis. There are degenerative changes most notably of the facet joints. The bones are extensively demineralized. No convincing disc herniation. No significant central stenosis. There are varying degrees of neural foraminal narrowing.  Soft tissues are unremarkable.  As noted on the head CT, most of the right middle ear cavity is opacified as are most of the right mastoid air cells there is no convincing bone destruction.  Lung apices show mild peripheral scarring but are otherwise clear.  IMPRESSION: HEAD CT: No acute intracranial abnormality. Moderate atrophy and advanced chronic microvascular ischemic change. Old left cerebellar infarct.  Opacification of most of the right mastoid air cells as well as most of the right middle ear cavity without bone destruction. Some opacification was present previously, but this has increased. This warrants clinical correlation.  CERVICAL CT: No fracture or acute finding. No change from the prior study.   Electronically Signed   By: Lajean Manes M.D.   On: 07/26/2013 18:48     EKG  Interpretation   Date/Time:  Sunday July 26 2013 16:06:44 EST Ventricular Rate:  113 PR Interval:    QRS Duration: 179 QT Interval:  423 QTC Calculation: 580 R Axis:   133 Text Interpretation:  Atrial flutter Nonspecific intraventricular  conduction delay Borderline repolarization abnormality Confirmed by Jeneen Rinks   MD, [place] ([id number]) on 07/26/2013 4:31:40 PM      MDM   Final diagnoses:  Fall  Femur fracture, right   Medications  morphine 4 MG/ML injection 4 mg (not administered)  sodium chloride 0.9 % bolus 500 mL (0 mLs Intravenous Stopped 07/26/13 1835)  metoprolol (LOPRESSOR) injection 5 mg (5 mg Intravenous Given 07/26/13 1830)  ondansetron (ZOFRAN) injection 4 mg (4 mg Intravenous Given 07/26/13 1830)  sodium chloride 0.9 % bolus 500 mL (0 mLs Intravenous Stopped 07/26/13 1939)  diphenhydrAMINE (BENADRYL) injection 12.5 mg (12.5 mg Intravenous Given 07/26/13 1847)   Filed Vitals:   07/26/13 1543 07/26/13 1847 07/26/13 1848 07/26/13 1849  BP: 125/94 152/69    Pulse: 80 77 80 79  Temp: 98.7 F (37.1 C)     TempSrc: Oral     Resp: 20 23 34 24  SpO2: 96% 100% 99% 100%    Patient presenting to the ED with right hip pain after a mechanical fall occurred while at his nursing home this afternoon - patient was walking to the dining room when he fell, normally wheelchair bound. Patient did not hit head or have LOC. Report from Nurse at nursing home. Patient has history of dementia, level 5 caveat.  Alert. Oriented x 1 - baseline. Lungs clear to auscultation. Heart rate and rhythm normal. Radial pulses 2+. Cap refill < 3 seconds. Right lower extremity noted to have minimal leg length discrepancy with right foot everted - cannot move right lower extremity secondary to pain. Pain upon palpation to the right hip and inguinal region. Full ROM to LLE. Pedal pulses 2+. Strength intact with equal distribution. Negative facial drooping. Cranial nerves grossly intact. Negative facial trauma or hematomas  noted.  EKG noted atrial flutter with nonspecific intraventricular conduction with negative changes to previous EKG with a heart rate of 113 beats per minute. CBC mild elevated WBC of 16. CMP noted mild hyponatremia of 135 with mild low chloride of 94 - mild anion gap of 22.0 mEq per liter, bicarbonate within normal limits. Kidneys function well with BUN of 21 and creatinine 0.73. Plain film of the pelvis noted mild displaced intertrochanteric fracture proximal right femur-by plain film noted displaced intertrochanter fracture the proximal right femur. CT head with no acute intracranial abnormalities noted-moderate atrophy and advanced chronic microvascular ischemia identified. CT cervical spine noted-negative acute fracture or abnormalities noted. This provider spoke with Dr. Rhona Raider, orthopedic surgeon - patient to be seen and assessed. This provider spoke with Dr. hospitalist patient to be admitted to telemetry for as inpatient. Patient presenting with closed right intertrochanter fracture the femur due to a mechanical fall as reported by nursing staff. Patient back and forth between tachycardia and normal heart rate. Patient does have history of Afib, but on no form of chronic anticoagulation therapy secondary to cerebral hemorrhage - on metoprolol for HTN and heart rate control. Patient has DM - does not appear to be in DKA at this moment, secondary to bicarb being within normal limits. Due to complicated history patient will need to be placed in Telemetry - admitted as inpatient to Internal Medicine until cleared for surgery. Patient stable for transfer.  Jamse Mead, PA-C 07/27/13 [address] PA-C 07/27/13 1313  [address] PA-C 07/27/13 1326

## 2013-07-26 NOTE — ED Notes (Signed)
Pt presents via EMS from [facility]. Pt fell this morning and landed on his right leg. Portable x-rays were done at the facility and pt was noted to have a right femur fracture. Pt reports that he also hit his head when he fell. Pt denies any LOC.

## 2013-07-26 NOTE — Consult Note (Addendum)
Melrose Nakayama, MD  Chauncey Cruel, PA-C  Loni Dolly, PA-C                                  [facility]                [address] [place], [place]  [address]   ORTHOPAEDIC CONSULTATION  Dennis Meadows            MRN:  [id number] DOB/SEX:  1934/11/29/male     CHIEF COMPLAINT:  Painful right leg  HISTORY: Dennis Meadows a 79 y.o. male with recent right leg pain and inability to stand.  [facility]) and apparently fell this morning.  Films at facility showed fracture and he was sent to [facility] via ambulance.  Also c/o some head pain.  ORS consulted for management of hip fracture.  Head CT relatively normal (nothing acute).  Baseline demented.  No family with him in ED.  Denies pains other than those listed above.   PAST MEDICAL HISTORY: Patient Active Problem List   Diagnosis Date Noted  . Dementia with behavioral disturbance 07/03/2013  . Pure hypercholesterolemia 05/16/2013  . Type II or unspecified type diabetes mellitus with peripheral circulatory disorders, uncontrolled(250.72) 05/16/2013  . Unspecified vitamin D deficiency 05/16/2013  . Pneumonia 03/31/2013  . Sepsis 03/30/2013  . Diabetes mellitus type 2, uncontrolled 03/30/2013  . Pre-syncope 09/05/2011  . Dysphagia 09/05/2011  . HTN (hypertension) 09/05/2011  . DM (diabetes mellitus) 09/05/2011  . Atrial fibrillation 09/05/2011  . CAD (coronary artery disease) 09/05/2011  . BPH (benign prostatic hyperplasia) 09/05/2011  . Dementia 09/05/2011  . TIA (transient ischemic attack) 09/05/2011   Past Medical History  Diagnosis Date  . Dementia   . DNR (do not resuscitate)   . Cerebral hemorrhage   . Hypertension   . Depression   . BPH (benign prostatic hypertrophy)   . Coronary artery disease   . Atrial fibrillation   . Vitamin B 12 deficiency   . C2 cervical fracture   . Former smoker   . Diabetes mellitus     Type 2  . History of congenital ichthyosis    Past Surgical History  Procedure  Laterality Date  . Coronary artery bypass graft       MEDICATIONS:  Current facility-administered medications:morphine 4 MG/ML injection 4 mg, 4 mg, Intravenous, Q1H PRN, Tanna Furry, MD Current outpatient prescriptions:acetaminophen (TYLENOL) 500 MG tablet, Take 500 mg by mouth every 4 (four) hours as needed. For pain, Disp: , Rfl: ;  antiseptic oral rinse (BIOTENE) LIQD, 15 mLs by Mouth Rinse route 2 times daily at 12 noon and 4 pm., Disp: , Rfl: ;  chlorhexidine (PERIDEX) 0.12 % solution, 15 mLs by Mouth Rinse route 2 (two) times daily., Disp: 120 mL, Rfl: 0 cloNIDine (CATAPRES - DOSED IN MG/24 HR) 0.3 mg/24hr patch, Place 0.3 mg onto the skin once a week. Every Friday( weekly), Disp: , Rfl: ;  divalproex (DEPAKOTE SPRINKLE) 125 MG capsule, Take 250 mg by mouth 2 (two) times daily. , Disp: , Rfl: ;  doxazosin (CARDURA) 2 MG tablet, Take 2 mg by mouth daily., Disp: , Rfl: ;  ergocalciferol (VITAMIN D2) 50000 UNITS capsule, Take 50,000 Units by mouth every 30 (thirty) days., Disp: , Rfl:  insulin glargine (LANTUS) 100 UNIT/ML injection, Inject 0.1 mLs (10 Units total) into the skin 2 (two) times daily., Disp: 10 mL, Rfl: 12;  insulin lispro (HUMALOG)  100 UNIT/ML injection, Inject into the skin 3 (three) times daily with meals. Sliding scale insulin: 201-250=2 units, 251-300=4 units, 301-350=6 units, 351-400= 8 units, > 400= Call MD, Disp: , Rfl:  loperamide (IMODIUM A-D) 2 MG tablet, Take 2 mg by mouth as needed. For diarrhea, Disp: , Rfl: ;  LORazepam (ATIVAN) 0.5 MG tablet, Take 0.5 mg by mouth 2 (two) times daily. , Disp: , Rfl: ;  magnesium hydroxide (MILK OF MAGNESIA) 400 MG/5ML suspension, Take 30 mLs by mouth at bedtime as needed. For constipation, Disp: , Rfl: ;  metoprolol tartrate (LOPRESSOR) 25 MG tablet, Take 1 tablet (25 mg total) by mouth 2 (two) times daily., Disp: , Rfl:  mirtazapine (REMERON) 15 MG tablet, Take 15 mg by mouth at bedtime., Disp: , Rfl: ;  rivastigmine (EXELON) 9.5 mg/24hr,  Place 1 patch (9.5 mg total) onto the skin daily., Disp: 30 patch, Rfl: 12  ALLERGIES:  No Known Allergies  REVIEW OF SYSTEMS: REVIEWED IN DETAIL IN CHART  FAMILY HISTORY:  No family history on file.  SOCIAL HISTORY:   History  Substance Use Topics  . Smoking status: Former Smoker    Types: Cigarettes  . Smokeless tobacco: Not on file  . Alcohol Use: No     EXAMINATION: Vital signs in last 24 hours: Temp:  [98.7 F (37.1 C)] 98.7 F (37.1 C) (03/01 1543) Pulse Rate:  [77-80] 79 (03/01 1849) Resp:  [20-34] 24 (03/01 1849) BP: (125-152)/(69-94) 152/69 mmHg (03/01 1847) SpO2:  [96 %-100 %] 100 % (03/01 1849)  BP 152/69  Pulse 79  Temp(Src) 98.7 F (37.1 C) (Oral)  Resp 24  SpO2 100%  General Appearance:    Alert to person but not place or year, cooperative, no distress, appears stated age  Head:    Normocephalic, without obvious abnormality, atraumatic  Eyes:    PERRL, conjunctiva/corneas clear, EOM's intact, fundi    benign, both eyes       Ears:    Normal TM's and external ear canals, both ears  Nose:   Nares normal, septum midline, mucosa normal, no drainage    or sinus tenderness  Throat:   Lips, mucosa, and tongue normal; teeth and gums normal  Neck:   Supple, symmetrical, trachea midline, no adenopathy;       thyroid:  No enlargement/tenderness/nodules; no carotid   bruit or JVD  Back:     Symmetric, mild kyphosis, ROM normal, no CVA tenderness  Lungs:     Clear to auscultation bilaterally, respirations unlabored  Chest wall:    No tenderness or deformity  Heart:    Irregular rate and rhythm, S1 and S2 normal, no murmur, rub   or gallop  Abdomen:     Soft, non-tender, bowel sounds active all four quadrants,    no masses, no organomegaly  Genitalia:    Did not examine     Extremities:   Extremities normal other than right leg, atraumatic, no cyanosis or edema  Pulses:   2+ and symmetric all extremities  Skin:   Skin color, texture, turgor normal, no rashes or  lesions  Lymph nodes:   Cervical, supraclavicular, and axillary nodes normal  Neurologic:   CNII-XII intact. Normal strength, sensation and reflexes      throughout    Musculoskeletal Exam  right leg SER and painful to ROM, skin OK.  Other three extremities with FROM   DIAGNOSTIC STUDIES: Recent laboratory studies:  Recent Labs  07/26/13 1615  WBC 16.1*  HGB 15.8  HCT 46.5  PLT 209    Recent Labs  07/26/13 1615  NA 135*  K 4.6  CL 94*  CO2 19  BUN 21  CREATININE 0.73  GLUCOSE 268*  CALCIUM 9.2   Lab Results  Component Value Date   INR 1.03 09/05/2011     Recent Radiographic Studies :  Dg Pelvis 1-2 Views  07/26/2013   CLINICAL DATA:  Fall, right hip pain  EXAM: PELVIS - 1-2 VIEW  COMPARISON:  None.  FINDINGS: Single frontal view of the pelvis submitted. Atherosclerotic calcifications bilateral femoral artery. There is mild displaced intertrochanteric fracture of proximal right femur. Mild displaced lesser trochanter bony fragment.  IMPRESSION: Mild displaced intertrochanteric fracture proximal right femur.   Electronically Signed   By: Lahoma Crocker M.D.   On: 07/26/2013 16:42   Dg Femur Right  07/26/2013   CLINICAL DATA:  Fall, right hip pain  EXAM: RIGHT FEMUR - 2 VIEW  COMPARISON:  None.  FINDINGS: Four views of the right femur submitted. Osteopenia is noted. Extensive atherosclerotic calcifications of femoral artery. There is displaced comminuted intertrochanteric fracture of proximal right femur.  IMPRESSION: Displaced intertrochanteric fracture of the proximal right femur.   Electronically Signed   By: Lahoma Crocker M.D.   On: 07/26/2013 16:41   Ct Head Wo Contrast  07/26/2013   CLINICAL DATA:  [place] this morning. Patient reports hitting head. Denies loss of consciousness.  EXAM: CT HEAD WITHOUT CONTRAST  CT CERVICAL SPINE WITHOUT CONTRAST  TECHNIQUE: Multidetector CT imaging of the head and cervical spine was performed following the standard protocol without intravenous  contrast. Multiplanar CT image reconstructions of the cervical spine were also generated.  COMPARISON:  03/30/2013 and 03/29/2013.  FINDINGS: CT HEAD FINDINGS  Ventricles are normal in configuration. There is ventricular and sulcal enlargement reflecting moderate atrophy. Patchy white matter hypoattenuation is noted most consistent with moderate to advanced chronic microvascular ischemic change no parenchymal masses or mass effect there is no evidence of a recent cortical infarct. An old infarct is noted along the inferior left cerebellum.  No extra-axial masses or abnormal fluid collections.  No intracranial hemorrhage.  Right mastoid air cells and most of the right middle ear cavity are opacified. Mild ethmoid sinus and anterior sphenoid sinus anti inferior frontal sinus mucosal thickening. Clear left mastoid air cells and middle ear cavity.  No skull fracture.  CT CERVICAL SPINE FINDINGS  No fracture. No spondylolisthesis. There are degenerative changes most notably of the facet joints. The bones are extensively demineralized. No convincing disc herniation. No significant central stenosis. There are varying degrees of neural foraminal narrowing.  Soft tissues are unremarkable.  As noted on the head CT, most of the right middle ear cavity is opacified as are most of the right mastoid air cells there is no convincing bone destruction.  Lung apices show mild peripheral scarring but are otherwise clear.  IMPRESSION: HEAD CT: No acute intracranial abnormality. Moderate atrophy and advanced chronic microvascular ischemic change. Old left cerebellar infarct.  Opacification of most of the right mastoid air cells as well as most of the right middle ear cavity without bone destruction. Some opacification was present previously, but this has increased. This warrants clinical correlation.  CERVICAL CT: No fracture or acute finding. No change from the prior study.   Electronically Signed   By: Lajean Manes M.D.   On:  07/26/2013 18:48   Ct Cervical Spine Wo Contrast  07/26/2013   CLINICAL DATA:  [place] this morning.  Patient reports hitting head. Denies loss of consciousness.  EXAM: CT HEAD WITHOUT CONTRAST  CT CERVICAL SPINE WITHOUT CONTRAST  TECHNIQUE: Multidetector CT imaging of the head and cervical spine was performed following the standard protocol without intravenous contrast. Multiplanar CT image reconstructions of the cervical spine were also generated.  COMPARISON:  03/30/2013 and 03/29/2013.  FINDINGS: CT HEAD FINDINGS  Ventricles are normal in configuration. There is ventricular and sulcal enlargement reflecting moderate atrophy. Patchy white matter hypoattenuation is noted most consistent with moderate to advanced chronic microvascular ischemic change no parenchymal masses or mass effect there is no evidence of a recent cortical infarct. An old infarct is noted along the inferior left cerebellum.  No extra-axial masses or abnormal fluid collections.  No intracranial hemorrhage.  Right mastoid air cells and most of the right middle ear cavity are opacified. Mild ethmoid sinus and anterior sphenoid sinus anti inferior frontal sinus mucosal thickening. Clear left mastoid air cells and middle ear cavity.  No skull fracture.  CT CERVICAL SPINE FINDINGS  No fracture. No spondylolisthesis. There are degenerative changes most notably of the facet joints. The bones are extensively demineralized. No convincing disc herniation. No significant central stenosis. There are varying degrees of neural foraminal narrowing.  Soft tissues are unremarkable.  As noted on the head CT, most of the right middle ear cavity is opacified as are most of the right mastoid air cells there is no convincing bone destruction.  Lung apices show mild peripheral scarring but are otherwise clear.  IMPRESSION: HEAD CT: No acute intracranial abnormality. Moderate atrophy and advanced chronic microvascular ischemic change. Old left cerebellar infarct.   Opacification of most of the right mastoid air cells as well as most of the right middle ear cavity without bone destruction. Some opacification was present previously, but this has increased. This warrants clinical correlation.  CERVICAL CT: No fracture or acute finding. No change from the prior study.   Electronically Signed   By: Lajean Manes M.D.   On: 07/26/2013 18:48    ASSESSMENT: right hip intertrochanteric fracture   PLAN:  Hip fracture needs to be stabilized to allow him to sit up and get OOB and hopefully stand/walk again.  Has some other medical issues which make his management a bit complicated and certainly appreciate medical team help.  Looks to have UTI on U/A with slightly elevated white count on CBC and diabetes control questionable with elevated glucose currently.  Has atrial fibrillation by history and exam but doesn't appear to be anticoagulated as far as I can tell.  I am ready to do his hip surgery whenever cleared by medical team.  Left a message with his son, Dennis Meadows, at [phone].  Shanece Cochrane G 07/26/2013, 7:23 PM

## 2013-07-26 NOTE — Progress Notes (Signed)
ANTIBIOTIC CONSULT NOTE - INITIAL  Pharmacy Consult for Cefepime/Vancomycin/Rx may adjust abx Indication: HCAP  No Known Allergies  Patient Measurements: Weight: 143 lb 4.8 oz (65 kg)   Vital Signs: Temp: 98 F (36.7 C) (03/01 2052) Temp src: Oral (03/01 2052) BP: 165/70 mmHg (03/01 2052) Pulse Rate: 98 (03/01 2052) Intake/Output from previous day:   Intake/Output from this shift:    Labs:  Recent Labs  07/26/13 1615  WBC 16.1*  HGB 15.8  PLT 209  CREATININE 0.73   The CrCl is unknown because both a height and weight (above a minimum accepted value) are required for this calculation. No results found for this basename: VANCOTROUGH, VANCOPEAK, VANCORANDOM, GENTTROUGH, GENTPEAK, GENTRANDOM, TOBRATROUGH, TOBRAPEAK, TOBRARND, AMIKACINPEAK, AMIKACINTROU, AMIKACIN,  in the last 72 hours   Microbiology: No results found for this or any previous visit (from the past 720 hour(s)).  Medical History: Past Medical History  Diagnosis Date  . Dementia   . DNR (do not resuscitate)   . Cerebral hemorrhage   . Hypertension   . Depression   . BPH (benign prostatic hypertrophy)   . Coronary artery disease   . Atrial fibrillation   . Vitamin B 12 deficiency   . C2 cervical fracture   . Former smoker   . Diabetes mellitus     Type 2  . History of congenital ichthyosis     Medications:  Scheduled:  . [START ON 07/27/2013] antiseptic oral rinse  15 mL Mouth Rinse q12n4p  . chlorhexidine  15 mL Mouth Rinse BID  . cloNIDine  0.3 mg Transdermal Weekly  . divalproex  250 mg Oral BID  . doxazosin  2 mg Oral Daily  . enoxaparin (LOVENOX) injection  40 mg Subcutaneous Q24H  . [START ON 07/27/2013] insulin aspart  0-15 Units Subcutaneous TID WC  . insulin glargine  20 Units Subcutaneous QHS  . LORazepam  0.5 mg Oral BID  . metoprolol tartrate  25 mg Oral BID  . mirtazapine  15 mg Oral QHS  . rivastigmine  9.5 mg Transdermal Daily  . sodium chloride  3 mL Intravenous Q12H    Infusions:  . sodium chloride 75 mL/hr at 07/26/13 2054   PRN: acetaminophen, acetaminophen, docusate sodium, morphine injection, morphine injection Assessment: 79 yo from nursing home with HCAP.  Start Vancomycin/Cefepime per pharmacy   Scr is WNL, 0.73 with CrCl 68 ml/min  WBC elevated 16.1  AF  Blood cultures sent 3/1  CXR consistent w/ PNA on 3/1  Goal of Therapy:  Vancomycin trough 15-20 Cefepime per renal function   Plan:  1.) Cefepime 1 gram IV Q8h 2.) Vancomycin 750 mg IV q12h 3.) monitor renal function, f/u culture, vancomycin troughs as needed  Sukhdeep Wieting, Gaye Alken PharmD Pager #: [phone] 9:16 PM 07/26/2013

## 2013-07-27 ENCOUNTER — Other Ambulatory Visit: Payer: Self-pay | Admitting: Orthopaedic Surgery

## 2013-07-27 ENCOUNTER — Inpatient Hospital Stay (HOSPITAL_COMMUNITY): Payer: Medicare Other

## 2013-07-27 DIAGNOSIS — I517 Cardiomegaly: Secondary | ICD-10-CM

## 2013-07-27 DIAGNOSIS — I10 Essential (primary) hypertension: Secondary | ICD-10-CM

## 2013-07-27 DIAGNOSIS — E1165 Type 2 diabetes mellitus with hyperglycemia: Secondary | ICD-10-CM

## 2013-07-27 DIAGNOSIS — W19XXXA Unspecified fall, initial encounter: Secondary | ICD-10-CM

## 2013-07-27 DIAGNOSIS — IMO0001 Reserved for inherently not codable concepts without codable children: Secondary | ICD-10-CM

## 2013-07-27 DIAGNOSIS — F0390 Unspecified dementia without behavioral disturbance: Secondary | ICD-10-CM

## 2013-07-27 DIAGNOSIS — E440 Moderate protein-calorie malnutrition: Secondary | ICD-10-CM | POA: Insufficient documentation

## 2013-07-27 LAB — URINALYSIS, ROUTINE W REFLEX MICROSCOPIC
BILIRUBIN URINE: NEGATIVE
Glucose, UA: >1000 mg/dL — AB
HGB URINE DIPSTICK: NEGATIVE
Leukocytes, UA: NEGATIVE
Nitrite: NEGATIVE
Protein, ur: NEGATIVE mg/dL
SPECIFIC GRAVITY, URINE: 1.031 — AB (ref 1.005–1.030)
UROBILINOGEN UA: 1 mg/dL (ref 0.0–1.0)
pH: 5.5 (ref 5.0–8.0)

## 2013-07-27 LAB — HEMOGLOBIN A1C
HEMOGLOBIN A1C: 9.8 % — AB (ref <5.7)
MEAN PLASMA GLUCOSE: 235 mg/dL — AB (ref <117)

## 2013-07-27 LAB — CBC
HEMATOCRIT: 42.3 % (ref 39.0–52.0)
Hemoglobin: 14.3 g/dL (ref 13.0–17.0)
MCH: 30.9 pg (ref 26.0–34.0)
MCHC: 33.8 g/dL (ref 30.0–36.0)
MCV: 91.4 fL (ref 78.0–100.0)
PLATELETS: 211 10*3/uL (ref 150–400)
RBC: 4.63 MIL/uL (ref 4.22–5.81)
RDW: 13.9 % (ref 11.5–15.5)
WBC: 13 10*3/uL — ABNORMAL HIGH (ref 4.0–10.5)

## 2013-07-27 LAB — BASIC METABOLIC PANEL
BUN: 10 mg/dL (ref 6–23)
CHLORIDE: 99 meq/L (ref 96–112)
CO2: 25 mEq/L (ref 19–32)
CREATININE: 0.56 mg/dL (ref 0.50–1.35)
Calcium: 8.4 mg/dL (ref 8.4–10.5)
GFR calc non Af Amer: >90 mL/min (ref >90)
Glucose, Bld: 139 mg/dL — ABNORMAL HIGH (ref 70–99)
Potassium: 3.4 mEq/L — ABNORMAL LOW (ref 3.7–5.3)
SODIUM: 137 meq/L (ref 137–147)

## 2013-07-27 LAB — GLUCOSE, CAPILLARY
GLUCOSE-CAPILLARY: 187 mg/dL — AB (ref 70–99)
GLUCOSE-CAPILLARY: 234 mg/dL — AB (ref 70–99)
Glucose-Capillary: 189 mg/dL — ABNORMAL HIGH (ref 70–99)
Glucose-Capillary: 135 mg/dL — ABNORMAL HIGH (ref 70–99)

## 2013-07-27 LAB — LEGIONELLA ANTIGEN, URINE: LEGIONELLA ANTIGEN, URINE: NEGATIVE

## 2013-07-27 LAB — MAGNESIUM: MAGNESIUM: 1.6 mg/dL (ref 1.5–2.5)

## 2013-07-27 LAB — STREP PNEUMONIAE URINARY ANTIGEN: STREP PNEUMO URINARY ANTIGEN: NEGATIVE

## 2013-07-27 LAB — TROPONIN I

## 2013-07-27 LAB — URINE MICROSCOPIC-ADD ON: URINE-OTHER: NONE SEEN

## 2013-07-27 LAB — MRSA PCR SCREENING: MRSA BY PCR: INVALID — AB

## 2013-07-27 MED ORDER — ALBUTEROL SULFATE HFA 108 (90 BASE) MCG/ACT IN AERS: 2.0000 | INHALATION_SPRAY | RESPIRATORY_TRACT | Status: DC | PRN

## 2013-07-27 MED ORDER — METOPROLOL TARTRATE 1 MG/ML IV SOLN
5.0000 mg | INTRAVENOUS | Status: AC
  Administered 2013-07-27: 5 mg via INTRAVENOUS
  Filled 2013-07-27: qty 5

## 2013-07-27 MED ORDER — DILTIAZEM HCL 25 MG/5ML IV SOLN
5.0000 mg | Freq: Once | INTRAVENOUS | Status: AC
  Administered 2013-07-27: 5 mg via INTRAVENOUS
  Filled 2013-07-27: qty 5

## 2013-07-27 MED ORDER — ALBUTEROL SULFATE (2.5 MG/3ML) 0.083% IN NEBU
INHALATION_SOLUTION | RESPIRATORY_TRACT | Status: AC
  Administered 2013-07-27: 2.5 mg
  Filled 2013-07-27: qty 3

## 2013-07-27 MED ORDER — ALBUTEROL SULFATE (2.5 MG/3ML) 0.083% IN NEBU: 2.5000 mg | INHALATION_SOLUTION | RESPIRATORY_TRACT | Status: DC | PRN

## 2013-07-27 NOTE — Progress Notes (Signed)
[facility] PROGRESS NOTE Interim History: 79 year old male with history of CAD status post CABG, A. fib not on anticoagulation secondary to history of cerebral hemorrhage, moderate dementia, BPH, hypertension, depression, uncontrolled type 2 diabetes mellitus who was sent from skilled nursing facility [facility]) after having a mechanical fall and landing on his right side. ED PA spoke with the nurse at skilled nursing facility and was informed that patient is normally wheelchair bound and that he tried to walk to the dining room and fell on his with and complaint of pain on his right hip he did patient did not hit his head or have loss of consciousness. He had an x-ray of his right hip which showed fracture of his right femur and was sent to the ED  Assessment/Plan: Femur fracture, right/ Leucocytosis - Mod-to-high risk for cardiopulmonary complication. - Consult ortho for possible surgery. - Pre-op clearance awaiting for ECHO. - I have d/w risk and benefits with him about proceeding with surgery. Given the risk and benefit I think it is prudent to proceed with surgical evaluation and intervention after a few days of IV antibiotics and improvement in O requirements.  HCAP (healthcare-associated pneumonia) - On Vanc and Cefepime. - Leukocytosis improving. Sat > 94% on 4 L. Will titrate O2 down, uses O2 at home. - Sputum cultures pending.  Type II or unspecified type diabetes mellitus with peripheral circulatory disorders, uncontrolled(250.72) - HbgA1c 9.8 start + SSI.  HTN (hypertension) - Bp stable cont current meds.  Dementia - Continue remeron and [place] patch.  CAD (coronary artery disease)  Atrial fibrillation - rate controlled. No on anticoagulation due to Hx of cerebral hemorrhage  -  Metoprolol orla and IV. Echo pending.  BPH (benign prostatic hyperplasia) - Continue Cardura   Code Status: DNR Family Communication: none  Disposition Plan:  inpatinet   Consultants:  Ortho  Procedures:  Echo  Antibiotics:  Vanc and cefepime 3.1.2015  HPI/Subjective: complaining of hip pain  Objective: Filed Vitals:   07/26/13 2054 07/27/13 0418 07/27/13 0447 07/27/13 0608  BP:  134/81    Pulse:  96    Temp:  98.3 F (36.8 C)    TempSrc:  Oral    Resp:  47  14  Height: 5' 4.96" (1.65 m)     Weight: 65 kg (143 lb 4.8 oz)     SpO2:  96% 96% 97%    Intake/Output Summary (Last 24 hours) at 07/27/13 0734 Last data filed at 07/27/13 0865  Gross per 24 hour  Intake 870.42 ml  Output    480 ml  Net 390.42 ml   Filed Weights   07/26/13 2054  Weight: 65 kg (143 lb 4.8 oz)    Exam:  General: Alert, awake, oriented x3, in no acute distress.  HEENT: No bruits, no goiter. - JVD Heart: irregular rate and rhythm, without murmurs. Lungs: Good air movement, clear to auscultation Abdomen: Soft, nontender, nondistended, positive bowel sounds.     Data Reviewed: Basic Metabolic Panel:  Recent Labs Lab 07/26/13 1615  NA 135*  K 4.6  CL 94*  CO2 19  GLUCOSE 268*  BUN 21  CREATININE 0.73  CALCIUM 9.2   Liver Function Tests:  Recent Labs Lab 07/26/13 1615  AST 17  ALT 25  ALKPHOS 64  BILITOT 0.8  PROT 7.2  ALBUMIN 3.4*   No results found for this basename: LIPASE, AMYLASE,  in the last 168 hours No results found for this basename: AMMONIA,  in the  last 168 hours CBC:  Recent Labs Lab 07/26/13 1615 07/27/13 0345  WBC 16.1* 13.0*  HGB 15.8 14.3  HCT 46.5 42.3  MCV 91.4 91.4  PLT 209 211   Cardiac Enzymes: No results found for this basename: CKTOTAL, CKMB, CKMBINDEX, TROPONINI,  in the last 168 hours BNP (last 3 results)  Recent Labs  07/26/13 2036  PROBNP 1689.0*   CBG:  Recent Labs Lab 07/26/13 1610 07/26/13 2126  GLUCAP 254* 260*    Recent Results (from the past 240 hour(s))  MRSA PCR SCREENING     Status: None   Collection Time    07/26/13  9:24 PM      Result Value Ref Range  Status   MRSA by PCR NEGATIVE  NEGATIVE Final   Comment:            The GeneXpert MRSA Assay (FDA     approved for NASAL specimens     only), is one component of a     comprehensive MRSA colonization     surveillance program. It is not     intended to diagnose MRSA     infection nor to guide or     monitor treatment for     MRSA infections.     Studies: Dg Pelvis 1-2 Views  07/26/2013   CLINICAL DATA:  Fall, right hip pain  EXAM: PELVIS - 1-2 VIEW  COMPARISON:  None.  FINDINGS: Single frontal view of the pelvis submitted. Atherosclerotic calcifications bilateral femoral artery. There is mild displaced intertrochanteric fracture of proximal right femur. Mild displaced lesser trochanter bony fragment.  IMPRESSION: Mild displaced intertrochanteric fracture proximal right femur.   Electronically Signed   By: Lahoma Crocker M.D.   On: 07/26/2013 16:42   Dg Femur Right  07/26/2013   CLINICAL DATA:  Fall, right hip pain  EXAM: RIGHT FEMUR - 2 VIEW  COMPARISON:  None.  FINDINGS: Four views of the right femur submitted. Osteopenia is noted. Extensive atherosclerotic calcifications of femoral artery. There is displaced comminuted intertrochanteric fracture of proximal right femur.  IMPRESSION: Displaced intertrochanteric fracture of the proximal right femur.   Electronically Signed   By: Lahoma Crocker M.D.   On: 07/26/2013 16:41   Ct Head Wo Contrast  07/26/2013   CLINICAL DATA:  [place] this morning. Patient reports hitting head. Denies loss of consciousness.  EXAM: CT HEAD WITHOUT CONTRAST  CT CERVICAL SPINE WITHOUT CONTRAST  TECHNIQUE: Multidetector CT imaging of the head and cervical spine was performed following the standard protocol without intravenous contrast. Multiplanar CT image reconstructions of the cervical spine were also generated.  COMPARISON:  03/30/2013 and 03/29/2013.  FINDINGS: CT HEAD FINDINGS  Ventricles are normal in configuration. There is ventricular and sulcal enlargement reflecting moderate  atrophy. Patchy white matter hypoattenuation is noted most consistent with moderate to advanced chronic microvascular ischemic change no parenchymal masses or mass effect there is no evidence of a recent cortical infarct. An old infarct is noted along the inferior left cerebellum.  No extra-axial masses or abnormal fluid collections.  No intracranial hemorrhage.  Right mastoid air cells and most of the right middle ear cavity are opacified. Mild ethmoid sinus and anterior sphenoid sinus anti inferior frontal sinus mucosal thickening. Clear left mastoid air cells and middle ear cavity.  No skull fracture.  CT CERVICAL SPINE FINDINGS  No fracture. No spondylolisthesis. There are degenerative changes most notably of the facet joints. The bones are extensively demineralized. No convincing disc herniation. No significant central  stenosis. There are varying degrees of neural foraminal narrowing.  Soft tissues are unremarkable.  As noted on the head CT, most of the right middle ear cavity is opacified as are most of the right mastoid air cells there is no convincing bone destruction.  Lung apices show mild peripheral scarring but are otherwise clear.  IMPRESSION: HEAD CT: No acute intracranial abnormality. Moderate atrophy and advanced chronic microvascular ischemic change. Old left cerebellar infarct.  Opacification of most of the right mastoid air cells as well as most of the right middle ear cavity without bone destruction. Some opacification was present previously, but this has increased. This warrants clinical correlation.  CERVICAL CT: No fracture or acute finding. No change from the prior study.   Electronically Signed   By: Amie Portland M.D.   On: 07/26/2013 18:48   Ct Cervical Spine Wo Contrast  07/26/2013   CLINICAL DATA:  [place] this morning. Patient reports hitting head. Denies loss of consciousness.  EXAM: CT HEAD WITHOUT CONTRAST  CT CERVICAL SPINE WITHOUT CONTRAST  TECHNIQUE: Multidetector CT imaging of the  head and cervical spine was performed following the standard protocol without intravenous contrast. Multiplanar CT image reconstructions of the cervical spine were also generated.  COMPARISON:  03/30/2013 and 03/29/2013.  FINDINGS: CT HEAD FINDINGS  Ventricles are normal in configuration. There is ventricular and sulcal enlargement reflecting moderate atrophy. Patchy white matter hypoattenuation is noted most consistent with moderate to advanced chronic microvascular ischemic change no parenchymal masses or mass effect there is no evidence of a recent cortical infarct. An old infarct is noted along the inferior left cerebellum.  No extra-axial masses or abnormal fluid collections.  No intracranial hemorrhage.  Right mastoid air cells and most of the right middle ear cavity are opacified. Mild ethmoid sinus and anterior sphenoid sinus anti inferior frontal sinus mucosal thickening. Clear left mastoid air cells and middle ear cavity.  No skull fracture.  CT CERVICAL SPINE FINDINGS  No fracture. No spondylolisthesis. There are degenerative changes most notably of the facet joints. The bones are extensively demineralized. No convincing disc herniation. No significant central stenosis. There are varying degrees of neural foraminal narrowing.  Soft tissues are unremarkable.  As noted on the head CT, most of the right middle ear cavity is opacified as are most of the right mastoid air cells there is no convincing bone destruction.  Lung apices show mild peripheral scarring but are otherwise clear.  IMPRESSION: HEAD CT: No acute intracranial abnormality. Moderate atrophy and advanced chronic microvascular ischemic change. Old left cerebellar infarct.  Opacification of most of the right mastoid air cells as well as most of the right middle ear cavity without bone destruction. Some opacification was present previously, but this has increased. This warrants clinical correlation.  CERVICAL CT: No fracture or acute finding. No  change from the prior study.   Electronically Signed   By: Amie Portland M.D.   On: 07/26/2013 18:48   Dg Chest Port 1 View  07/27/2013   CLINICAL DATA:  Hypoxia  EXAM: PORTABLE CHEST - 1 VIEW  COMPARISON:  Chest x-ray from yesterday  FINDINGS: Low volume lungs with patchy interstitial coarsening bilaterally. No increasing density in the right upper lung, the site of previously suspected pneumonia. No effusion or pneumothorax. Chronic cardiomegaly. Status post CABG. Posttraumatic appearance of the left lateral ribs.  IMPRESSION: Probable chronic lung disease. Hypoaeration without definite edema or consolidation.   Electronically Signed   By: Audry Riles.D.  On: 07/27/2013 04:53   Dg Chest Port 1 View  07/26/2013   CLINICAL DATA:  Weakness, shortness of breath and leukocytosis  EXAM: PORTABLE CHEST - 1 VIEW  COMPARISON:  03/31/2013  FINDINGS: There is hazy airspace opacity that projects centrally in the right upper lobe. There is bilateral bronchovascular prominence that is accentuated by low lung volumes. Lungs are otherwise clear. No pleural effusion or pneumothorax.  Changes from CABG surgery are stable. Cardiac silhouette is partly obscured. Is mildly enlarged. No mediastinal or hilar masses.  Bony thorax is diffusely demineralized.  IMPRESSION: Hazy right upper lobe opacity is consistent with pneumonia in the proper clinical setting. No overt pulmonary edema.   Electronically Signed   By: Lajean Manes M.D.   On: 07/26/2013 20:36    Scheduled Meds: . antiseptic oral rinse  15 mL Mouth Rinse q12n4p  . ceFEPime (MAXIPIME) IV  1 g Intravenous 3 times per day  . chlorhexidine  15 mL Mouth Rinse BID  . [START ON 07/31/2013] cloNIDine  0.3 mg Transdermal Weekly  . divalproex  250 mg Oral BID  . doxazosin  2 mg Oral Daily  . enoxaparin (LOVENOX) injection  40 mg Subcutaneous Q24H  . insulin aspart  0-15 Units Subcutaneous TID WC  . insulin glargine  20 Units Subcutaneous QHS  . LORazepam  0.5 mg  Oral BID  . metoprolol tartrate  25 mg Oral BID  . mirtazapine  15 mg Oral QHS  . rivastigmine  9.5 mg Transdermal Daily  . sodium chloride  3 mL Intravenous Q12H  . vancomycin  750 mg Intravenous Q12H   Continuous Infusions: . sodium chloride 10 mL/hr at 07/27/13 [place], ABRAHAM  [facility] Pager [phone]. If 8PM-8AM, please contact night-coverage at [email or web address], password [facility] 07/27/2013, 7:34 AM  LOS: 1 day

## 2013-07-27 NOTE — Evaluation (Signed)
SLP Cancellation Note  Patient Details Name: Dennis Meadows MRN: [id number] DOB: 06-Jun-1934   Cancelled treatment:       Reason Eval/Treat Not Completed: Fatigue/lethargy limiting ability to participate (pt asleep, did not awaken adequately for po or evaluation, not on regular/ground/thin at facility, will reattempt eval as schedule allows)   Luanna Salk, [facility] SLP [phone]

## 2013-07-27 NOTE — Progress Notes (Signed)
INITIAL NUTRITION ASSESSMENT  DOCUMENTATION CODES Per approved criteria  -Non-severe (moderate) malnutrition in the context of chronic illness  Pt meets criteria for moderate MALNUTRITION in the context of chronic illness as evidenced by moderate muscle wasting and subcutaneous fat in clavicle region and patellar region, likely poor PO intake <75% for > one month, weight loss 7.7% in one month   INTERVENTION: -Recommend Glucerna shakes BID as diet advancement tolerated -Diet advancement per MD -Will continue to monitor  NUTRITION DIAGNOSIS: Inadequate oral adequate related to lethargy as evidenced by PO intake <75%.   Goal: Pt to meet >/= 90% of their estimated nutrition needs    Monitor:  Total protein/energy intake, labs, weights, diet advancement  Reason for Assessment: MST  79 y.o. male  Admitting Dx: Femur fracture, right  ASSESSMENT: 79 year old male with history of CAD status post CABG, A. fib not on anticoagulation secondary to history of cerebral hemorrhage, moderate dementia, BPH, hypertension, depression, uncontrolled type 2 diabetes mellitus who was sent from skilled nursing facility [facility]) after having a mechanical fall and landing on his right side. ED PA spoke with the nurse at skilled nursing facility and was informed that patient is normally wheelchair bound and that he tried to walk to the dining room and fell on his with and complaint of pain on his right hip he did patient did not hit his head or have loss of consciousness  -Pt unable to provide food/nutrition hx, was lethargic and un-arouseable -Resident of SNF. On Carb Modified diet d/t uncontrolled DM2. Will recommend to advance to Carb Modified as tolerated. Clear liquid diet for possible surgery -Per discussion with Nursing Student, pt with minimal appetite. Eating <50% of meals and feeding assistance from staff. Tolerating thin liquids and applesauce with meds -Has hx of poor PO intake from previous  RD assessment -Pt with possible wt loss; decreased 9-12 lbs in past 4 months. Has been on Glucerna supplement during previous admit in 03/2013    Nutrition Focused Physical Exam:  Subcutaneous Fat:  Orbital Region: WNL Upper Arm Region: moderate Thoracic and Lumbar Region:WNL  Muscle:  Temple Region: WNL Clavicle Bone Region: moderate Clavicle and Acromion Bone Region: moderate Scapular Bone Region: N/A Dorsal Hand: moderate Patellar Region: moderate Anterior Thigh Region: WNL Posterior Calf Region: WNL  Edema: n/a    Height: Ht Readings from Last 1 Encounters:  07/26/13 5' 4.96" (1.65 m)    Weight: Wt Readings from Last 1 Encounters:  07/26/13 143 lb 4.8 oz (65 kg)    Ideal Body Weight: 136 lbs  % Ideal Body Weight: 105%  Wt Readings from Last 10 Encounters:  07/26/13 143 lb 4.8 oz (65 kg)  07/14/13 155 lb (70.308 kg)  03/31/13 152 lb 9.6 oz (69.219 kg)  09/05/11 144 lb 10 oz (65.6 kg)    Usual Body Weight: unable to determine  % Usual Body Weight: unable to determine  BMI:  Body mass index is 23.88 kg/(m^2). normal  Estimated Nutritional Needs: Kcal: 1750-1950 Protein: 70-80 gram Fluid: >/=1950 ml/daily  Skin: WDL  Diet Order: Clear Liquid  EDUCATION NEEDS: -Education needs addressed   Intake/Output Summary (Last 24 hours) at 07/27/13 1144 Last data filed at 07/27/13 1036  Gross per 24 hour  Intake 873.42 ml  Output    480 ml  Net 393.42 ml    Last BM: 3/1   Labs:   Recent Labs Lab 07/26/13 1615  NA 135*  K 4.6  CL 94*  CO2 19  BUN  21  CREATININE 0.73  CALCIUM 9.2  GLUCOSE 268*    CBG (last 3)   Recent Labs  07/26/13 1610 07/26/13 2126 07/27/13 0747  GLUCAP 254* 260* 189*    Scheduled Meds: . antiseptic oral rinse  15 mL Mouth Rinse q12n4p  . ceFEPime (MAXIPIME) IV  1 g Intravenous 3 times per day  . chlorhexidine  15 mL Mouth Rinse BID  . [START ON 07/31/2013] cloNIDine  0.3 mg Transdermal Weekly  . divalproex   250 mg Oral BID  . doxazosin  2 mg Oral Daily  . enoxaparin (LOVENOX) injection  40 mg Subcutaneous Q24H  . insulin aspart  0-15 Units Subcutaneous TID WC  . insulin glargine  20 Units Subcutaneous QHS  . LORazepam  0.5 mg Oral BID  . metoprolol tartrate  25 mg Oral BID  . mirtazapine  15 mg Oral QHS  . rivastigmine  9.5 mg Transdermal Daily  . sodium chloride  3 mL Intravenous Q12H  . vancomycin  750 mg Intravenous Q12H    Continuous Infusions: . sodium chloride 10 mL/hr at 07/27/13 0457    Past Medical History  Diagnosis Date  . Dementia   . DNR (do not resuscitate)   . Cerebral hemorrhage   . Hypertension   . Depression   . BPH (benign prostatic hypertrophy)   . Coronary artery disease   . Atrial fibrillation   . Vitamin B 12 deficiency   . C2 cervical fracture   . Former smoker   . Diabetes mellitus     Type 2  . History of congenital ichthyosis     Past Surgical History  Procedure Laterality Date  . Coronary artery bypass graft      Atlee Abide MS RD LDN Clinical Dietitian [phone]

## 2013-07-27 NOTE — Progress Notes (Signed)
Pt has invalid MRSA PCR this morning.  Sent out for culture -- per lab.

## 2013-07-27 NOTE — Progress Notes (Signed)
In patient's room earlier after hearing continuous pulse ox alarm. Patient was 85% on room air when he had previously when 96% on room air. Placed nasal cannula on patient but sats did not increase above 93% until O2 turned up to 4 L. Patient was also breathing very shallow at 47 times/minute. [facility] on call paged, CXR ordered and respiratory therapist paged to evaluate patient. PRN albuterol treatment ordered by [facility] on call per RRT and administered. Patient's work of breathing decreased. After an hour, patient's respirations down to 14-16/minute. Patient still satting well on 4 L N/C. Will continue to monitor patient.

## 2013-07-27 NOTE — Progress Notes (Signed)
RN called RT to patient room.  Patient breathing around 40-50 times a minute.  Lung sounds are clear throughout except in LLL where it is diminished.  Will give patient a PRN treatment to try and ease patient work of breathing.  RT will continue to monitor.

## 2013-07-27 NOTE — Progress Notes (Signed)
Echocardiogram 2D Echocardiogram has been performed.  Dennis Meadows 07/27/2013, 1:41 PM

## 2013-07-27 NOTE — Progress Notes (Signed)
Pt had a rough morning when writer arrived to floor about 0700. Pt received 4mg  of IV MS and respirations were reported to be at 50 and 4 l/min O2 had to be applied to keep sats in 90's.  Writer noted pt to be shallow breathing with respirations in 30's.  Pulse ox applied for monitoring. Pt was given some clear fluids, and emotional support, and finally calmed enough to close his eyes and rest quietly.  Through day, writer has been able to decrease O2 down to 2 l/min via [place] with sats 97%. Writer is just giving pt 1 mg MS IV as ordered for c/o pain (facial grimacing and clenching fists).  This ordered dose is effective, as pt is able to sleep after administration.   Foley catheter intact with noted low output.  2-D Echo has been done today with noted EF 20-25%. Pt has very poor appetite, and has only consumed about 10% of each clear liquid meal today (3).  Close monitoring continues.

## 2013-07-27 NOTE — Progress Notes (Signed)
Clinical Social Work Department BRIEF PSYCHOSOCIAL ASSESSMENT 07/27/2013  Patient:  Dennis, Meadows     Account Number:  [id number]     Admit date:  07/26/2013  Clinical Social Worker:  Renold Genta  Date/Time:  07/27/2013 04:13 PM  Referred by:  Physician  Date Referred:  07/27/2013 Referred for  Other - See comment   Other Referral:   Admitted from: [facility] SNF   Interview type:  Family Other interview type:   patient's son, Sonia Side via phone    PSYCHOSOCIAL DATA Living Status:  FACILITY Admitted from facility:  [facility] Level of care:  [place] Primary support name:  Adonys Wildes (son) ph#: [phone] Primary support relationship to patient:  CHILD, ADULT Degree of support available:   good    CURRENT CONCERNS Current Concerns  Post-Acute Placement   Other Concerns:    SOCIAL WORK ASSESSMENT / PLAN CSW received consult that patient was admitted from [facility].   Assessment/plan status:  Information/Referral to [employer] Other assessment/ plan:   Information/referral to community resources:   CSW completed FL2 and faxed information to [facility], confirmed with Bethena Roys that patient would be able to return when ready.    PATIENT'S/FAMILY'S RESPONSE TO PLAN OF CARE: Patient's son states that he had been at [facility] for long term care, he was in the memory care unit there and per nursing can sometimes become agitated. Though patient's nurse today, Abigail Butts reports that he has not had any behaviors during her shift.       Raynaldo Opitz, [facility] Clinical Social Worker cell #: [phone]

## 2013-07-28 DIAGNOSIS — E119 Type 2 diabetes mellitus without complications: Secondary | ICD-10-CM

## 2013-07-28 DIAGNOSIS — I2510 Atherosclerotic heart disease of native coronary artery without angina pectoris: Secondary | ICD-10-CM

## 2013-07-28 DIAGNOSIS — R1310 Dysphagia, unspecified: Secondary | ICD-10-CM

## 2013-07-28 LAB — GLUCOSE, CAPILLARY
GLUCOSE-CAPILLARY: 166 mg/dL — AB (ref 70–99)
Glucose-Capillary: 173 mg/dL — ABNORMAL HIGH (ref 70–99)
Glucose-Capillary: 155 mg/dL — ABNORMAL HIGH (ref 70–99)
Glucose-Capillary: 125 mg/dL — ABNORMAL HIGH (ref 70–99)

## 2013-07-28 LAB — VANCOMYCIN, TROUGH: Vancomycin Tr: 9.6 ug/mL — ABNORMAL LOW (ref 10.0–20.0)

## 2013-07-28 MED ORDER — METOPROLOL TARTRATE 50 MG PO TABS
50.0000 mg | ORAL_TABLET | Freq: Two times a day (BID) | ORAL | Status: DC
  Administered 2013-07-28 – 2013-07-31 (×6): 50 mg via ORAL
  Filled 2013-07-28 (×7): qty 1

## 2013-07-28 MED ORDER — POTASSIUM CHLORIDE CRYS ER 20 MEQ PO TBCR
20.0000 meq | EXTENDED_RELEASE_TABLET | Freq: Once | ORAL | Status: AC
  Administered 2013-07-28: 20 meq via ORAL
  Filled 2013-07-28: qty 1

## 2013-07-28 MED ORDER — VANCOMYCIN HCL IN DEXTROSE 1-5 GM/200ML-% IV SOLN
1000.0000 mg | Freq: Two times a day (BID) | INTRAVENOUS | Status: DC
  Administered 2013-07-28 – 2013-07-30 (×4): 1000 mg via INTRAVENOUS
  Filled 2013-07-28 (×5): qty 200

## 2013-07-28 MED ORDER — METOPROLOL TARTRATE 25 MG PO TABS
25.0000 mg | ORAL_TABLET | Freq: Once | ORAL | Status: AC
  Administered 2013-07-28: 25 mg via ORAL
  Filled 2013-07-28: qty 1

## 2013-07-28 MED ORDER — POTASSIUM CHLORIDE CRYS ER 20 MEQ PO TBCR
40.0000 meq | EXTENDED_RELEASE_TABLET | ORAL | Status: AC
  Administered 2013-07-28 (×2): 40 meq via ORAL
  Filled 2013-07-28 (×2): qty 2

## 2013-07-28 MED ORDER — METOPROLOL TARTRATE 1 MG/ML IV SOLN
5.0000 mg | INTRAVENOUS | Status: DC | PRN
  Administered 2013-07-28 – 2013-07-29 (×3): 5 mg via INTRAVENOUS
  Filled 2013-07-28 (×3): qty 5

## 2013-07-28 NOTE — Consult Note (Signed)
CONSULT NOTE  Date: 07/28/2013               Patient Name:  Dennis Meadows MRN: [id number]  DOB: Feb 05, 1935 Age / Sex: 79 y.o., male        PCP: Reymundo Poll Primary Cardiologist: New/ Crosley Stejskal            Referring Physician: Marguarite Arbour              Reason for Consult: Pre op for hip surgery           History of Present Illness: Patient is a 79 y.o. male with a PMHx of CAD, CABG, dementia, cerebral hemorrhage,  HTN, DM who was admitted to [facility] on 07/26/2013 for evaluation of mechanical fall and right hip pain.   He has chonic AFib.  He has been found to have HCAP pneumonia.   Echo was done yesterday Left ventricle: The cavity size was normal. Wall thickness was normal. Systolic function was severely reduced. The estimated ejection fraction was in the range of 20% to 25%. Diffuse hypokinesis; akinesis of the inferior posterior wall. Doppler parameters are consistent with abnormal left ventricular relaxation (grade 1 diastolic dysfunction). Doppler parameters are consistent with high ventricular filling pressure. - Aortic valve: Valve mobility was mildly restricted. - Mitral valve: Calcified annulus. - Left atrium: The atrium was mildly dilated. - Atrial septum: There was an atrial septal aneurysm      Medications: Outpatient medications: Prescriptions prior to admission  Medication Sig Dispense Refill  . acetaminophen (TYLENOL) 500 MG tablet Take 500 mg by mouth every 4 (four) hours as needed. For pain      . antiseptic oral rinse (BIOTENE) LIQD 15 mLs by Mouth Rinse route 2 times daily at 12 noon and 4 pm.      . chlorhexidine (PERIDEX) 0.12 % solution 15 mLs by Mouth Rinse route 2 (two) times daily.  120 mL  0  . cloNIDine (CATAPRES - DOSED IN MG/24 HR) 0.3 mg/24hr patch Place 0.3 mg onto the skin once a week. Every Friday( weekly)      . divalproex (DEPAKOTE SPRINKLE) 125 MG capsule Take 250 mg by mouth 2 (two) times daily.       Marland Kitchen doxazosin (CARDURA) 2 MG  tablet Take 2 mg by mouth daily.      . ergocalciferol (VITAMIN D2) 50000 UNITS capsule Take 50,000 Units by mouth every 30 (thirty) days.      . insulin glargine (LANTUS) 100 UNIT/ML injection Inject 0.1 mLs (10 Units total) into the skin 2 (two) times daily.  10 mL  12  . insulin lispro (HUMALOG) 100 UNIT/ML injection Inject into the skin 3 (three) times daily with meals. Sliding scale insulin: 201-250=2 units, 251-300=4 units, 301-350=6 units, 351-400= 8 units, > 400= Call MD      . loperamide (IMODIUM A-D) 2 MG tablet Take 2 mg by mouth as needed. For diarrhea      . LORazepam (ATIVAN) 0.5 MG tablet Take 0.5 mg by mouth 2 (two) times daily.       . magnesium hydroxide (MILK OF MAGNESIA) 400 MG/5ML suspension Take 30 mLs by mouth at bedtime as needed. For constipation      . metoprolol tartrate (LOPRESSOR) 25 MG tablet Take 1 tablet (25 mg total) by mouth 2 (two) times daily.      . mirtazapine (REMERON) 15 MG tablet Take 15 mg by mouth at bedtime.      . rivastigmine (  EXELON) 9.5 mg/24hr Place 1 patch (9.5 mg total) onto the skin daily.  30 patch  12    Current medications: Current Facility-Administered Medications  Medication Dose Route Frequency Provider Last Rate Last Dose  . 0.9 %  sodium chloride infusion   Intravenous Continuous Jerrye Bushy Reidler, PA-C 10 mL/hr at 07/27/13 0457    . acetaminophen (TYLENOL) tablet 650 mg  650 mg Oral Q6H PRN Nishant Dhungel, MD   650 mg at 07/27/13 1644   Or  . acetaminophen (TYLENOL) suppository 650 mg  650 mg Rectal Q6H PRN Nishant Dhungel, MD      . albuterol (PROVENTIL) (2.5 MG/3ML) 0.083% nebulizer solution 2.5 mg  2.5 mg Inhalation Q4H PRN Randall M Reidler, PA-C      . antiseptic oral rinse (BIOTENE) solution 15 mL  15 mL Mouth Rinse q12n4p Nishant Dhungel, MD   15 mL at 07/28/13 1200  . ceFEPIme (MAXIPIME) 1 g in dextrose 5 % 50 mL IVPB  1 g Intravenous 3 times per day Gaye Alken Borgerding, RPH   1 g at 07/28/13 0528  . chlorhexidine  (PERIDEX) 0.12 % solution 15 mL  15 mL Mouth Rinse BID Nishant Dhungel, MD   15 mL at 07/28/13 0801  . [START ON 07/31/2013] cloNIDine (CATAPRES - Dosed in mg/24 hr) patch 0.3 mg  0.3 mg Transdermal Weekly Nishant Dhungel, MD      . divalproex (DEPAKOTE SPRINKLE) capsule 250 mg  250 mg Oral BID Nishant Dhungel, MD   250 mg at 07/28/13 1021  . docusate sodium (COLACE) capsule 100 mg  100 mg Oral Daily PRN Nishant Dhungel, MD      . doxazosin (CARDURA) tablet 2 mg  2 mg Oral Daily Nishant Dhungel, MD   2 mg at 07/28/13 1021  . enoxaparin (LOVENOX) injection 40 mg  40 mg Subcutaneous Q24H Nishant Dhungel, MD   40 mg at 07/26/13 2239  . insulin aspart (novoLOG) injection 0-15 Units  0-15 Units Subcutaneous TID WC Nishant Dhungel, MD   3 Units at 07/28/13 1209  . insulin glargine (LANTUS) injection 20 Units  20 Units Subcutaneous QHS Nishant Dhungel, MD   20 Units at 07/27/13 2216  . LORazepam (ATIVAN) tablet 0.5 mg  0.5 mg Oral BID Nishant Dhungel, MD   0.5 mg at 07/28/13 1021  . metoprolol tartrate (LOPRESSOR) tablet 25 mg  25 mg Oral BID Nishant Dhungel, MD   25 mg at 07/28/13 1020  . mirtazapine (REMERON) tablet 15 mg  15 mg Oral QHS Nishant Dhungel, MD   15 mg at 07/27/13 2215  . morphine 2 MG/ML injection 1 mg  1 mg Intravenous Q3H PRN Nishant Dhungel, MD   1 mg at 07/28/13 1040  . rivastigmine (EXELON) 9.5 mg/24hr 9.5 mg  9.5 mg Transdermal Daily Nishant Dhungel, MD   9.5 mg at 07/28/13 1022  . sodium chloride 0.9 % injection 3 mL  3 mL Intravenous Q12H Nishant Dhungel, MD   3 mL at 07/27/13 1036  . vancomycin (VANCOCIN) IVPB 750 mg/150 ml premix  750 mg Intravenous Q12H Gaye Alken Borgerding, RPH   750 mg at 07/28/13 1027     No Known Allergies   Past Medical History  Diagnosis Date  . Dementia   . DNR (do not resuscitate)   . Cerebral hemorrhage   . Hypertension   . Depression   . BPH (benign prostatic hypertrophy)   . Coronary artery disease   . Atrial fibrillation   . Vitamin B 12  deficiency   . C2 cervical fracture   . Former smoker   . Diabetes mellitus     Type 2  . History of congenital ichthyosis     Past Surgical History  Procedure Laterality Date  . Coronary artery bypass graft      No family history on file.  Social History:  reports that he has quit smoking. His smoking use included Cigarettes. He smoked 0.00 packs per day. He does not have any smokeless tobacco history on file. He reports that he does not drink alcohol or use illicit drugs.   Review of Systems: Not obtainable - patient is unable to speak.  Physical Exam: BP 125/58  Pulse 88  Temp(Src) 98.1 F (36.7 C) (Oral)  Resp 18  Ht 5' 4.96" (1.65 m)  Wt 144 lb 13.5 oz (65.7 kg)  BMI 24.13 kg/m2  SpO2 95%  Wt Readings from Last 3 Encounters:  07/28/13 144 lb 13.5 oz (65.7 kg)  07/14/13 155 lb (70.308 kg)  03/31/13 152 lb 9.6 oz (69.219 kg)    General: Vital signs reviewed and noted. chonically ill  Head: Normocephalic, atraumatic, sclera anicteric,   Neck: Supple. Negative for carotid bruits. No JVD   Lungs:  Clear bilaterally, no  wheezes, rales, or rhonchi. Shallow respirations  Heart: Irreg.irreg.  Abdomen:  Soft, non-tender, non-distended with normoactive bowel sounds. No hepatomegaly. No rebound/guarding. No obvious abdominal masses    MSK: Strength and the appear normal for age.   Extremities: No clubbing or cyanosis. No edema.  Distal pedal pulses are 2+ and equal   Neurologic: Occasionally would nod his head in response to questions.   Psych: NA    Lab results: Basic Metabolic Panel:  Recent Labs Lab 07/26/13 1615 07/27/13 2305  NA 135* 137  K 4.6 3.4*  CL 94* 99  CO2 19 25  GLUCOSE 268* 139*  BUN 21 10  CREATININE 0.73 0.56  CALCIUM 9.2 8.4  MG  --  1.6    Liver Function Tests:  Recent Labs Lab 07/26/13 1615  AST 17  ALT 25  ALKPHOS 64  BILITOT 0.8  PROT 7.2  ALBUMIN 3.4*   No results found for this basename: LIPASE, AMYLASE,  in the last  168 hours No results found for this basename: AMMONIA,  in the last 168 hours  CBC:  Recent Labs Lab 07/26/13 1615 07/27/13 0345  WBC 16.1* 13.0*  HGB 15.8 14.3  HCT 46.5 42.3  MCV 91.4 91.4  PLT 209 211    Cardiac Enzymes:  Recent Labs Lab 07/27/13 2305  TROPONINI <0.30    BNP: No components found with this basename: POCBNP,   CBG:  Recent Labs Lab 07/27/13 1211 07/27/13 1709 07/27/13 2109 07/28/13 0731 07/28/13 1154  GLUCAP 187* 234* 135* 173* 166*    Coagulation Studies: No results found for this basename: LABPROT, INR,  in the last 72 hours   Other results: EKG : Afib at 131, LBBB.  Tele: Afib at 99, occasional abberantly conducted beat  Imaging: Dg Pelvis 1-2 Views  07/26/2013   CLINICAL DATA:  Fall, right hip pain  EXAM: PELVIS - 1-2 VIEW  COMPARISON:  None.  FINDINGS: Single frontal view of the pelvis submitted. Atherosclerotic calcifications bilateral femoral artery. There is mild displaced intertrochanteric fracture of proximal right femur. Mild displaced lesser trochanter bony fragment.  IMPRESSION: Mild displaced intertrochanteric fracture proximal right femur.   Electronically Signed   By: Lahoma Crocker M.D.   On: 07/26/2013 16:42  Dg Femur Right  07/26/2013   CLINICAL DATA:  Fall, right hip pain  EXAM: RIGHT FEMUR - 2 VIEW  COMPARISON:  None.  FINDINGS: Four views of the right femur submitted. Osteopenia is noted. Extensive atherosclerotic calcifications of femoral artery. There is displaced comminuted intertrochanteric fracture of proximal right femur.  IMPRESSION: Displaced intertrochanteric fracture of the proximal right femur.   Electronically Signed   By: Natasha Mead M.D.   On: 07/26/2013 16:41   Ct Head Wo Contrast  07/26/2013   CLINICAL DATA:  Larey Seat this morning. Patient reports hitting head. Denies loss of consciousness.  EXAM: CT HEAD WITHOUT CONTRAST  CT CERVICAL SPINE WITHOUT CONTRAST  TECHNIQUE: Multidetector CT imaging of the head and cervical  spine was performed following the standard protocol without intravenous contrast. Multiplanar CT image reconstructions of the cervical spine were also generated.  COMPARISON:  03/30/2013 and 03/29/2013.  FINDINGS: CT HEAD FINDINGS  Ventricles are normal in configuration. There is ventricular and sulcal enlargement reflecting moderate atrophy. Patchy white matter hypoattenuation is noted most consistent with moderate to advanced chronic microvascular ischemic change no parenchymal masses or mass effect there is no evidence of a recent cortical infarct. An old infarct is noted along the inferior left cerebellum.  No extra-axial masses or abnormal fluid collections.  No intracranial hemorrhage.  Right mastoid air cells and most of the right middle ear cavity are opacified. Mild ethmoid sinus and anterior sphenoid sinus anti inferior frontal sinus mucosal thickening. Clear left mastoid air cells and middle ear cavity.  No skull fracture.  CT CERVICAL SPINE FINDINGS  No fracture. No spondylolisthesis. There are degenerative changes most notably of the facet joints. The bones are extensively demineralized. No convincing disc herniation. No significant central stenosis. There are varying degrees of neural foraminal narrowing.  Soft tissues are unremarkable.  As noted on the head CT, most of the right middle ear cavity is opacified as are most of the right mastoid air cells there is no convincing bone destruction.  Lung apices show mild peripheral scarring but are otherwise clear.  IMPRESSION: HEAD CT: No acute intracranial abnormality. Moderate atrophy and advanced chronic microvascular ischemic change. Old left cerebellar infarct.  Opacification of most of the right mastoid air cells as well as most of the right middle ear cavity without bone destruction. Some opacification was present previously, but this has increased. This warrants clinical correlation.  CERVICAL CT: No fracture or acute finding. No change from the  prior study.   Electronically Signed   By: Amie Portland M.D.   On: 07/26/2013 18:48   Ct Cervical Spine Wo Contrast  07/26/2013   CLINICAL DATA:  Larey Seat this morning. Patient reports hitting head. Denies loss of consciousness.  EXAM: CT HEAD WITHOUT CONTRAST  CT CERVICAL SPINE WITHOUT CONTRAST  TECHNIQUE: Multidetector CT imaging of the head and cervical spine was performed following the standard protocol without intravenous contrast. Multiplanar CT image reconstructions of the cervical spine were also generated.  COMPARISON:  03/30/2013 and 03/29/2013.  FINDINGS: CT HEAD FINDINGS  Ventricles are normal in configuration. There is ventricular and sulcal enlargement reflecting moderate atrophy. Patchy white matter hypoattenuation is noted most consistent with moderate to advanced chronic microvascular ischemic change no parenchymal masses or mass effect there is no evidence of a recent cortical infarct. An old infarct is noted along the inferior left cerebellum.  No extra-axial masses or abnormal fluid collections.  No intracranial hemorrhage.  Right mastoid air cells and most of the right middle  ear cavity are opacified. Mild ethmoid sinus and anterior sphenoid sinus anti inferior frontal sinus mucosal thickening. Clear left mastoid air cells and middle ear cavity.  No skull fracture.  CT CERVICAL SPINE FINDINGS  No fracture. No spondylolisthesis. There are degenerative changes most notably of the facet joints. The bones are extensively demineralized. No convincing disc herniation. No significant central stenosis. There are varying degrees of neural foraminal narrowing.  Soft tissues are unremarkable.  As noted on the head CT, most of the right middle ear cavity is opacified as are most of the right mastoid air cells there is no convincing bone destruction.  Lung apices show mild peripheral scarring but are otherwise clear.  IMPRESSION: HEAD CT: No acute intracranial abnormality. Moderate atrophy and advanced  chronic microvascular ischemic change. Old left cerebellar infarct.  Opacification of most of the right mastoid air cells as well as most of the right middle ear cavity without bone destruction. Some opacification was present previously, but this has increased. This warrants clinical correlation.  CERVICAL CT: No fracture or acute finding. No change from the prior study.   Electronically Signed   By: Lajean Manes M.D.   On: 07/26/2013 18:48   Dg Chest Port 1 View  07/27/2013   CLINICAL DATA:  Hypoxia  EXAM: PORTABLE CHEST - 1 VIEW  COMPARISON:  Chest x-ray from yesterday  FINDINGS: Low volume lungs with patchy interstitial coarsening bilaterally. No increasing density in the right upper lung, the site of previously suspected pneumonia. No effusion or pneumothorax. Chronic cardiomegaly. Status post CABG. Posttraumatic appearance of the left lateral ribs.  IMPRESSION: Probable chronic lung disease. Hypoaeration without definite edema or consolidation.   Electronically Signed   By: Jorje Guild M.D.   On: 07/27/2013 04:53   Dg Chest Port 1 View  07/26/2013   CLINICAL DATA:  Weakness, shortness of breath and leukocytosis  EXAM: PORTABLE CHEST - 1 VIEW  COMPARISON:  03/31/2013  FINDINGS: There is hazy airspace opacity that projects centrally in the right upper lobe. There is bilateral bronchovascular prominence that is accentuated by low lung volumes. Lungs are otherwise clear. No pleural effusion or pneumothorax.  Changes from CABG surgery are stable. Cardiac silhouette is partly obscured. Is mildly enlarged. No mediastinal or hilar masses.  Bony thorax is diffusely demineralized.  IMPRESSION: Hazy right upper lobe opacity is consistent with pneumonia in the proper clinical setting. No overt pulmonary edema.   Electronically Signed   By: Lajean Manes M.D.   On: 07/26/2013 20:36        Assessment & Plan:  1. coronary artery disease: The patient has a history of coronary artery disease according to the  chart. I do not have any records. Apparently he is followed at [facility].  His echocardiogram reveals markedly depressed left ventricular systolic function. His ejection fraction is around 25%.  He's not able to give me any history regarding his symptoms. I have no way of knowing whether this is been stable condition for him or not.  Addition, the triad hospitalist thinks that he may have pneumonia and he is being treated for HCAP  In my opinion, he is at high risk for surgical intervention. Unfortunately, without surgery I think that he will develop multiple complications bedridden and immobile and would likely die of those complications.  I've spoken with Dr. Darrick Meigs.  He'll speak to the family to see what their wishes are. We may need to make that a decision  to go ahead and try to  operate in order to increase his mobility and decreased the chances of long-term complications of being a completely bedridden patient.  His prognosis is poor.  Unfortunately there is not much that we have to offer to  decrease his operative morbidity and mortality.       Thayer Headings, Brooke Bonito., MD, [facility] 07/28/2013, 1:12 PM Office - [phone] Pager [phone]

## 2013-07-28 NOTE — Progress Notes (Signed)
Last night at 2215, while patient was giving patient his nighttime medications, patient's HR went into the 130's- 140's and converted into a.fib/ a. flutter. NP was notified that patient had been in NSR since arriving to the floor, but that patient had a history of a.fib and had just been given his PO dose of lopressor 25mg . Patient' BP was 133/66. NP ordered a once time dose of IV lopressor 5 mg and an EKG. EKG showed a.fib and the lopressor was given at 2318, patient's BP was 124/57, and HR was in the 100's-110's. NP then ordered a one time dose of IV cardizem 5mg  push. It was given and at 0001, patient's HR was in the 100's and BP was 133/65. Lab work was ordered as well and NP stated to call back if HR started sustaining in the 110's or if patient converted back into NSR. Patient's potassium came back at 3.4, so NP ordered a one time dose of potassium PO. RN monitored patient closely and ar 0250, patient converted back into NSR. Notified NP. Will continue to monitor patient.

## 2013-07-28 NOTE — Progress Notes (Signed)
ANTIBIOTIC CONSULT NOTE - FOLLOW UP  Pharmacy Consult for Cefepime/Vancomycin/Rx may adjust abx Indication: HCAP  No Known Allergies  Patient Measurements: Height: 5' 4.96" (165 cm) Weight: 144 lb 13.5 oz (65.7 kg) IBW/kg (Calculated) : 61.41   Vital Signs: Temp: 97.5 F (36.4 C) (03/03 1949) Temp src: Oral (03/03 1949) BP: 114/57 mmHg (03/03 2040) Pulse Rate: 108 (03/03 2040) Intake/Output from previous day: 03/02 0701 - 03/03 0700 In: 736.8 [P.O.:120; I.V.:166.8; IV Piggyback:450] Out: 1200 [Urine:1200] Intake/Output from this shift:    Labs:  Recent Labs  07/26/13 1615 07/27/13 0345 07/27/13 2305  WBC 16.1* 13.0*  --   HGB 15.8 14.3  --   PLT 209 211  --   CREATININE 0.73  --  0.56   Estimated Creatinine Clearance: 67.2 ml/min (by C-G formula based on Cr of 0.56).  Recent Labs  07/28/13 2118  Admire 9.6*     Microbiology: Recent Results (from the past 720 hour(s))  MRSA PCR SCREENING     Status: None   Collection Time    07/26/13  9:24 PM      Result Value Ref Range Status   MRSA by PCR NEGATIVE  NEGATIVE Final   Comment:            The GeneXpert MRSA Assay (FDA     approved for NASAL specimens     only), is one component of a     comprehensive MRSA colonization     surveillance program. It is not     intended to diagnose MRSA     infection nor to guide or     monitor treatment for     MRSA infections.  CULTURE, BLOOD (ROUTINE X 2)     Status: None   Collection Time    07/26/13 10:50 PM      Result Value Ref Range Status   Specimen Description BLOOD LEFT ARM   Final   Special Requests BOTTLES DRAWN AEROBIC AND ANAEROBIC 5CC   Final   Culture  Setup Time     Final   Value: 07/27/2013 08:33     Performed at [facility]   Culture     Final   Value:        BLOOD CULTURE RECEIVED NO GROWTH TO DATE CULTURE WILL BE HELD FOR 5 DAYS BEFORE ISSUING A FINAL NEGATIVE REPORT     Performed at [facility]   Report Status PENDING    Incomplete  CULTURE, BLOOD (ROUTINE X 2)     Status: None   Collection Time    07/26/13 11:50 PM      Result Value Ref Range Status   Specimen Description BLOOD RIGHT ARM   Final   Special Requests BOTTLES DRAWN AEROBIC AND ANAEROBIC 8CC   Final   Culture  Setup Time     Final   Value: 07/27/2013 08:33     Performed at [facility]   Culture     Final   Value:        BLOOD CULTURE RECEIVED NO GROWTH TO DATE CULTURE WILL BE HELD FOR 5 DAYS BEFORE ISSUING A FINAL NEGATIVE REPORT     Performed at [facility]   Report Status PENDING   Incomplete  MRSA PCR SCREENING     Status: Abnormal   Collection Time    07/27/13 11:03 AM      Result Value Ref Range Status   MRSA by PCR INVALID RESULTS, SPECIMEN SENT FOR CULTURE (*)  NEGATIVE Final   Comment: RESULT CALLED TO, READ BACK BY AND VERIFIED WITH:     Abigail Butts Hinda Glatter [id number] @ 78 BY J SCOTTON                The GeneXpert MRSA Assay (FDA     approved for NASAL specimens     only), is one component of a     comprehensive MRSA colonization     surveillance program. It is not     intended to diagnose MRSA     infection nor to guide or     monitor treatment for     MRSA infections.     DELTA CHECK NOTED  MRSA CULTURE     Status: None   Collection Time    07/27/13 11:03 AM      Result Value Ref Range Status   Specimen Description NOSE   Final   Special Requests NONE   Final   Culture     Final   Value: NO SUSPICIOUS COLONIES, CONTINUING TO HOLD     Performed at [facility]   Report Status PENDING   Incomplete    Medical History: Past Medical History  Diagnosis Date  . Dementia   . DNR (do not resuscitate)   . Cerebral hemorrhage   . Hypertension   . Depression   . BPH (benign prostatic hypertrophy)   . Coronary artery disease   . Atrial fibrillation   . Vitamin B 12 deficiency   . C2 cervical fracture   . Former smoker   . Diabetes mellitus     Type 2  . History of congenital ichthyosis      Medications:  Scheduled:  . antiseptic oral rinse  15 mL Mouth Rinse q12n4p  . ceFEPime (MAXIPIME) IV  1 g Intravenous 3 times per day  . chlorhexidine  15 mL Mouth Rinse BID  . [START ON 07/31/2013] cloNIDine  0.3 mg Transdermal Weekly  . divalproex  250 mg Oral BID  . doxazosin  2 mg Oral Daily  . enoxaparin (LOVENOX) injection  40 mg Subcutaneous Q24H  . insulin aspart  0-15 Units Subcutaneous TID WC  . insulin glargine  20 Units Subcutaneous QHS  . LORazepam  0.5 mg Oral BID  . metoprolol tartrate  50 mg Oral BID  . mirtazapine  15 mg Oral QHS  . rivastigmine  9.5 mg Transdermal Daily  . sodium chloride  3 mL Intravenous Q12H  . vancomycin  1,000 mg Intravenous Q12H   Infusions:  . sodium chloride 10 mL/hr at 07/27/13 0457   PRN: acetaminophen, acetaminophen, albuterol, docusate sodium, metoprolol, morphine injection Assessment: 79 yo from nursing home with HCAP.  Start Vancomycin/Cefepime per pharmacy   Scr is WNL, 0.56 with CrCl 68 ml/min  WBC elevated 13  AF  Blood cultures sent 3/1  CXR consistent w/ PNA on 3/1  Day #3 Vanc/Cefepime for HCAP  Vancomycin trough = 9.6 mcg/ml (subtherapeutic) on regimen of Vancomycin 750mg  IV q12h  Goal of Therapy:  Vancomycin trough 15-20 Cefepime per renal function   Plan:  1.) Cefepime 1 gram IV Q8h 2.) Increase Vancomycin to 1000 mg IV q12h 3.) monitor renal function, f/u culture, vancomycin troughs as needed  Everette Rank PharmD 10:17 PM 07/28/2013

## 2013-07-28 NOTE — Progress Notes (Signed)
Dr. Acie Fredrickson for Cardiology given update via phone regarding Tele. Changes. Pt now noted on Tele in A Fib with increased rate of 135-140. BP 148/77 New orders received and no other acute changes noted in Pt's assessment at this time. Will moniter Pt closely for any changes.

## 2013-07-28 NOTE — Evaluation (Signed)
Clinical/Bedside Swallow Evaluation Patient Details  Name: Dennis Meadows MRN: [id number] Date of Birth: [birth year]  Today's Date: 07/28/2013 Time: 1005-1029 SLP Time Calculation (min): 24 min  Past Medical History:  Past Medical History  Diagnosis Date  . Dementia   . DNR (do not resuscitate)   . Cerebral hemorrhage   . Hypertension   . Depression   . BPH (benign prostatic hypertrophy)   . Coronary artery disease   . Atrial fibrillation   . Vitamin B 12 deficiency   . C2 cervical fracture   . Former smoker   . Diabetes mellitus     Type 2  . History of congenital ichthyosis    Past Surgical History:  Past Surgical History  Procedure Laterality Date  . Coronary artery bypass graft     HPI:  79 yo male adm to [facility] from [facility] after falling while attempting to walk to dining room - femur fx.  Pt awaiting possible surgery, has complex medical /cardiac hx.  PMH + for dementia, HTN, BPH.  Pt with leukocytosis - but WBC trending down and possible HCAP.  CXR 3/2 negative for acute lung dx.  Head imaging 07/26/13 showed old cerebellar infarct, nothing acute.    Assessment / Plan / Recommendation Clinical Impression  Pt with multiple risk factors impacting asp risk including tachypnea with discomfort, poor positioning due to femur fx prohibiting adequate HOB elevation, cognitive status/dementia, and delayed swallow response.   Pt without clinical indications of aspiration with minimal po observed - SLP ceased intake due to pt's tachypnea.  SLP facilitated session by maximizing position - held pt's head forward/down to maximize airway protection with posture limitiations.  Decreased oral manipulation with decreased labial closure noted - however pt is amenable to verbal cues to close lips and swallow.  Suspect pharyngeal swallow is delayed but pt appeared to effectively protect airway with strict precautions.    Recommend continue clears, recommend family bring in dentures for when pt's  diet is able to be advanced.  SlP to follow briefly for family/pt education and to modify diet if indicated based on pt's needs.    Suspect intake may be poor due to multiple factors including pain, tachypnea, reliance on others for feeding.       Aspiration Risk  Moderate    Diet Recommendation Thin liquid (clears per md)   Liquid Administration via: Straw;Cup;Spoon Medication Administration: Whole meds with puree Compensations: Slow rate;Small sips/bites;Check for pocketing Postural Changes and/or Swallow Maneuvers: Upright 30-60 min after meal (upright as able, have pt tip head forward/down )    Other  Recommendations Oral Care Recommendations: Oral care BID;Oral care before and after PO   Follow Up Recommendations       Frequency and Duration min 2x/week  2 weeks   Pertinent Vitals/Pain Afebrile, decreased      Swallow Study Prior Functional Status   pt resides at [facility] on regular/ground diet    General Date of Onset: 07/28/13 HPI: 79 yo male adm to [facility] from [facility] after falling while attempting to walk to dining room - femur fx.  Pt awaiting possible surgery, has complex medical /cardiac hx.  PMH + for dementia, HTN, BPH.  Pt with leukocytosis - but WBC trending down and possible HCAP.  CXR 3/2 negative for acute lung dx.  Head imaging 07/26/13 showed old cerebellar infarct, nothing acute.  Type of Study: Bedside swallow evaluation Previous Swallow Assessment: none in epic Diet Prior to this Study: Thin liquids (clear  liquids) Temperature Spikes Noted: No Respiratory Status: Nasal cannula (pt had removed oxygen when slp walked into room, nurse tech replaced) History of Recent Intubation: No Behavior/Cognition: Alert;Cooperative;Decreased sustained attention;Requires cueing (pt has dementia) Oral Cavity - Dentition: Dentures, not available;Edentulous (pt reports dentures are at facility) Self-Feeding Abilities: Total assist Patient Positioning: Postural control  interferes with function (pt unable to sit fully upright due to femur fx) Baseline Vocal Quality: Clear Volitional Cough: Cognitively unable to elicit (did not elicit due to concerns for possible pain with femur fx) Volitional Swallow: Unable to elicit    Oral/Motor/Sensory Function Overall Oral Motor/Sensory Function:  (no focal cn deficits, ? minimal decreased labial closure on left)   Ice Chips Ice chips: Not tested   Thin Liquid Thin Liquid: Impaired Presentation: Straw Oral Phase Impairments: Reduced labial seal;Impaired anterior to posterior transit;Reduced lingual movement/coordination Oral Phase Functional Implications: Prolonged oral transit Pharyngeal  Phase Impairments: Suspected delayed Swallow    Nectar Thick Nectar Thick Liquid: Not tested   Honey Thick Honey Thick Liquid: Not tested   Puree Puree: Impaired Presentation: Spoon Oral Phase Impairments: Reduced labial seal;Reduced lingual movement/coordination;Impaired anterior to posterior transit Oral Phase Functional Implications: Prolonged oral transit Pharyngeal Phase Impairments: Suspected delayed Swallow   Solid   GO    Solid: Not tested Other Comments: due to pt being on clear liquids anticipating possible surgery       Luanna Salk, [place] [facility] SLP [phone]

## 2013-07-28 NOTE — Progress Notes (Signed)
[facility] PROGRESS NOTE  Dennis Meadows [id number] DOB: Jun 04, 1934 DOA: 07/26/2013 PCP: Reymundo Poll, MD  Assessment/Plan:  Femur fracture, right  Secondary to mechanical fall, orthopedics was consulted and surgery is admitted pending medical clearance. Will continue morphine 1 mg every 3 hours when necessary for pain. Patient went into A. fib with RVR last night, required IV Cardizem and IV metoprolol and converted back to normal sinus rhythm, echocardiogram shows EF 20-25%, diffuse hypokinesis akinesis of the inferior posterior wall and grade 1 diastolic dysfunction. Cardiology was consulted for further recommendations, at this time cardiology has deemed patient to be high risk for surgery considering multiple medical problems. They don't have any treatment to offer to lower the cardiovascular risk at this time.   Healthcare associated pneumonia  Patient tachypneic on presentation with leukocytosis and chest x-ray showing a right overrule obesity consistent with pneumonia. His O2 sat is normal on room air. empiric vancomycin and cefepime for HCAP. Check blood cx, urine for legionella and strep ag.  Monitor o2 sat.   History of dysphagia Swallowing evaluation done this morning Started on clear liquid diet patient is moderate aspiration risk.   Atrial fibrillation/ flutter  Patient is an a flutter with heart rate in the 110s. He is on metoprolol at home which we'll continue and titrate dose as needed. He is not a candidate for anticoagulation given history of cerebral hemorrhage.     CAD (coronary artery disease) status post CABG  Continue metoprolol.   BPH (benign prostatic hyperplasia)  Continue Cardura   Dementia, moderate  Has history of behavioral changes. Continue remeron and [place] patch.   Uncontrolled Diabetes mellitus  Hemoglobin A1C is 9.8, blood glucose is stable in the hospital. Started on Lantus 20 units at bedtime  sliding scale insulin.     Code  Status: DNR Family Communication: *Called and discussed with patient's son on phone Disposition Plan:  Skilled nursing facility   Consultants:  Orthopedics  Cardiology  Procedures:  None  Antibiotics:  Vancomycin 3/2-  Cefepime 3/2-  HPI/Subjective: Patient seen and examined, admits to having pain in the hip joint.   Objective: Filed Vitals:   07/28/13 1020  BP: 125/58  Pulse: 88  Temp:   Resp:     Intake/Output Summary (Last 24 hours) at 07/28/13 1411 Last data filed at 07/28/13 1000  Gross per 24 hour  Intake 853.83 ml  Output   1200 ml  Net -346.17 ml   Filed Weights   07/26/13 2054 07/28/13 0456  Weight: 65 kg (143 lb 4.8 oz) 65.7 kg (144 lb 13.5 oz)    Exam:         Physical Exam: Head: Normocephalic, atraumatic.  Eyes: No signs of jaundice, EOMI Nose: Mucous membranes dry.  Throat: Oropharynx nonerythematous, no exudate appreciated.  Neck: supple,No deformities, masses, or tenderness noted. Lungs: Normal respiratory effort. B/L Clear to auscultation, no crackles or wheezes.  Heart: Regular RR. S1 and S2 normal  Abdomen: BS normoactive. Soft, Nondistended, non-tender.  Extremities: No pretibial edema, no erythema   Data Reviewed: Basic Metabolic Panel:  Recent Labs Lab 07/26/13 1615 07/27/13 2305  NA 135* 137  K 4.6 3.4*  CL 94* 99  CO2 19 25  GLUCOSE 268* 139*  BUN 21 10  CREATININE 0.73 0.56  CALCIUM 9.2 8.4  MG  --  1.6   Liver Function Tests:  Recent Labs Lab 07/26/13 1615  AST 17  ALT 25  ALKPHOS 64  BILITOT 0.8  PROT 7.2  ALBUMIN 3.4*   No results found for this basename: LIPASE, AMYLASE,  in the last 168 hours No results found for this basename: AMMONIA,  in the last 168 hours CBC:  Recent Labs Lab 07/26/13 1615 07/27/13 0345  WBC 16.1* 13.0*  HGB 15.8 14.3  HCT 46.5 42.3  MCV 91.4 91.4  PLT 209 211   Cardiac Enzymes:  Recent Labs Lab 07/27/13 2305  TROPONINI <0.30   BNP (last 3 results)  Recent  Labs  07/26/13 2036  PROBNP 1689.0*   CBG:  Recent Labs Lab 07/27/13 1211 07/27/13 1709 07/27/13 2109 07/28/13 0731 07/28/13 1154  GLUCAP 187* 234* 135* 173* 166*    Recent Results (from the past 240 hour(s))  MRSA PCR SCREENING     Status: None   Collection Time    07/26/13  9:24 PM      Result Value Ref Range Status   MRSA by PCR NEGATIVE  NEGATIVE Final   Comment:            The GeneXpert MRSA Assay (FDA     approved for NASAL specimens     only), is one component of a     comprehensive MRSA colonization     surveillance program. It is not     intended to diagnose MRSA     infection nor to guide or     monitor treatment for     MRSA infections.  CULTURE, BLOOD (ROUTINE X 2)     Status: None   Collection Time    07/26/13 10:50 PM      Result Value Ref Range Status   Specimen Description BLOOD LEFT ARM   Final   Special Requests BOTTLES DRAWN AEROBIC AND ANAEROBIC 5CC   Final   Culture  Setup Time     Final   Value: 07/27/2013 08:33     Performed at [facility]   Culture     Final   Value:        BLOOD CULTURE RECEIVED NO GROWTH TO DATE CULTURE WILL BE HELD FOR 5 DAYS BEFORE ISSUING A FINAL NEGATIVE REPORT     Performed at [facility]   Report Status PENDING   Incomplete  CULTURE, BLOOD (ROUTINE X 2)     Status: None   Collection Time    07/26/13 11:50 PM      Result Value Ref Range Status   Specimen Description BLOOD RIGHT ARM   Final   Special Requests BOTTLES DRAWN AEROBIC AND ANAEROBIC 8CC   Final   Culture  Setup Time     Final   Value: 07/27/2013 08:33     Performed at [facility]   Culture     Final   Value:        BLOOD CULTURE RECEIVED NO GROWTH TO DATE CULTURE WILL BE HELD FOR 5 DAYS BEFORE ISSUING A FINAL NEGATIVE REPORT     Performed at [facility]   Report Status PENDING   Incomplete  MRSA PCR SCREENING     Status: Abnormal   Collection Time    07/27/13 11:03 AM      Result Value Ref Range Status   MRSA  by PCR INVALID RESULTS, SPECIMEN SENT FOR CULTURE (*) NEGATIVE Final   Comment: RESULT CALLED TO, READ BACK BY AND VERIFIED WITH:     [facility] Hinda Glatter [id number] @ [place]  The GeneXpert MRSA Assay (FDA     approved for NASAL specimens     only), is one component of a     comprehensive MRSA colonization     surveillance program. It is not     intended to diagnose MRSA     infection nor to guide or     monitor treatment for     MRSA infections.     DELTA CHECK NOTED  MRSA CULTURE     Status: None   Collection Time    07/27/13 11:03 AM      Result Value Ref Range Status   Specimen Description NOSE   Final   Special Requests NONE   Final   Culture     Final   Value: NO SUSPICIOUS COLONIES, CONTINUING TO HOLD     Performed at [facility]   Report Status PENDING   Incomplete     Studies: Dg Pelvis 1-2 Views  07/26/2013   CLINICAL DATA:  Fall, right hip pain  EXAM: PELVIS - 1-2 VIEW  COMPARISON:  None.  FINDINGS: Single frontal view of the pelvis submitted. Atherosclerotic calcifications bilateral femoral artery. There is mild displaced intertrochanteric fracture of proximal right femur. Mild displaced lesser trochanter bony fragment.  IMPRESSION: Mild displaced intertrochanteric fracture proximal right femur.   Electronically Signed   By: Lahoma Crocker M.D.   On: 07/26/2013 16:42   Dg Femur Right  07/26/2013   CLINICAL DATA:  Fall, right hip pain  EXAM: RIGHT FEMUR - 2 VIEW  COMPARISON:  None.  FINDINGS: Four views of the right femur submitted. Osteopenia is noted. Extensive atherosclerotic calcifications of femoral artery. There is displaced comminuted intertrochanteric fracture of proximal right femur.  IMPRESSION: Displaced intertrochanteric fracture of the proximal right femur.   Electronically Signed   By: Lahoma Crocker M.D.   On: 07/26/2013 16:41   Ct Head Wo Contrast  07/26/2013   CLINICAL DATA:  [place] this morning. Patient reports hitting head. Denies loss of  consciousness.  EXAM: CT HEAD WITHOUT CONTRAST  CT CERVICAL SPINE WITHOUT CONTRAST  TECHNIQUE: Multidetector CT imaging of the head and cervical spine was performed following the standard protocol without intravenous contrast. Multiplanar CT image reconstructions of the cervical spine were also generated.  COMPARISON:  03/30/2013 and 03/29/2013.  FINDINGS: CT HEAD FINDINGS  Ventricles are normal in configuration. There is ventricular and sulcal enlargement reflecting moderate atrophy. Patchy white matter hypoattenuation is noted most consistent with moderate to advanced chronic microvascular ischemic change no parenchymal masses or mass effect there is no evidence of a recent cortical infarct. An old infarct is noted along the inferior left cerebellum.  No extra-axial masses or abnormal fluid collections.  No intracranial hemorrhage.  Right mastoid air cells and most of the right middle ear cavity are opacified. Mild ethmoid sinus and anterior sphenoid sinus anti inferior frontal sinus mucosal thickening. Clear left mastoid air cells and middle ear cavity.  No skull fracture.  CT CERVICAL SPINE FINDINGS  No fracture. No spondylolisthesis. There are degenerative changes most notably of the facet joints. The bones are extensively demineralized. No convincing disc herniation. No significant central stenosis. There are varying degrees of neural foraminal narrowing.  Soft tissues are unremarkable.  As noted on the head CT, most of the right middle ear cavity is opacified as are most of the right mastoid air cells there is no convincing bone destruction.  Lung apices show mild peripheral scarring but are otherwise clear.  IMPRESSION: HEAD  CT: No acute intracranial abnormality. Moderate atrophy and advanced chronic microvascular ischemic change. Old left cerebellar infarct.  Opacification of most of the right mastoid air cells as well as most of the right middle ear cavity without bone destruction. Some opacification was  present previously, but this has increased. This warrants clinical correlation.  CERVICAL CT: No fracture or acute finding. No change from the prior study.   Electronically Signed   By: Lajean Manes M.D.   On: 07/26/2013 18:48   Ct Cervical Spine Wo Contrast  07/26/2013   CLINICAL DATA:  [place] this morning. Patient reports hitting head. Denies loss of consciousness.  EXAM: CT HEAD WITHOUT CONTRAST  CT CERVICAL SPINE WITHOUT CONTRAST  TECHNIQUE: Multidetector CT imaging of the head and cervical spine was performed following the standard protocol without intravenous contrast. Multiplanar CT image reconstructions of the cervical spine were also generated.  COMPARISON:  03/30/2013 and 03/29/2013.  FINDINGS: CT HEAD FINDINGS  Ventricles are normal in configuration. There is ventricular and sulcal enlargement reflecting moderate atrophy. Patchy white matter hypoattenuation is noted most consistent with moderate to advanced chronic microvascular ischemic change no parenchymal masses or mass effect there is no evidence of a recent cortical infarct. An old infarct is noted along the inferior left cerebellum.  No extra-axial masses or abnormal fluid collections.  No intracranial hemorrhage.  Right mastoid air cells and most of the right middle ear cavity are opacified. Mild ethmoid sinus and anterior sphenoid sinus anti inferior frontal sinus mucosal thickening. Clear left mastoid air cells and middle ear cavity.  No skull fracture.  CT CERVICAL SPINE FINDINGS  No fracture. No spondylolisthesis. There are degenerative changes most notably of the facet joints. The bones are extensively demineralized. No convincing disc herniation. No significant central stenosis. There are varying degrees of neural foraminal narrowing.  Soft tissues are unremarkable.  As noted on the head CT, most of the right middle ear cavity is opacified as are most of the right mastoid air cells there is no convincing bone destruction.  Lung apices show  mild peripheral scarring but are otherwise clear.  IMPRESSION: HEAD CT: No acute intracranial abnormality. Moderate atrophy and advanced chronic microvascular ischemic change. Old left cerebellar infarct.  Opacification of most of the right mastoid air cells as well as most of the right middle ear cavity without bone destruction. Some opacification was present previously, but this has increased. This warrants clinical correlation.  CERVICAL CT: No fracture or acute finding. No change from the prior study.   Electronically Signed   By: Lajean Manes M.D.   On: 07/26/2013 18:48   Dg Chest Port 1 View  07/27/2013   CLINICAL DATA:  Hypoxia  EXAM: PORTABLE CHEST - 1 VIEW  COMPARISON:  Chest x-ray from yesterday  FINDINGS: Low volume lungs with patchy interstitial coarsening bilaterally. No increasing density in the right upper lung, the site of previously suspected pneumonia. No effusion or pneumothorax. Chronic cardiomegaly. Status post CABG. Posttraumatic appearance of the left lateral ribs.  IMPRESSION: Probable chronic lung disease. Hypoaeration without definite edema or consolidation.   Electronically Signed   By: Jorje Guild M.D.   On: 07/27/2013 04:53   Dg Chest Port 1 View  07/26/2013   CLINICAL DATA:  Weakness, shortness of breath and leukocytosis  EXAM: PORTABLE CHEST - 1 VIEW  COMPARISON:  03/31/2013  FINDINGS: There is hazy airspace opacity that projects centrally in the right upper lobe. There is bilateral bronchovascular prominence that is accentuated by low  lung volumes. Lungs are otherwise clear. No pleural effusion or pneumothorax.  Changes from CABG surgery are stable. Cardiac silhouette is partly obscured. Is mildly enlarged. No mediastinal or hilar masses.  Bony thorax is diffusely demineralized.  IMPRESSION: Hazy right upper lobe opacity is consistent with pneumonia in the proper clinical setting. No overt pulmonary edema.   Electronically Signed   By: Lajean Manes M.D.   On: 07/26/2013  20:36    Scheduled Meds: . antiseptic oral rinse  15 mL Mouth Rinse q12n4p  . ceFEPime (MAXIPIME) IV  1 g Intravenous 3 times per day  . chlorhexidine  15 mL Mouth Rinse BID  . [START ON 07/31/2013] cloNIDine  0.3 mg Transdermal Weekly  . divalproex  250 mg Oral BID  . doxazosin  2 mg Oral Daily  . enoxaparin (LOVENOX) injection  40 mg Subcutaneous Q24H  . insulin aspart  0-15 Units Subcutaneous TID WC  . insulin glargine  20 Units Subcutaneous QHS  . LORazepam  0.5 mg Oral BID  . metoprolol tartrate  25 mg Oral BID  . mirtazapine  15 mg Oral QHS  . rivastigmine  9.5 mg Transdermal Daily  . sodium chloride  3 mL Intravenous Q12H  . vancomycin  750 mg Intravenous Q12H   Continuous Infusions: . sodium chloride 10 mL/hr at 07/27/13 0457    Principal Problem:   Femur fracture, right Active Problems:   HTN (hypertension)   Atrial fibrillation   CAD (coronary artery disease)   BPH (benign prostatic hyperplasia)   Dementia   Type II or unspecified type diabetes mellitus with peripheral circulatory disorders, uncontrolled(250.72)   Leucocytosis   HCAP (healthcare-associated pneumonia)   Malnutrition of moderate degree    Time spent: 25 minutes*    [facility] Pager [phone]. If 7PM-7AM, please contact night-coverage at [email or web address], password [facility] 07/28/2013, 2:11 PM  LOS: 2 days

## 2013-07-29 DIAGNOSIS — N400 Enlarged prostate without lower urinary tract symptoms: Secondary | ICD-10-CM

## 2013-07-29 LAB — GLUCOSE, CAPILLARY
GLUCOSE-CAPILLARY: 225 mg/dL — AB (ref 70–99)
GLUCOSE-CAPILLARY: 193 mg/dL — AB (ref 70–99)
Glucose-Capillary: 155 mg/dL — ABNORMAL HIGH (ref 70–99)
Glucose-Capillary: 181 mg/dL — ABNORMAL HIGH (ref 70–99)

## 2013-07-29 LAB — CBC
HCT: 41.3 % (ref 39.0–52.0)
Hemoglobin: 13.8 g/dL (ref 13.0–17.0)
MCH: 30.5 pg (ref 26.0–34.0)
MCHC: 33.4 g/dL (ref 30.0–36.0)
MCV: 91.4 fL (ref 78.0–100.0)
Platelets: 215 10*3/uL (ref 150–400)
RBC: 4.52 MIL/uL (ref 4.22–5.81)
RDW: 13.6 % (ref 11.5–15.5)
WBC: 12 10*3/uL — ABNORMAL HIGH (ref 4.0–10.5)

## 2013-07-29 LAB — MRSA CULTURE

## 2013-07-29 LAB — BASIC METABOLIC PANEL
BUN: 9 mg/dL (ref 6–23)
CALCIUM: 8.7 mg/dL (ref 8.4–10.5)
CO2: 23 meq/L (ref 19–32)
CREATININE: 0.56 mg/dL (ref 0.50–1.35)
Chloride: 99 mEq/L (ref 96–112)
GFR calc Af Amer: >90 mL/min (ref >90)
GFR calc non Af Amer: >90 mL/min (ref >90)
Glucose, Bld: 153 mg/dL — ABNORMAL HIGH (ref 70–99)
Potassium: 4.4 mEq/L (ref 3.7–5.3)
Sodium: 136 mEq/L — ABNORMAL LOW (ref 137–147)

## 2013-07-29 MED ORDER — LISINOPRIL 2.5 MG PO TABS
2.5000 mg | ORAL_TABLET | Freq: Every day | ORAL | Status: DC
  Administered 2013-07-29 – 2013-07-30 (×2): 2.5 mg via ORAL
  Filled 2013-07-29 (×3): qty 1

## 2013-07-29 MED ORDER — DIGOXIN 125 MCG PO TABS
0.1250 mg | ORAL_TABLET | Freq: Every day | ORAL | Status: DC
  Administered 2013-07-29 – 2013-07-31 (×3): 0.125 mg via ORAL
  Filled 2013-07-29 (×3): qty 1

## 2013-07-29 MED ORDER — NITROGLYCERIN 0.2 MG/HR TD PT24
0.2000 mg | MEDICATED_PATCH | Freq: Every day | TRANSDERMAL | Status: DC
  Administered 2013-07-29 – 2013-07-31 (×3): 0.2 mg via TRANSDERMAL
  Filled 2013-07-29 (×3): qty 1

## 2013-07-29 MED ORDER — SODIUM CHLORIDE 0.9 % IV BOLUS (SEPSIS)
250.0000 mL | Freq: Once | INTRAVENOUS | Status: AC
  Administered 2013-07-29: 250 mL via INTRAVENOUS

## 2013-07-29 NOTE — Progress Notes (Signed)
Patient converted back into A. Flutter with a HR in the 120's. BP was checked and it was 110/48. IV lopressor was given. Will continue to monitor.

## 2013-07-29 NOTE — Progress Notes (Signed)
       Patient Name: Dennis Meadows Date of Encounter: 07/29/2013    SUBJECTIVE: No voiced complaints and sleeps if not stimulated  TELEMETRY:  Atrial fib with mod rate control Filed Vitals:   07/28/13 1958 07/28/13 2040 07/29/13 0316 07/29/13 0509  BP:  114/57 110/48 154/70  Pulse: 150 108 125 105  Temp:    97.7 F (36.5 C)  TempSrc:    Oral  Resp:    18  Height:      Weight:    142 lb 6.7 oz (64.6 kg)  SpO2:  98%  98%    Intake/Output Summary (Last 24 hours) at 07/29/13 0837 Last data filed at 07/29/13 0600  Gross per 24 hour  Intake    530 ml  Output   1275 ml  Net   -745 ml    LABS: Basic Metabolic Panel:  Recent Labs  07/26/13 1615 07/27/13 2305 07/29/13 0418  NA 135* 137 136*  K 4.6 3.4* 4.4  CL 94* 99 99  CO2 19 25 23   GLUCOSE 268* 139* 153*  BUN 21 10 9   CREATININE 0.73 0.56 0.56  CALCIUM 9.2 8.4 8.7  MG  --  1.6  --    CBC:  Recent Labs  07/27/13 0345 07/29/13 0418  WBC 13.0* 12.0*  HGB 14.3 13.8  HCT 42.3 41.3  MCV 91.4 91.4  PLT 211 215   Cardiac Enzymes:  Recent Labs  07/27/13 2305  TROPONINI <0.30    Hemoglobin A1C:  Recent Labs  07/26/13 2039  HGBA1C 9.8*   BNP    Component Value Date/Time   PROBNP 1689.0* 07/26/2013 2036   Radiology/Studies:   CXR IMPRESSION:  Probable chronic lung disease. Hypoaeration without definite edema  or consolidation.  Electronically Signed  By: Jorje Guild M.D.  On: 07/27/2013 04:53   Physical Exam: Blood pressure 154/70, pulse 105, temperature 97.7 F (36.5 C), temperature source Oral, resp. rate 18, height 5' 4.96" (1.65 m), weight 142 lb 6.7 oz (64.6 kg), SpO2 98.00%. Weight change: -2 lb 6.8 oz (-1.1 kg)   IIRR and possible S3 Lying with no dyspnea  ASSESSMENT:  1. Presumed ischemic CM with chronic systolic HF, mildly decompensated 2. Atrial fibrillation, presumed chronic 3. Fractured femur 4. Dementia  Plan:  1. Add digoxin 2. Add LA nitrate 3. May need diuretic  therapy if dyspnea/hypoxia 4. If surgery, please try to match I and O 5. High LZJQ(>7%) for CV complications with CHF being most likely.  Demetrios Isaacs 07/29/2013, 8:37 AM

## 2013-07-29 NOTE — Progress Notes (Signed)
When write came onto shift, patient's HR was in a. Flutter at a rate in the 140's. NP was notified and new orders were given for IV lopressor PRN. RN gave dose of lopressor and patient's HR began to tread down. At 2045, patient converted back into NSR. Will continue to monitor patient.

## 2013-07-29 NOTE — Progress Notes (Signed)
Patient ID: Dennis Meadows, male   DOB: 12-03-1934, 79 y.o.   MRN: [id number] [facility] PROGRESS NOTE  Dennis Meadows [id number] DOB: 05-25-1935 DOA: 07/26/2013 PCP: Reymundo Poll, MD  Brief narrative: 79 year old male with history of CAD status post CABG, A. fib not on anticoagulation secondary to history of cerebral hemorrhage, moderate dementia, BPH, hypertension, depression, uncontrolled type 2 diabetes mellitus who was sent from skilled nursing facility [facility]) after sustaining a mechanical fall and landing on his right side. ED PA spoke with the nurse at skilled nursing facility and was informed that patient is normally wheelchair bound and that he tried to walk to the dining room and fell on his right side.. He had an x-ray of his right hip which showed fracture of his right femur and was sent to the ED.    Principal Problem:   Femur fracture, right - Secondary to mechanical fall - cardiology was consulted for clearance and pt deemed to be high risk for cardiovascular complications given severe systolic CHF with EF 0000000 and grade I diastolic dysfunction - pt also with atrial fibrillation, moderately controlled - cardiology has no other recommendations to offer to lower the cardiovascular risk at this time - continue supportive care, analgesia as needed  Active Problems:   HCAP - CXR on admission consistent with PNA - pt is on Vancomycin and Maxipime day #3/7  - plan on transitioning to oral ABX in AM    HTN (hypertension) - SBP in 100's, continue to monitor    Hypokalemia - supplemented and WNL this AM  - repeat BMP in AM   Atrial fibrillation - better rate controlled - continue Digoxin and metoprolol - not a candidate for anticoagulation given history of cerebral hemorrhage.    CAD (coronary artery disease) - continue Metoprolol    BPH (benign prostatic hyperplasia) - continue Cardura    Dementia - continue Remeron and Excelon patch    Type II or unspecified  type diabetes mellitus with peripheral circulatory disorders, uncontrolled - Hemoglobin A1C is 9.8, reasonable inpatient control  - continue Lantus 20 units at bedtime and SSI    Leucocytosis - secondary to HCAP, WBC trending down  - continue ABX as noted above and repeat CBC in AM   Malnutrition, severe - in pt with history of dysphagia - moderate aspiration risk - advance diet as pt able to tolerate   Code Status: DNR  Family Communication: No family at bedside  Disposition Plan: Skilled nursing facility   Consultants:  Orthopedics  Cardiology Procedures:  None Antibiotics:  Vancomycin 3/2 --> Cefepime 3/2 -->  HPI/Subjective: No events overnight.   Objective: Filed Vitals:   07/28/13 2040 07/29/13 0316 07/29/13 0509 07/29/13 1436  BP: 114/57 110/48 154/70 105/70  Pulse: 108 125 105 85  Temp:   97.7 F (36.5 C) 98.1 F (36.7 C)  TempSrc:   Oral Oral  Resp:   18 20  Height:      Weight:   64.6 kg (142 lb 6.7 oz)   SpO2: 98%  98% 100%    Intake/Output Summary (Last 24 hours) at 07/29/13 1702 Last data filed at 07/29/13 1437  Gross per 24 hour  Intake    900 ml  Output   1175 ml  Net   -275 ml    Exam:   General:  Pt is alert, follows commands appropriately, not in acute distress  Cardiovascular: Irregular rate and rhythm, S1/S2,  no rubs, no gallops  Respiratory: Diminished breath sounds at  bases, bibasilar crackles   Abdomen: Soft, non tender, non distended, bowel sounds present, no guarding  Extremities: trace bilateral LE pitting edema, pulses DP and PT palpable bilaterally  Data Reviewed: Basic Metabolic Panel:  Recent Labs Lab 07/26/13 1615 07/27/13 2305 07/29/13 0418  NA 135* 137 136*  K 4.6 3.4* 4.4  CL 94* 99 99  CO2 19 25 23   GLUCOSE 268* 139* 153*  BUN 21 10 9   CREATININE 0.73 0.56 0.56  CALCIUM 9.2 8.4 8.7  MG  --  1.6  --    Liver Function Tests:  Recent Labs Lab 07/26/13 1615  AST 17  ALT 25  ALKPHOS 64  BILITOT 0.8   PROT 7.2  ALBUMIN 3.4*   CBC:  Recent Labs Lab 07/26/13 1615 07/27/13 0345 07/29/13 0418  WBC 16.1* 13.0* 12.0*  HGB 15.8 14.3 13.8  HCT 46.5 42.3 41.3  MCV 91.4 91.4 91.4  PLT 209 211 215   Cardiac Enzymes:  Recent Labs Lab 07/27/13 2305  TROPONINI <0.30   CBG:  Recent Labs Lab 07/28/13 1711 07/28/13 2203 07/29/13 0729 07/29/13 1222 07/29/13 1646  GLUCAP 155* 125* 155* 225* 193*    Recent Results (from the past 240 hour(s))  MRSA PCR SCREENING     Status: None   Collection Time    07/26/13  9:24 PM      Result Value Ref Range Status   MRSA by PCR NEGATIVE  NEGATIVE Final   Comment:            The GeneXpert MRSA Assay (FDA     approved for NASAL specimens     only), is one component of a     comprehensive MRSA colonization     surveillance program. It is not     intended to diagnose MRSA     infection nor to guide or     monitor treatment for     MRSA infections.  CULTURE, BLOOD (ROUTINE X 2)     Status: None   Collection Time    07/26/13 10:50 PM      Result Value Ref Range Status   Specimen Description BLOOD LEFT ARM   Final   Special Requests BOTTLES DRAWN AEROBIC AND ANAEROBIC 5CC   Final   Culture  Setup Time     Final   Value: 07/27/2013 08:33     Performed at [facility]   Culture     Final   Value:        BLOOD CULTURE RECEIVED NO GROWTH TO DATE CULTURE WILL BE HELD FOR 5 DAYS BEFORE ISSUING A FINAL NEGATIVE REPORT     Performed at [facility]   Report Status PENDING   Incomplete  CULTURE, BLOOD (ROUTINE X 2)     Status: None   Collection Time    07/26/13 11:50 PM      Result Value Ref Range Status   Specimen Description BLOOD RIGHT ARM   Final   Special Requests BOTTLES DRAWN AEROBIC AND ANAEROBIC 8CC   Final   Culture  Setup Time     Final   Value: 07/27/2013 08:33     Performed at [facility]   Culture     Final   Value:        BLOOD CULTURE RECEIVED NO GROWTH TO DATE CULTURE WILL BE HELD FOR 5 DAYS  BEFORE ISSUING A FINAL NEGATIVE REPORT     Performed at [facility]   Report Status PENDING  Incomplete  MRSA PCR SCREENING     Status: Abnormal   Collection Time    07/27/13 11:03 AM      Result Value Ref Range Status   MRSA by PCR INVALID RESULTS, SPECIMEN SENT FOR CULTURE (*) NEGATIVE Final   Comment: RESULT CALLED TO, READ BACK BY AND VERIFIED WITH:     Abigail Butts Hinda Glatter [id number] @ [id number] BY J SCOTTON                The GeneXpert MRSA Assay (FDA     approved for NASAL specimens     only), is one component of a     comprehensive MRSA colonization     surveillance program. It is not     intended to diagnose MRSA     infection nor to guide or     monitor treatment for     MRSA infections.     DELTA CHECK NOTED  MRSA CULTURE     Status: None   Collection Time    07/27/13 11:03 AM      Result Value Ref Range Status   Specimen Description NOSE   Final   Special Requests NONE   Final   Culture     Final   Value: NO STAPHYLOCOCCUS AUREUS ISOLATED     Note: NOMRSA     Performed at [facility]   Report Status 07/29/2013 FINAL   Final     Scheduled Meds: . ceFEPime (MAXIPIME) IV  1 g Intravenous 3 times per day  . cloNIDine  0.3 mg Transdermal Weekly  . digoxin  0.125 mg Oral Daily  . divalproex  250 mg Oral BID  . doxazosin  2 mg Oral Daily  . enoxaparin  injection  40 mg Subcutaneous Q24H  . insulin aspart  0-15 Units Subcutaneous TID WC  . insulin glargine  20 Units Subcutaneous QHS  . lisinopril  2.5 mg Oral Daily  . LORazepam  0.5 mg Oral BID  . metoprolol tartrate  50 mg Oral BID  . mirtazapine  15 mg Oral QHS  . nitroGLYCERIN  0.2 mg Transdermal Daily  . rivastigmine  9.5 mg Transdermal Daily  . vancomycin  1,000 mg Intravenous Q12H   Continuous Infusions: . sodium chloride 10 mL/hr at 07/27/13 0457   Faye Ramsay, MD  [facility] Pager [phone]  If 7PM-7AM, please contact night-coverage [email or web address] Password [email or web address] 07/29/2013, 5:02 PM   LOS: 3  days

## 2013-07-30 ENCOUNTER — Encounter (HOSPITAL_COMMUNITY): Payer: Self-pay | Admitting: Certified Registered Nurse Anesthetist

## 2013-07-30 ENCOUNTER — Inpatient Hospital Stay (HOSPITAL_COMMUNITY): Payer: Medicare Other

## 2013-07-30 ENCOUNTER — Inpatient Hospital Stay (HOSPITAL_COMMUNITY): Payer: Medicare Other | Admitting: Certified Registered Nurse Anesthetist

## 2013-07-30 ENCOUNTER — Encounter (HOSPITAL_COMMUNITY): Payer: Medicare Other | Admitting: Certified Registered Nurse Anesthetist

## 2013-07-30 ENCOUNTER — Encounter (HOSPITAL_COMMUNITY)
Admission: EM | Disposition: A | Discharge status: Skilled Nursing Facility | Payer: Self-pay | Source: Home / Self Care | Attending: Internal Medicine

## 2013-07-30 DIAGNOSIS — I5022 Chronic systolic (congestive) heart failure: Secondary | ICD-10-CM

## 2013-07-30 HISTORY — PX: COMPRESSION HIP SCREW: SHX1386

## 2013-07-30 LAB — BASIC METABOLIC PANEL
BUN: 16 mg/dL (ref 6–23)
CO2: 23 mEq/L (ref 19–32)
Calcium: 8.5 mg/dL (ref 8.4–10.5)
Chloride: 97 mEq/L (ref 96–112)
Creatinine, Ser: 0.7 mg/dL (ref 0.50–1.35)
GFR calc non Af Amer: 89 mL/min — ABNORMAL LOW (ref >90)
Glucose, Bld: 203 mg/dL — ABNORMAL HIGH (ref 70–99)
POTASSIUM: 4 meq/L (ref 3.7–5.3)
Sodium: 133 mEq/L — ABNORMAL LOW (ref 137–147)

## 2013-07-30 LAB — CBC
HCT: 36.2 % — ABNORMAL LOW (ref 39.0–52.0)
HEMOGLOBIN: 12.3 g/dL — AB (ref 13.0–17.0)
MCH: 30.6 pg (ref 26.0–34.0)
MCHC: 34 g/dL (ref 30.0–36.0)
MCV: 90 fL (ref 78.0–100.0)
Platelets: 263 10*3/uL (ref 150–400)
RBC: 4.02 MIL/uL — AB (ref 4.22–5.81)
RDW: 13.5 % (ref 11.5–15.5)
WBC: 11.7 10*3/uL — ABNORMAL HIGH (ref 4.0–10.5)

## 2013-07-30 LAB — GLUCOSE, CAPILLARY
GLUCOSE-CAPILLARY: 175 mg/dL — AB (ref 70–99)
GLUCOSE-CAPILLARY: 171 mg/dL — AB (ref 70–99)
Glucose-Capillary: 253 mg/dL — ABNORMAL HIGH (ref 70–99)
Glucose-Capillary: 155 mg/dL — ABNORMAL HIGH (ref 70–99)

## 2013-07-30 SURGERY — COMPRESSION HIP
Anesthesia: General | Site: Hip | Laterality: Right

## 2013-07-30 MED ORDER — INSULIN ASPART 100 UNIT/ML ~~LOC~~ SOLN
4.0000 [IU] | Freq: Once | SUBCUTANEOUS | Status: AC
  Administered 2013-07-30: 4 [IU] via SUBCUTANEOUS

## 2013-07-30 MED ORDER — ETOMIDATE 2 MG/ML IV SOLN
INTRAVENOUS | Status: DC | PRN
  Administered 2013-07-30: 20 mg via INTRAVENOUS

## 2013-07-30 MED ORDER — PHENOL 1.4 % MT LIQD: 1.0000 | OROMUCOSAL | Status: DC | PRN

## 2013-07-30 MED ORDER — ACETAMINOPHEN 325 MG PO TABS
650.0000 mg | ORAL_TABLET | Freq: Four times a day (QID) | ORAL | Status: DC | PRN
  Administered 2013-07-31: 650 mg via ORAL
  Filled 2013-07-30: qty 2

## 2013-07-30 MED ORDER — MEPERIDINE HCL 25 MG/ML IJ SOLN: 6.2500 mg | INTRAMUSCULAR | Status: DC | PRN

## 2013-07-30 MED ORDER — LEVOFLOXACIN 750 MG PO TABS
750.0000 mg | ORAL_TABLET | Freq: Every day | ORAL | Status: DC
  Administered 2013-07-30 – 2013-07-31 (×2): 750 mg via ORAL
  Filled 2013-07-30 (×3): qty 1

## 2013-07-30 MED ORDER — NEOSTIGMINE METHYLSULFATE 1 MG/ML IJ SOLN
INTRAMUSCULAR | Status: DC | PRN
  Administered 2013-07-30: 3 mg via INTRAVENOUS

## 2013-07-30 MED ORDER — ONDANSETRON HCL 4 MG/2ML IJ SOLN
INTRAMUSCULAR | Status: DC | PRN
  Administered 2013-07-30: 4 mg via INTRAVENOUS

## 2013-07-30 MED ORDER — EPHEDRINE SULFATE 50 MG/ML IJ SOLN
INTRAMUSCULAR | Status: AC
  Filled 2013-07-30: qty 1

## 2013-07-30 MED ORDER — GLYCOPYRROLATE 0.2 MG/ML IJ SOLN
INTRAMUSCULAR | Status: AC
  Filled 2013-07-30: qty 2

## 2013-07-30 MED ORDER — MORPHINE SULFATE 2 MG/ML IJ SOLN
0.5000 mg | INTRAMUSCULAR | Status: DC | PRN
  Administered 2013-07-31: 0.5 mg via INTRAVENOUS
  Filled 2013-07-30: qty 1

## 2013-07-30 MED ORDER — ONDANSETRON HCL 4 MG/2ML IJ SOLN
INTRAMUSCULAR | Status: AC
  Filled 2013-07-30: qty 2

## 2013-07-30 MED ORDER — LACTATED RINGERS IV SOLN
INTRAVENOUS | Status: DC | PRN
  Administered 2013-07-30: 13:00:00 via INTRAVENOUS

## 2013-07-30 MED ORDER — PHENYLEPHRINE HCL 10 MG/ML IJ SOLN
10.0000 mg | INTRAVENOUS | Status: DC | PRN
  Administered 2013-07-30: 50 ug/min via INTRAVENOUS

## 2013-07-30 MED ORDER — MIDAZOLAM HCL 2 MG/2ML IJ SOLN
INTRAMUSCULAR | Status: AC
  Filled 2013-07-30: qty 2

## 2013-07-30 MED ORDER — FENTANYL CITRATE 0.05 MG/ML IJ SOLN
INTRAMUSCULAR | Status: DC | PRN
  Administered 2013-07-30 (×2): 25 ug via INTRAVENOUS
  Administered 2013-07-30: 50 ug via INTRAVENOUS
  Administered 2013-07-30 (×2): 25 ug via INTRAVENOUS

## 2013-07-30 MED ORDER — METOCLOPRAMIDE HCL 10 MG PO TABS: 5.0000 mg | ORAL_TABLET | Freq: Three times a day (TID) | ORAL | Status: DC | PRN

## 2013-07-30 MED ORDER — ACETAMINOPHEN 650 MG RE SUPP: 650.0000 mg | Freq: Four times a day (QID) | RECTAL | Status: DC | PRN

## 2013-07-30 MED ORDER — 0.9 % SODIUM CHLORIDE (POUR BTL) OPTIME
TOPICAL | Status: DC | PRN
  Administered 2013-07-30: 1000 mL

## 2013-07-30 MED ORDER — MENTHOL 3 MG MT LOZG: 1.0000 | LOZENGE | OROMUCOSAL | Status: DC | PRN

## 2013-07-30 MED ORDER — LACTATED RINGERS IV SOLN
INTRAVENOUS | Status: DC
  Administered 2013-07-30: 1000 mL via INTRAVENOUS

## 2013-07-30 MED ORDER — INSULIN ASPART 100 UNIT/ML ~~LOC~~ SOLN
SUBCUTANEOUS | Status: AC
  Filled 2013-07-30: qty 1

## 2013-07-30 MED ORDER — LIDOCAINE HCL (CARDIAC) 20 MG/ML IV SOLN
INTRAVENOUS | Status: AC
  Filled 2013-07-30: qty 5

## 2013-07-30 MED ORDER — ONDANSETRON HCL 4 MG/2ML IJ SOLN: 4.0000 mg | Freq: Four times a day (QID) | INTRAMUSCULAR | Status: DC | PRN

## 2013-07-30 MED ORDER — ONDANSETRON HCL 4 MG PO TABS: 4.0000 mg | ORAL_TABLET | Freq: Four times a day (QID) | ORAL | Status: DC | PRN

## 2013-07-30 MED ORDER — ALUM & MAG HYDROXIDE-SIMETH 200-200-20 MG/5ML PO SUSP: 30.0000 mL | ORAL | Status: DC | PRN

## 2013-07-30 MED ORDER — LIDOCAINE HCL (CARDIAC) 20 MG/ML IV SOLN
INTRAVENOUS | Status: DC | PRN
  Administered 2013-07-30: 60 mg via INTRAVENOUS

## 2013-07-30 MED ORDER — FENTANYL CITRATE 0.05 MG/ML IJ SOLN
INTRAMUSCULAR | Status: AC
Start: 2013-07-30 — End: 2013-07-30
  Filled 2013-07-30: qty 5

## 2013-07-30 MED ORDER — PROMETHAZINE HCL 25 MG/ML IJ SOLN: 6.2500 mg | INTRAMUSCULAR | Status: DC | PRN

## 2013-07-30 MED ORDER — FENTANYL CITRATE 0.05 MG/ML IJ SOLN: 25.0000 ug | INTRAMUSCULAR | Status: DC | PRN

## 2013-07-30 MED ORDER — GLUCERNA SHAKE PO LIQD
237.0000 mL | Freq: Three times a day (TID) | ORAL | Status: DC
  Administered 2013-07-31 (×2): 237 mL via ORAL
  Filled 2013-07-30 (×5): qty 237

## 2013-07-30 MED ORDER — SODIUM CHLORIDE 0.9 % IJ SOLN
INTRAMUSCULAR | Status: AC
  Filled 2013-07-30: qty 10

## 2013-07-30 MED ORDER — SUCCINYLCHOLINE CHLORIDE 20 MG/ML IJ SOLN
INTRAMUSCULAR | Status: DC | PRN
  Administered 2013-07-30: 100 mg via INTRAVENOUS

## 2013-07-30 MED ORDER — PHENYLEPHRINE 40 MCG/ML (10ML) SYRINGE FOR IV PUSH (FOR BLOOD PRESSURE SUPPORT)
PREFILLED_SYRINGE | INTRAVENOUS | Status: AC
  Filled 2013-07-30: qty 10

## 2013-07-30 MED ORDER — ENOXAPARIN SODIUM 40 MG/0.4ML ~~LOC~~ SOLN
40.0000 mg | SUBCUTANEOUS | Status: DC
  Administered 2013-07-31: 40 mg via SUBCUTANEOUS
  Filled 2013-07-30 (×2): qty 0.4

## 2013-07-30 MED ORDER — METOCLOPRAMIDE HCL 5 MG/ML IJ SOLN: 5.0000 mg | Freq: Three times a day (TID) | INTRAMUSCULAR | Status: DC | PRN

## 2013-07-30 MED ORDER — GLYCOPYRROLATE 0.2 MG/ML IJ SOLN
INTRAMUSCULAR | Status: DC | PRN
  Administered 2013-07-30: 0.4 mg via INTRAVENOUS

## 2013-07-30 MED ORDER — ETOMIDATE 2 MG/ML IV SOLN
INTRAVENOUS | Status: AC
  Filled 2013-07-30: qty 10

## 2013-07-30 MED ORDER — EPHEDRINE SULFATE 50 MG/ML IJ SOLN
INTRAMUSCULAR | Status: DC | PRN
  Administered 2013-07-30: 15 mg via INTRAVENOUS

## 2013-07-30 MED ORDER — PHENYLEPHRINE HCL 10 MG/ML IJ SOLN
INTRAMUSCULAR | Status: AC
  Filled 2013-07-30: qty 1

## 2013-07-30 MED ORDER — ROCURONIUM BROMIDE 100 MG/10ML IV SOLN
INTRAVENOUS | Status: DC | PRN
  Administered 2013-07-30: 30 mg via INTRAVENOUS

## 2013-07-30 SURGICAL SUPPLY — 40 items
BAG ZIPLOCK 12X15 (MISCELLANEOUS) ×3 IMPLANT
BIT DRILL 3.8MM (BIT) ×1
BIT DRILL TWIST 3.8X7 (BIT) ×2 IMPLANT
BNDG GAUZE ELAST 4 BULKY (GAUZE/BANDAGES/DRESSINGS) ×3 IMPLANT
CLEANER TIP ELECTROSURG 2X2 (MISCELLANEOUS) ×3 IMPLANT
DRAPE STERI IOBAN 125X83 (DRAPES) ×3 IMPLANT
DRSG MEPILEX BORDER 4X8 (GAUZE/BANDAGES/DRESSINGS) ×3 IMPLANT
ELECT REM PT RETURN 9FT ADLT (ELECTROSURGICAL) ×3
ELECTRODE REM PT RTRN 9FT ADLT (ELECTROSURGICAL) ×1 IMPLANT
EVACUATOR 1/8 PVC DRAIN (DRAIN) IMPLANT
FACESHIELD LNG OPTICON STERILE (SAFETY) ×6 IMPLANT
GAUZE XEROFORM 1X8 LF (GAUZE/BANDAGES/DRESSINGS) ×3 IMPLANT
GLOVE BIO SURGEON STRL SZ8 (GLOVE) ×3 IMPLANT
GLOVE BIOGEL PI IND STRL 8 (GLOVE) ×2 IMPLANT
GLOVE BIOGEL PI INDICATOR 8 (GLOVE) ×4
GOWN STRL REIN XL XLG (GOWN DISPOSABLE) ×9 IMPLANT
KIT BASIN OR (CUSTOM PROCEDURE TRAY) ×3 IMPLANT
NS IRRIG 1000ML POUR BTL (IV SOLUTION) ×3 IMPLANT
PACK GENERAL/GYN (CUSTOM PROCEDURE TRAY) ×3 IMPLANT
PAD ABD 8X10 STRL (GAUZE/BANDAGES/DRESSINGS) IMPLANT
PAD CAST 4YDX4 CTTN HI CHSV (CAST SUPPLIES) ×1 IMPLANT
PADDING CAST COTTON 4X4 STRL (CAST SUPPLIES) ×2
PENCIL BUTTON HOLSTER BLD 10FT (ELECTRODE) ×3 IMPLANT
PIN THREADED GUIDE ACE (PIN) ×6 IMPLANT
PLATE COMP 135D 4H STD (Plate) ×3 IMPLANT
POSITIONER SURGICAL ARM (MISCELLANEOUS) ×3 IMPLANT
SCREW ACECAP 42MM (Screw) ×6 IMPLANT
SCREW ACECAP 44MM (Screw) ×6 IMPLANT
SCREW LAG 85MM (Screw) ×2 IMPLANT
SCREW LAG 85X13XLRG KY (Screw) ×1 IMPLANT
STAPLER VISISTAT 35W (STAPLE) ×3 IMPLANT
SUT VIC AB 0 CT1 27 (SUTURE) ×2
SUT VIC AB 0 CT1 27XBRD ANTBC (SUTURE) ×1 IMPLANT
SUT VIC AB 1 CT1 27 (SUTURE) ×2
SUT VIC AB 1 CT1 27XBRD ANTBC (SUTURE) ×1 IMPLANT
SUT VIC AB 2-0 CT1 27 (SUTURE) ×2
SUT VIC AB 2-0 CT1 27XBRD (SUTURE) ×1 IMPLANT
TOWEL OR 17X26 10 PK STRL BLUE (TOWEL DISPOSABLE) ×3 IMPLANT
TOWEL OR NON WOVEN STRL DISP B (DISPOSABLE) ×3 IMPLANT
WATER STERILE IRR 1500ML POUR (IV SOLUTION) ×3 IMPLANT

## 2013-07-30 NOTE — H&P (View-Only) (Signed)
Melrose Nakayama, MD  Chauncey Cruel, PA-C  Loni Dolly, PA-C                                  [facility]                [address] [place], [place]  [address]   ORTHOPAEDIC CONSULTATION  Dennis Meadows            MRN:  [id number] DOB/SEX:  1934/11/29/male     CHIEF COMPLAINT:  Painful right leg  HISTORY: Dennis Meadows a 79 y.o. male with recent right leg pain and inability to stand.  [facility]) and apparently fell this morning.  Films at facility showed fracture and he was sent to [facility] via ambulance.  Also c/o some head pain.  ORS consulted for management of hip fracture.  Head CT relatively normal (nothing acute).  Baseline demented.  No family with him in ED.  Denies pains other than those listed above.   PAST MEDICAL HISTORY: Patient Active Problem List   Diagnosis Date Noted  . Dementia with behavioral disturbance 07/03/2013  . Pure hypercholesterolemia 05/16/2013  . Type II or unspecified type diabetes mellitus with peripheral circulatory disorders, uncontrolled(250.72) 05/16/2013  . Unspecified vitamin D deficiency 05/16/2013  . Pneumonia 03/31/2013  . Sepsis 03/30/2013  . Diabetes mellitus type 2, uncontrolled 03/30/2013  . Pre-syncope 09/05/2011  . Dysphagia 09/05/2011  . HTN (hypertension) 09/05/2011  . DM (diabetes mellitus) 09/05/2011  . Atrial fibrillation 09/05/2011  . CAD (coronary artery disease) 09/05/2011  . BPH (benign prostatic hyperplasia) 09/05/2011  . Dementia 09/05/2011  . TIA (transient ischemic attack) 09/05/2011   Past Medical History  Diagnosis Date  . Dementia   . DNR (do not resuscitate)   . Cerebral hemorrhage   . Hypertension   . Depression   . BPH (benign prostatic hypertrophy)   . Coronary artery disease   . Atrial fibrillation   . Vitamin B 12 deficiency   . C2 cervical fracture   . Former smoker   . Diabetes mellitus     Type 2  . History of congenital ichthyosis    Past Surgical History  Procedure  Laterality Date  . Coronary artery bypass graft       MEDICATIONS:  Current facility-administered medications:morphine 4 MG/ML injection 4 mg, 4 mg, Intravenous, Q1H PRN, Tanna Furry, MD Current outpatient prescriptions:acetaminophen (TYLENOL) 500 MG tablet, Take 500 mg by mouth every 4 (four) hours as needed. For pain, Disp: , Rfl: ;  antiseptic oral rinse (BIOTENE) LIQD, 15 mLs by Mouth Rinse route 2 times daily at 12 noon and 4 pm., Disp: , Rfl: ;  chlorhexidine (PERIDEX) 0.12 % solution, 15 mLs by Mouth Rinse route 2 (two) times daily., Disp: 120 mL, Rfl: 0 cloNIDine (CATAPRES - DOSED IN MG/24 HR) 0.3 mg/24hr patch, Place 0.3 mg onto the skin once a week. Every Friday( weekly), Disp: , Rfl: ;  divalproex (DEPAKOTE SPRINKLE) 125 MG capsule, Take 250 mg by mouth 2 (two) times daily. , Disp: , Rfl: ;  doxazosin (CARDURA) 2 MG tablet, Take 2 mg by mouth daily., Disp: , Rfl: ;  ergocalciferol (VITAMIN D2) 50000 UNITS capsule, Take 50,000 Units by mouth every 30 (thirty) days., Disp: , Rfl:  insulin glargine (LANTUS) 100 UNIT/ML injection, Inject 0.1 mLs (10 Units total) into the skin 2 (two) times daily., Disp: 10 mL, Rfl: 12;  insulin lispro (HUMALOG)  100 UNIT/ML injection, Inject into the skin 3 (three) times daily with meals. Sliding scale insulin: 201-250=2 units, 251-300=4 units, 301-350=6 units, 351-400= 8 units, > 400= Call MD, Disp: , Rfl:  loperamide (IMODIUM A-D) 2 MG tablet, Take 2 mg by mouth as needed. For diarrhea, Disp: , Rfl: ;  LORazepam (ATIVAN) 0.5 MG tablet, Take 0.5 mg by mouth 2 (two) times daily. , Disp: , Rfl: ;  magnesium hydroxide (MILK OF MAGNESIA) 400 MG/5ML suspension, Take 30 mLs by mouth at bedtime as needed. For constipation, Disp: , Rfl: ;  metoprolol tartrate (LOPRESSOR) 25 MG tablet, Take 1 tablet (25 mg total) by mouth 2 (two) times daily., Disp: , Rfl:  mirtazapine (REMERON) 15 MG tablet, Take 15 mg by mouth at bedtime., Disp: , Rfl: ;  rivastigmine (EXELON) 9.5 mg/24hr,  Place 1 patch (9.5 mg total) onto the skin daily., Disp: 30 patch, Rfl: 12  ALLERGIES:  No Known Allergies  REVIEW OF SYSTEMS: REVIEWED IN DETAIL IN CHART  FAMILY HISTORY:  No family history on file.  SOCIAL HISTORY:   History  Substance Use Topics  . Smoking status: Former Smoker    Types: Cigarettes  . Smokeless tobacco: Not on file  . Alcohol Use: No     EXAMINATION: Vital signs in last 24 hours: Temp:  [98.7 F (37.1 C)] 98.7 F (37.1 C) (03/01 1543) Pulse Rate:  [77-80] 79 (03/01 1849) Resp:  [20-34] 24 (03/01 1849) BP: (125-152)/(69-94) 152/69 mmHg (03/01 1847) SpO2:  [96 %-100 %] 100 % (03/01 1849)  BP 152/69  Pulse 79  Temp(Src) 98.7 F (37.1 C) (Oral)  Resp 24  SpO2 100%  General Appearance:    Alert to person but not place or year, cooperative, no distress, appears stated age  Head:    Normocephalic, without obvious abnormality, atraumatic  Eyes:    PERRL, conjunctiva/corneas clear, EOM's intact, fundi    benign, both eyes       Ears:    Normal TM's and external ear canals, both ears  Nose:   Nares normal, septum midline, mucosa normal, no drainage    or sinus tenderness  Throat:   Lips, mucosa, and tongue normal; teeth and gums normal  Neck:   Supple, symmetrical, trachea midline, no adenopathy;       thyroid:  No enlargement/tenderness/nodules; no carotid   bruit or JVD  Back:     Symmetric, mild kyphosis, ROM normal, no CVA tenderness  Lungs:     Clear to auscultation bilaterally, respirations unlabored  Chest wall:    No tenderness or deformity  Heart:    Irregular rate and rhythm, S1 and S2 normal, no murmur, rub   or gallop  Abdomen:     Soft, non-tender, bowel sounds active all four quadrants,    no masses, no organomegaly  Genitalia:    Did not examine     Extremities:   Extremities normal other than right leg, atraumatic, no cyanosis or edema  Pulses:   2+ and symmetric all extremities  Skin:   Skin color, texture, turgor normal, no rashes or  lesions  Lymph nodes:   Cervical, supraclavicular, and axillary nodes normal  Neurologic:   CNII-XII intact. Normal strength, sensation and reflexes      throughout    Musculoskeletal Exam  right leg SER and painful to ROM, skin OK.  Other three extremities with FROM   DIAGNOSTIC STUDIES: Recent laboratory studies:  Recent Labs  07/26/13 1615  WBC 16.1*  HGB 15.8  HCT 46.5  PLT 209    Recent Labs  07/26/13 1615  NA 135*  K 4.6  CL 94*  CO2 19  BUN 21  CREATININE 0.73  GLUCOSE 268*  CALCIUM 9.2   Lab Results  Component Value Date   INR 1.03 09/05/2011     Recent Radiographic Studies :  Dg Pelvis 1-2 Views  07/26/2013   CLINICAL DATA:  Fall, right hip pain  EXAM: PELVIS - 1-2 VIEW  COMPARISON:  None.  FINDINGS: Single frontal view of the pelvis submitted. Atherosclerotic calcifications bilateral femoral artery. There is mild displaced intertrochanteric fracture of proximal right femur. Mild displaced lesser trochanter bony fragment.  IMPRESSION: Mild displaced intertrochanteric fracture proximal right femur.   Electronically Signed   By: Lahoma Crocker M.D.   On: 07/26/2013 16:42   Dg Femur Right  07/26/2013   CLINICAL DATA:  Fall, right hip pain  EXAM: RIGHT FEMUR - 2 VIEW  COMPARISON:  None.  FINDINGS: Four views of the right femur submitted. Osteopenia is noted. Extensive atherosclerotic calcifications of femoral artery. There is displaced comminuted intertrochanteric fracture of proximal right femur.  IMPRESSION: Displaced intertrochanteric fracture of the proximal right femur.   Electronically Signed   By: Lahoma Crocker M.D.   On: 07/26/2013 16:41   Ct Head Wo Contrast  07/26/2013   CLINICAL DATA:  [place] this morning. Patient reports hitting head. Denies loss of consciousness.  EXAM: CT HEAD WITHOUT CONTRAST  CT CERVICAL SPINE WITHOUT CONTRAST  TECHNIQUE: Multidetector CT imaging of the head and cervical spine was performed following the standard protocol without intravenous  contrast. Multiplanar CT image reconstructions of the cervical spine were also generated.  COMPARISON:  03/30/2013 and 03/29/2013.  FINDINGS: CT HEAD FINDINGS  Ventricles are normal in configuration. There is ventricular and sulcal enlargement reflecting moderate atrophy. Patchy white matter hypoattenuation is noted most consistent with moderate to advanced chronic microvascular ischemic change no parenchymal masses or mass effect there is no evidence of a recent cortical infarct. An old infarct is noted along the inferior left cerebellum.  No extra-axial masses or abnormal fluid collections.  No intracranial hemorrhage.  Right mastoid air cells and most of the right middle ear cavity are opacified. Mild ethmoid sinus and anterior sphenoid sinus anti inferior frontal sinus mucosal thickening. Clear left mastoid air cells and middle ear cavity.  No skull fracture.  CT CERVICAL SPINE FINDINGS  No fracture. No spondylolisthesis. There are degenerative changes most notably of the facet joints. The bones are extensively demineralized. No convincing disc herniation. No significant central stenosis. There are varying degrees of neural foraminal narrowing.  Soft tissues are unremarkable.  As noted on the head CT, most of the right middle ear cavity is opacified as are most of the right mastoid air cells there is no convincing bone destruction.  Lung apices show mild peripheral scarring but are otherwise clear.  IMPRESSION: HEAD CT: No acute intracranial abnormality. Moderate atrophy and advanced chronic microvascular ischemic change. Old left cerebellar infarct.  Opacification of most of the right mastoid air cells as well as most of the right middle ear cavity without bone destruction. Some opacification was present previously, but this has increased. This warrants clinical correlation.  CERVICAL CT: No fracture or acute finding. No change from the prior study.   Electronically Signed   By: Lajean Manes M.D.   On:  07/26/2013 18:48   Ct Cervical Spine Wo Contrast  07/26/2013   CLINICAL DATA:  [place] this morning.  Patient reports hitting head. Denies loss of consciousness.  EXAM: CT HEAD WITHOUT CONTRAST  CT CERVICAL SPINE WITHOUT CONTRAST  TECHNIQUE: Multidetector CT imaging of the head and cervical spine was performed following the standard protocol without intravenous contrast. Multiplanar CT image reconstructions of the cervical spine were also generated.  COMPARISON:  03/30/2013 and 03/29/2013.  FINDINGS: CT HEAD FINDINGS  Ventricles are normal in configuration. There is ventricular and sulcal enlargement reflecting moderate atrophy. Patchy white matter hypoattenuation is noted most consistent with moderate to advanced chronic microvascular ischemic change no parenchymal masses or mass effect there is no evidence of a recent cortical infarct. An old infarct is noted along the inferior left cerebellum.  No extra-axial masses or abnormal fluid collections.  No intracranial hemorrhage.  Right mastoid air cells and most of the right middle ear cavity are opacified. Mild ethmoid sinus and anterior sphenoid sinus anti inferior frontal sinus mucosal thickening. Clear left mastoid air cells and middle ear cavity.  No skull fracture.  CT CERVICAL SPINE FINDINGS  No fracture. No spondylolisthesis. There are degenerative changes most notably of the facet joints. The bones are extensively demineralized. No convincing disc herniation. No significant central stenosis. There are varying degrees of neural foraminal narrowing.  Soft tissues are unremarkable.  As noted on the head CT, most of the right middle ear cavity is opacified as are most of the right mastoid air cells there is no convincing bone destruction.  Lung apices show mild peripheral scarring but are otherwise clear.  IMPRESSION: HEAD CT: No acute intracranial abnormality. Moderate atrophy and advanced chronic microvascular ischemic change. Old left cerebellar infarct.   Opacification of most of the right mastoid air cells as well as most of the right middle ear cavity without bone destruction. Some opacification was present previously, but this has increased. This warrants clinical correlation.  CERVICAL CT: No fracture or acute finding. No change from the prior study.   Electronically Signed   By: Lajean Manes M.D.   On: 07/26/2013 18:48    ASSESSMENT: right hip intertrochanteric fracture   PLAN:  Hip fracture needs to be stabilized to allow him to sit up and get OOB and hopefully stand/walk again.  Has some other medical issues which make his management a bit complicated and certainly appreciate medical team help.  Looks to have UTI on U/A with slightly elevated white count on CBC and diabetes control questionable with elevated glucose currently.  Has atrial fibrillation by history and exam but doesn't appear to be anticoagulated as far as I can tell.  I am ready to do his hip surgery whenever cleared by medical team.  Left a message with his son, Dennis Meadows, at [phone].  Shahrukh Pasch G 07/26/2013, 7:23 PM

## 2013-07-30 NOTE — Progress Notes (Signed)
SLP Cancellation Note  Patient Details Name: Dennis Meadows MRN: [id number] DOB: [birth year]   Cancelled treatment:       Reason Eval/Treat Not Completed: Medical issues which prohibited therapy (surgery)  Pt for surgery today, will follow up re: swallowing ability next date.    Luanna Salk, [place] [facility] SLP [phone]

## 2013-07-30 NOTE — Anesthesia Postprocedure Evaluation (Signed)
  Anesthesia Post-op Note  Patient: Dennis Meadows  Procedure(s) Performed: Procedure(s) (LRB): RIGHT COMPRESSION HIP SCREW (Right)  Patient Location: PACU  Anesthesia Type: General  Level of Consciousness: awake and alert   Airway and Oxygen Therapy: Patient Spontanous Breathing  Post-op Pain: mild  Post-op Assessment: Post-op Vital signs reviewed, Patient's Cardiovascular Status Stable, Respiratory Function Stable, Patent Airway and No signs of Nausea or vomiting  Last Vitals:  Filed Vitals:   07/30/13 2000  BP: 112/56  Pulse: 102  Temp: 36.8 C  Resp: 16    Post-op Vital Signs: stable   Complications: No apparent anesthesia complications

## 2013-07-30 NOTE — Transfer of Care (Signed)
Immediate Anesthesia Transfer of Care Note  Patient: Dennis Meadows  Procedure(s) Performed: Procedure(s): RIGHT COMPRESSION HIP SCREW (Right)  Patient Location: PACU  Anesthesia Type:General  Level of Consciousness: awake and patient cooperative  Airway & Oxygen Therapy: Patient Spontanous Breathing and Patient connected to face mask oxygen  Post-op Assessment: Report given to PACU RN, Post -op Vital signs reviewed and stable and Patient moving all extremities X 4  Post vital signs: stable  Complications: No apparent anesthesia complications

## 2013-07-30 NOTE — Brief Op Note (Signed)
Earsel Shouse [id number] 07/30/2013   PRE-OP DIAGNOSIS: right hip fracture  POST-OP DIAGNOSIS: same  PROCEDURE: right hip ORIF  ANESTHESIA: general  Dawnette Mione G   Dictation #:  [id number]

## 2013-07-30 NOTE — Progress Notes (Signed)
Patient ID: Dennis Meadows, male   DOB: [birth year], 79 y.o.   MRN: [id number]  [facility] PROGRESS NOTE  Dennis Meadows NIO:[id number] DOB: 28-Dec-1934 DOA: 07/26/2013 PCP: Florentina Jenny, MD  Brief narrative:  79 year old male with history of CAD status post CABG, A. fib not on anticoagulation secondary to history of cerebral hemorrhage, moderate dementia, BPH, hypertension, depression, uncontrolled type 2 diabetes mellitus who was sent from skilled nursing facility [facility]) after sustaining a mechanical fall and landing on his right side. ED PA spoke with the nurse at skilled nursing facility and was informed that patient is normally wheelchair bound and that he tried to walk to the dining room and fell on his right side.. He had an x-ray of his right hip which showed fracture of his right femur and was sent to the ED.   Principal Problem:  Femur fracture, right  - Secondary to mechanical fall  - cardiology was consulted for clearance and pt deemed to be high risk for cardiovascular complications given severe systolic CHF with EF 20-25% and grade I diastolic dysfunction  - pt also with atrial fibrillation, moderately controlled  - cardiology has no other recommendations to offer to lower the cardiovascular risk at this time  - plan for right hip ORIF today  Active Problems:  HCAP  - CXR on admission consistent with PNA  - pt is on Vancomycin and Maxipime day #4/7  - plan on transitioning to oral ABX Levaquin today  HTN (hypertension)  - SBP in 110's, continue to monitor  Hypokalemia  - supplemented and WNL this AM  - repeat BMP in AM  Atrial fibrillation  - better rate controlled  - continue Digoxin and metoprolol  - not a candidate for anticoagulation given history of cerebral hemorrhage.  CAD (coronary artery disease)  - continue Metoprolol  BPH (benign prostatic hyperplasia)  - continue Cardura  Dementia  - continue Remeron and Excelon patch  Type II or unspecified type  diabetes mellitus with peripheral circulatory disorders, uncontrolled  - Hemoglobin A1C is 9.8, reasonable inpatient control  - continue Lantus 20 units at bedtime and SSI  Leucocytosis  - secondary to HCAP, WBC trending down  - continue ABX as noted above and repeat CBC in AM  Malnutrition, severe  - in pt with history of dysphagia  - moderate aspiration risk  - advance diet as pt able to tolerate   Code Status: DNR  Family Communication: No family at bedside  Disposition Plan: Skilled nursing facility   Consultants:  Orthopedics  Cardiology Procedures:  ORIF 3/5 -->  Antibiotics:  Vancomycin 3/2 --> 3/5 Cefepime 3/2 --> 3/5 Levaquin 3/5 -->   HPI/Subjective: No events overnight.   Objective: Filed Vitals:   07/30/13 1600 07/30/13 1615 07/30/13 1630 07/30/13 1637  BP: 125/55 109/47 117/51   Pulse: 78 84 90 87  Temp:  97.7 F (36.5 C)  97.5 F (36.4 C)  TempSrc:      Resp: 19 18 19    Height:      Weight:      SpO2: 100% 95% 98% 100%    Intake/Output Summary (Last 24 hours) at 07/30/13 1640 Last data filed at 07/30/13 1637  Gross per 24 hour  Intake   1330 ml  Output    625 ml  Net    705 ml    Exam:   General:  Pt is alert, follows some commands appropriately, not in acute distress  Cardiovascular: Regular rate and rhythm, S1/S2, no murmurs,  no rubs, no gallops  Respiratory: Clear to auscultation bilaterally, no wheezing, diminished breath sounds at bases   Abdomen: Soft, non tender, non distended, bowel sounds present, no guarding  Extremities: No edema, pulses DP and PT palpable bilaterally, TTP in the right hip area   Data Reviewed: Basic Metabolic Panel:  Recent Labs Lab 07/26/13 1615 07/27/13 2305 07/29/13 0418 07/30/13 0345  NA 135* 137 136* 133*  K 4.6 3.4* 4.4 4.0  CL 94* 99 99 97  CO2 19 25 23 23   GLUCOSE 268* 139* 153* 203*  BUN 21 10 9 16   CREATININE 0.73 0.56 0.56 0.70  CALCIUM 9.2 8.4 8.7 8.5  MG  --  1.6  --   --     Liver Function Tests:  Recent Labs Lab 07/26/13 1615  AST 17  ALT 25  ALKPHOS 64  BILITOT 0.8  PROT 7.2  ALBUMIN 3.4*   CBC:  Recent Labs Lab 07/26/13 1615 07/27/13 0345 07/29/13 0418 07/30/13 0345  WBC 16.1* 13.0* 12.0* 11.7*  HGB 15.8 14.3 13.8 12.3*  HCT 46.5 42.3 41.3 36.2*  MCV 91.4 91.4 91.4 90.0  PLT 209 211 215 263   Cardiac Enzymes:  Recent Labs Lab 07/27/13 2305  TROPONINI <0.30   CBG:  Recent Labs Lab 07/29/13 1646 07/29/13 2257 07/30/13 0733 07/30/13 1136 07/30/13 1555  GLUCAP 193* 181* 175* 171* 253*    Recent Results (from the past 240 hour(s))  MRSA PCR SCREENING     Status: None   Collection Time    07/26/13  9:24 PM      Result Value Ref Range Status   MRSA by PCR NEGATIVE  NEGATIVE Final   Comment:            The GeneXpert MRSA Assay (FDA     approved for NASAL specimens     only), is one component of a     comprehensive MRSA colonization     surveillance program. It is not     intended to diagnose MRSA     infection nor to guide or     monitor treatment for     MRSA infections.  CULTURE, BLOOD (ROUTINE X 2)     Status: None   Collection Time    07/26/13 10:50 PM      Result Value Ref Range Status   Specimen Description BLOOD LEFT ARM   Final   Special Requests BOTTLES DRAWN AEROBIC AND ANAEROBIC 5CC   Final   Culture  Setup Time     Final   Value: 07/27/2013 08:33     Performed at [facility]   Culture     Final   Value:        BLOOD CULTURE RECEIVED NO GROWTH TO DATE CULTURE WILL BE HELD FOR 5 DAYS BEFORE ISSUING A FINAL NEGATIVE REPORT     Performed at [facility]   Report Status PENDING   Incomplete  CULTURE, BLOOD (ROUTINE X 2)     Status: None   Collection Time    07/26/13 11:50 PM      Result Value Ref Range Status   Specimen Description BLOOD RIGHT ARM   Final   Special Requests BOTTLES DRAWN AEROBIC AND ANAEROBIC 8CC   Final   Culture  Setup Time     Final   Value: 07/27/2013 08:33      Performed at [facility]   Culture     Final   Value:  BLOOD CULTURE RECEIVED NO GROWTH TO DATE CULTURE WILL BE HELD FOR 5 DAYS BEFORE ISSUING A FINAL NEGATIVE REPORT     Performed at [facility]   Report Status PENDING   Incomplete  MRSA PCR SCREENING     Status: Abnormal   Collection Time    07/27/13 11:03 AM      Result Value Ref Range Status   MRSA by PCR INVALID RESULTS, SPECIMEN SENT FOR CULTURE (*) NEGATIVE Final   Comment: RESULT CALLED TO, READ BACK BY AND VERIFIED WITH:     Abigail Butts Hinda Glatter [id number] @ [id number] BY J SCOTTON                The GeneXpert MRSA Assay (FDA     approved for NASAL specimens     only), is one component of a     comprehensive MRSA colonization     surveillance program. It is not     intended to diagnose MRSA     infection nor to guide or     monitor treatment for     MRSA infections.     DELTA CHECK NOTED  MRSA CULTURE     Status: None   Collection Time    07/27/13 11:03 AM      Result Value Ref Range Status   Specimen Description NOSE   Final   Special Requests NONE   Final   Culture     Final   Value: NO STAPHYLOCOCCUS AUREUS ISOLATED     Note: NOMRSA     Performed at [facility]   Report Status 07/29/2013 FINAL   Final     Scheduled Meds: . antiseptic oral rinse  15 mL Mouth Rinse q12n4p  . chlorhexidine  15 mL Mouth Rinse BID  . [START ON 07/31/2013] cloNIDine  0.3 mg Transdermal Weekly  . digoxin  0.125 mg Oral Daily  . divalproex  250 mg Oral BID  . doxazosin  2 mg Oral Daily  . enoxaparin (LOVENOX) injection  40 mg Subcutaneous Q24H  . feeding supplement (GLUCERNA SHAKE)  237 mL Oral TID WC  . insulin aspart      . insulin aspart  0-15 Units Subcutaneous TID WC  . insulin glargine  20 Units Subcutaneous QHS  . levofloxacin  750 mg Oral Daily  . lisinopril  2.5 mg Oral Daily  . LORazepam  0.5 mg Oral BID  . metoprolol tartrate  50 mg Oral BID  . mirtazapine  15 mg Oral QHS  . nitroGLYCERIN  0.2 mg  Transdermal Daily  . rivastigmine  9.5 mg Transdermal Daily  . sodium chloride  3 mL Intravenous Q12H   Continuous Infusions: . sodium chloride 10 mL/hr at 07/27/13 0457  . lactated ringers 1,000 mL (07/30/13 1554)   Faye Ramsay, MD  [facility] Pager [phone]  If 7PM-7AM, please contact night-coverage [email or web address] Password [email or web address] 07/30/2013, 4:40 PM   LOS: 4 days

## 2013-07-30 NOTE — Preoperative (Signed)
Beta Blockers   Reason not to administer Beta Blockers:Not Applicable, took BB 0/6/26 at 2203

## 2013-07-30 NOTE — Progress Notes (Addendum)
       Patient Name: Dennis Meadows Date of Encounter: 07/30/2013    SUBJECTIVE: The patient was seen on rounds this morning prior to orthopedic surgery.at the time of this note, he is now postop.iIt appears that surgery was performed without problems.  TELEMETRY:  Sinus rhythm and sinus Filed Vitals:   07/30/13 1615 07/30/13 1630 07/30/13 1637 07/30/13 1647  BP: 109/47 117/51  128/74  Pulse: 84 90 87 97  Temp: 97.7 F (36.5 C)  97.5 F (36.4 C) 97.8 F (36.6 C)  TempSrc:      Resp: 18 19  16   Height:      Weight:      SpO2: 95% 98% 100% 95%    Intake/Output Summary (Last 24 hours) at 07/30/13 1755 Last data filed at 07/30/13 1637  Gross per 24 hour  Intake   1330 ml  Output    625 ml  Net    705 ml    LABS: Basic Metabolic Panel:  Recent Labs  07/27/13 2305 07/29/13 0418 07/30/13 0345  NA 137 136* 133*  K 3.4* 4.4 4.0  CL 99 99 97  CO2 25 23 23   GLUCOSE 139* 153* 203*  BUN 10 9 16   CREATININE 0.56 0.56 0.70  CALCIUM 8.4 8.7 8.5  MG 1.6  --   --    CBC:  Recent Labs  07/29/13 0418 07/30/13 0345  WBC 12.0* 11.7*  HGB 13.8 12.3*  HCT 41.3 36.2*  MCV 91.4 90.0  PLT 215 263   Cardiac Enzymes:  Recent Labs  07/27/13 2305  TROPONINI <0.30     Radiology/Studies:  No new data  Physical Exam: Blood pressure 128/74, pulse 97, temperature 97.8 F (36.6 C), temperature source Oral, resp. rate 16, height 5' 4.96" (1.65 m), weight 143 lb 11.8 oz (65.2 kg), SpO2 95.00%. Weight change: 1 lb 5.2 oz (0.6 kg)   As noted above the vital signs are stable this afternoon and oxygenation is stable.  ASSESSMENT:  1. Chronic systolic heart failure, currently stable status post right hip ORIF 2. Proximal atrial fibrillation last evening. None this afternoon.  Plan:  1. Monitor for evidence of heart failure decompensation  2. Will repeat EKG  3. Match  Intake and output   4. We'll follow with you.  Demetrios Isaacs 07/30/2013, 5:55 PM

## 2013-07-30 NOTE — Interval H&P Note (Signed)
History and Physical Interval Note:  07/30/2013 2:10 PM  Dennis Meadows  has presented today for surgery, with the diagnosis of RIGHT HIP FRACTURE  The various methods of treatment have been discussed with the patient and family. After consideration of risks, benefits and other options for treatment, the patient has consented to  Procedure(s): RIGHT COMPRESSION HIP SCREW (Right) as a surgical intervention .  The patient's history has been reviewed, patient examined, no change in status, stable for surgery.  I have reviewed the patient's chart and labs.  Questions were answered to the patient's satisfaction.     Krissa Utke G

## 2013-07-30 NOTE — Progress Notes (Signed)
Last night at 2230, patient's HR was in the 160's and his BP dropped to 82/40. NP notified and new orders given for a 250cc bolus. BP was checked later at 2300, and it was 120/84. At 0001, BP was 100/55. HR went back down into the 110's. Will continue to monitor.

## 2013-07-30 NOTE — Anesthesia Preprocedure Evaluation (Addendum)
Anesthesia Evaluation  Patient identified by MRN, date of birth, ID band Patient awake and Patient confused    Reviewed: Allergy & Precautions, H&P , NPO status , Patient's Chart, lab work & pertinent test results  Airway       Dental   Pulmonary former smoker,          Cardiovascular hypertension, Pt. on medications + CAD, + CABG and + Peripheral Vascular Disease + dysrhythmias Atrial Fibrillation     Neuro/Psych Severe Dementia  negative psych ROS   GI/Hepatic negative GI ROS, Neg liver ROS,   Endo/Other  diabetes, Poorly Controlled, Type 2  Renal/GU negative Renal ROS  negative genitourinary   Musculoskeletal negative musculoskeletal ROS (+)   Abdominal   Peds negative pediatric ROS (+)  Hematology negative hematology ROS (+)   Anesthesia Other Findings   Reproductive/Obstetrics negative OB ROS                          Anesthesia Physical Anesthesia Plan  ASA: III  Anesthesia Plan: General   Post-op Pain Management:    Induction: Intravenous  Airway Management Planned: Oral ETT  Additional Equipment:   Intra-op Plan:   Post-operative Plan: Extubation in OR  Informed Consent: I have reviewed the patients History and Physical, chart, labs and discussed the procedure including the risks, benefits and alternatives for the proposed anesthesia with the patient or authorized representative who has indicated his/her understanding and acceptance.   Dental advisory given  Plan Discussed with: CRNA  Anesthesia Plan Comments:        Anesthesia Quick Evaluation

## 2013-07-31 ENCOUNTER — Encounter (HOSPITAL_COMMUNITY): Payer: Self-pay | Admitting: Orthopaedic Surgery

## 2013-07-31 LAB — BASIC METABOLIC PANEL
BUN: 14 mg/dL (ref 6–23)
CALCIUM: 8.5 mg/dL (ref 8.4–10.5)
CO2: 24 meq/L (ref 19–32)
CREATININE: 0.65 mg/dL (ref 0.50–1.35)
Chloride: 97 mEq/L (ref 96–112)
GFR calc non Af Amer: >90 mL/min (ref >90)
Glucose, Bld: 149 mg/dL — ABNORMAL HIGH (ref 70–99)
Potassium: 3.8 mEq/L (ref 3.7–5.3)
SODIUM: 134 meq/L — AB (ref 137–147)

## 2013-07-31 LAB — CBC
HCT: 33 % — ABNORMAL LOW (ref 39.0–52.0)
Hemoglobin: 11.2 g/dL — ABNORMAL LOW (ref 13.0–17.0)
MCH: 30.2 pg (ref 26.0–34.0)
MCHC: 33.9 g/dL (ref 30.0–36.0)
MCV: 88.9 fL (ref 78.0–100.0)
PLATELETS: 297 10*3/uL (ref 150–400)
RBC: 3.71 MIL/uL — AB (ref 4.22–5.81)
RDW: 13.5 % (ref 11.5–15.5)
WBC: 13.7 10*3/uL — AB (ref 4.0–10.5)

## 2013-07-31 LAB — GLUCOSE, CAPILLARY
Glucose-Capillary: 155 mg/dL — ABNORMAL HIGH (ref 70–99)
Glucose-Capillary: 174 mg/dL — ABNORMAL HIGH (ref 70–99)

## 2013-07-31 MED ORDER — TRAMADOL HCL 50 MG PO TABS: 50.0000 mg | ORAL_TABLET | Freq: Four times a day (QID) | ORAL | Status: AC | PRN

## 2013-07-31 MED ORDER — LISINOPRIL 5 MG PO TABS
5.0000 mg | ORAL_TABLET | Freq: Every day | ORAL | Status: DC
  Administered 2013-07-31: 5 mg via ORAL
  Filled 2013-07-31: qty 1

## 2013-07-31 MED ORDER — LORAZEPAM 0.5 MG PO TABS: 0.5000 mg | ORAL_TABLET | Freq: Two times a day (BID) | ORAL | Status: AC

## 2013-07-31 MED ORDER — LEVOFLOXACIN 750 MG PO TABS: 750.0000 mg | ORAL_TABLET | Freq: Every day | ORAL | Status: DC

## 2013-07-31 MED ORDER — LISINOPRIL 5 MG PO TABS
5.0000 mg | ORAL_TABLET | Freq: Every day | ORAL | Status: AC
Start: 2013-07-31 — End: ?

## 2013-07-31 MED ORDER — DIGOXIN 125 MCG PO TABS: 0.1250 mg | ORAL_TABLET | Freq: Every day | ORAL | Status: AC

## 2013-07-31 MED ORDER — ENOXAPARIN SODIUM 40 MG/0.4ML ~~LOC~~ SOLN: 40.0000 mg | SUBCUTANEOUS | Status: DC

## 2013-07-31 MED ORDER — ALBUTEROL SULFATE (2.5 MG/3ML) 0.083% IN NEBU: 2.5000 mg | INHALATION_SOLUTION | RESPIRATORY_TRACT | Status: AC | PRN

## 2013-07-31 NOTE — Progress Notes (Signed)
OT Cancellation Note  Patient Details Name: Elizardo Chilson MRN: [id number] DOB: 01/03/1935   Cancelled Treatment:    Reason Eval/Treat Not Completed: Other (comment).  Noted pt is from snf; will defer OT to that venue when he returns (if he has had a decline in function).  Tiffanyann Deroo 07/31/2013, 7:22 AM Lesle Chris, OTR/L [phone] 07/31/2013

## 2013-07-31 NOTE — Discharge Instructions (Signed)
Hip Fracture (Upper Femoral Fracture) °You have a hip fracture (break in bone). This is a fracture of the upper part of the big bone (femur, thigh bone) between your hip and knee. If your caregiver feels it is a stable fracture, occasionally it can be treated without surgery. Usually these fractures are unstable. This means that the bones will not heal properly without surgery. Surgery is necessary to hold the bones together in a good position where they will heal well. °DIAGNOSIS °A physical exam can determine if a fracture has occurred. X-ray studies are needed to see what type of fracture is present and to look for other injuries. These studies will help your caregiver determine what the best treatment is for you. If there is more than one option, your caregiver can give you the information needed to help you decide on the treatment. °TREATMENT  °The treatment for an unstable fracture is usually surgery. This means using a screw, nail, or rod to hold the bones in place.  °RISKS AND COMPLICATIONS °All surgery is associated with risks. Sometimes the implant may fail. Other complications of surgery include infection or the bones not healing properly. Sometimes the fracture may damage the blood supply to the head of the femur. That portion of bone may die (osteonecrosis or avascular necrosis). Sometimes to avoid this complication, an implant is used which just replaces the ball of the femur (hemi-arthroplasty or prosthetic replacement). Some of the other risks are: °· Excessive bleeding. °· Infection. °· Dislocation if a hemi-arthroplasty or a total hip was inserted. °· Failure to heal properly resulting in an unstable hip. °· Stiffness of hip following repair. °· On occasion, blood may have to be replaced before or during the procedure °LET YOUR CAREGIVERS KNOW ABOUT: °· Allergies. °· Medications taken including herbs, eye drops, over the counter medications, and creams. °· Use of steroids (by mouth or  creams). °· History of bleeding or blood problems. °· History of serious infection. °· Previous problems with anesthetics or novocaine. °· Possibility of pregnancy, if this applies. °· History of blood clots (thrombophlebitis). °· Previous surgery. °· Other health problems. °BEFORE THE PROCEDURE °Before surgery, an IV (intravenous line connected to your vein) may be started. You will be given an anesthetic (medications and gas to make you sleep) or given medications in your back to make you numb from the waist down (spinal anesthetic). °AFTER THE PROCEDURE °After surgery, you will be taken to the recovery area where a nurse will watch your progress. You may have a catheter (a long, narrow, hollow tube) in your bladder that helps you pass your water. Once you're awake, stable, and taking fluids well, you will be returned to your room. You will receive physical therapy and other care until you are doing well and your caregiver feels it is safe for you to be transferred either to home or to an extended care facility. Your activity level will change as your caregiver determines what is best for you. °· You may resume normal diet and activities as directed or allowed. °· Change dressings if necessary or as directed. °· Only take over-the-counter or prescription medicines for pain, discomfort, or fever as directed by your caregiver. °· You may be placed on blood thinners for 4-6 weeks to prevent blood clots. °SEEK IMMEDIATE MEDICAL CARE IF: °· There is swelling of your calf or leg. °· You have shortness of breath or chest pain. °· There is redness, swelling, or increasing pain in the wound. °· There is   pus coming from wound. °· You have an unexplained oral temperature above 102° F (38.9° C). °· There is a foul (bad) smell coming from the wound or dressing. °· There is a breaking open of the wound (edges not staying together) after sutures or staples have been removed. °· There is a marked increase in pain or shortening of  the leg. °· You have severe pain anywhere in the leg. °· There is any change in color or temperature of your leg below the injury. °MAKE SURE YOU:  °· Understand these instructions. °· Will watch your condition. °· Will get help right away if you are not doing well or get worse. °Document Released: 05/14/2005 Document Revised: 08/06/2011 Document Reviewed: 12/24/2012 °ExitCare® Patient Information ©2014 [employer]. ° °

## 2013-07-31 NOTE — Progress Notes (Signed)
Speech Language Pathology Treatment: Dysphagia  Patient Details Name: Dennis Meadows MRN: [id number] DOB: [birth year] Today's Date: 07/31/2013 Time: 5956-3875 SLP Time Calculation (min): 23 min  Assessment / Plan / Recommendation Clinical Impression  Pt had full liquid lunch tray at bedside.  Nursing assisting pt with feeding.  Pt continues to exhibit increased respiratory rate with po, and requires cues to swallow.  No overt s/s aspiration observed with limited po trials.  RN present to provide pain meds, and SLP encouraged continuing with full liquid diet.  Pt unable to vocalize - only a whisper was elicited. ST to continue to monitor diet tolerance and advance when pt appropriate to do so.   HPI HPI: 79 yo male adm to [facility] from [place] after falling while attempting to walk to dining room - femur fx.  Pt awaiting possible surgery, has complex medical /cardiac hx.  PMH + for dementia, HTN, BPH.  Pt with leukocytosis - but WBC trending down and possible HCAP.  CXR 3/2 negative for acute lung dx.  Head imaging 07/26/13 showed old cerebellar infarct, nothing acute.  BSE completed prior to surgery.   Pertinent Vitals VSS  SLP Plan  Continue with current plan of care    Recommendations Diet recommendations: Other(comment) (full liquid) Liquids provided via: Cup;Straw Medication Administration: Crushed with puree Supervision: Staff to assist with self feeding;Full supervision/cueing for compensatory strategies Compensations: Small sips/bites;Slow rate;Check for pocketing Postural Changes and/or Swallow Maneuvers: Upright 30-60 min after meal;Seated upright 90 degrees              Oral Care Recommendations: Oral care BID;Oral care before and after PO Follow up Recommendations: 24 hour supervision/assistance Plan: Continue with current plan of care    [name]. Quentin Ore [facility], CCC-SLP [phone]  Shonna Chock 07/31/2013, 12:22 PM

## 2013-07-31 NOTE — Progress Notes (Signed)
Subjective: 1 Day Post-Op Procedure(s) (LRB): RIGHT COMPRESSION HIP SCREW (Right) Patient will open eyes to name and look at you but no responses. No family in room this morning.  Activity level:  Touchdown weight bearing right leg Diet tolerance:  ok Voiding:  ok Patient reports pain as 0 on 0-10 scale. (non verbal, appears in no acute distress)    Objective: Vital signs in last 24 hours: Temp:  [97.2 F (36.2 C)-98.9 F (37.2 C)] 98.9 F (37.2 C) (03/06 0450) Pulse Rate:  [78-116] 106 (03/06 0450) Resp:  [16-20] 18 (03/06 0450) BP: (109-144)/(47-92) 114/62 mmHg (03/06 0450) SpO2:  [95 %-100 %] 100 % (03/06 0450) Weight:  [66.3 kg (146 lb 2.6 oz)] 66.3 kg (146 lb 2.6 oz) (03/06 0450)  Labs:  Recent Labs  07/29/13 0418 07/30/13 0345 07/31/13 0335  HGB 13.8 12.3* 11.2*    Recent Labs  07/30/13 0345 07/31/13 0335  WBC 11.7* 13.7*  RBC 4.02* 3.71*  HCT 36.2* 33.0*  PLT 263 297    Recent Labs  07/30/13 0345 07/31/13 0335  NA 133* 134*  K 4.0 3.8  CL 97 97  CO2 23 24  BUN 16 14  CREATININE 0.70 0.65  GLUCOSE 203* 149*  CALCIUM 8.5 8.5   No results found for this basename: LABPT, INR,  in the last 72 hours  Physical Exam:  Neurologically intact Neurovascular intact Sensation intact distally Incision: dressing C/D/I and no drainage No cellulitis present  Assessment/Plan:  1 Day Post-Op Procedure(s) (LRB): RIGHT COMPRESSION HIP SCREW (Right) Advance diet Up with therapy Discharge to SNF per medical team Follow up in office 2 weeks post op Continue Lovenox 40 mg QD for DVT prophylaxis  Reinforce dressing if needed.     Dennis Meadows, Larwance Sachs 07/31/2013, 9:15 AM

## 2013-07-31 NOTE — Progress Notes (Signed)
Patient is set to discharge back to [facility] today. Patient & facility aware. Discharge packet given to RN, Ene. PTAR scheduled for 2:00pm pickup (Service Request Id: [id number]).   Raynaldo Opitz, [facility] Clinical Social Worker cell #: [phone]

## 2013-07-31 NOTE — Progress Notes (Signed)
Pt D/C to [facility]. Report called to Caryl Pina, Therapist, sports at [facility]. Waiting for [facility] to pick up pt

## 2013-07-31 NOTE — Discharge Summary (Addendum)
Physician Discharge Summary  Dennis Meadows [id number] DOB: 1934-06-20 DOA: 07/26/2013  PCP: Reymundo Poll, MD  Admit date: 07/26/2013 Discharge date: 07/31/2013  Recommendations for Outpatient Follow-up:  1. Pt will need to follow up with PCP in 2-3 weeks post discharge 2. Please obtain BMP to evaluate electrolytes and kidney function 3. Please also check CBC to evaluate Hg and Hct levels 4. Please patient was discharged on Lovenox for DVT prophylaxis post operatively hip fracture repair 5. Please note that patient needs to see orthopedic specialist in 2 weeks post discharge Dr. Rhona Raider 6. Please also note that patient was discharged on Levaquin to complete therapy for hospital-acquired pneumonia, he is to take Levaquin for 5 more days post discharge 7. Cardiology has recommended additional lisinopril low-dose 5 mg by mouth daily, may need to increase the dose based on blood pressure control  Discharge Diagnoses: Hip fracture, status post hip repair, hospital-acquired pneumonia Principal Problem:   Femur fracture, right Active Problems:   HTN (hypertension)   Atrial fibrillation   CAD (coronary artery disease)   BPH (benign prostatic hyperplasia)   Dementia   Type II or unspecified type diabetes mellitus with peripheral circulatory disorders, uncontrolled(250.72)   Leucocytosis   HCAP (healthcare-associated pneumonia)   Malnutrition of moderate degree   Chronic systolic heart failure  Discharge Condition: Stable  Diet recommendation: Heart healthy diet and as patient able to tolerate  Brief narrative:  79 year old male with history of CAD status post CABG, A. fib not on anticoagulation secondary to history of cerebral hemorrhage, moderate dementia, BPH, hypertension, depression, uncontrolled type 2 diabetes mellitus who was sent from skilled nursing facility [facility]) after sustaining a mechanical fall and landing on his right side. ED PA spoke with the nurse at skilled nursing  facility and was informed that patient is normally wheelchair bound and that he tried to walk to the dining room and fell on his right side.. He had an x-ray of his right hip which showed fracture of his right femur and was sent to the ED.   Principal Problem:  Femur fracture, right  - Secondary to mechanical fall  - cardiology was consulted for clearance and pt deemed to be high risk for cardiovascular complications given severe systolic CHF with EF 0000000 and grade I diastolic dysfunction  - pt also with atrial fibrillation, moderately controlled  - cardiology has no other recommendations to offer to lower the cardiovascular risk at this time  - Patient is now status post ORIF post operative day one, clinically stable - Orthopedic team agrees with discharge skilled nursing facility today from their standpoint Active Problems:  HCAP  - CXR on admission consistent with PNA  - pt has completed course of Vancomycin and Maxipime for 4 days and was successfully transitioned to Levaquin by mouth 07/30/2013 - Continue Levaquin upon discharge for 5 more days HTN (hypertension)  - SBP in 110's, continue to monitor  Hypokalemia  - supplemented and WNL this AM  Atrial fibrillation  - better rate controlled  - continue Digoxin and metoprolol  - not a candidate for anticoagulation given history of cerebral hemorrhage.  Combined systolic and diastolic heart failure, stage I diastolic failure with EF of 25-30%  - Remains euvolemic on exam, stable while inpatient, weight on discharge 146 pounds  CAD (coronary artery disease)  - continue Metoprolol  BPH (benign prostatic hyperplasia)  - continue Cardura  Dementia  - continue Remeron and Excelon patch  Type II or unspecified type diabetes mellitus with peripheral  circulatory disorders, uncontrolled  - Hemoglobin A1C is 9.8, reasonable inpatient control  - continue Lantus upon discharge Leucocytosis  - secondary to HCAP, WBC trending down overall -  Slight increase in white blood cell count over the past 24 hours is likely reactive, postoperative Malnutrition, severe  - in pt with history of dysphagia  - moderate aspiration risk  - advance diet as pt able to tolerate  Functional quadriplegia - Secondary to progressive failure to thrive, deconditioning, new fracture, chronic medical problems outlined above - Discharge to skilled nursing facility for continuation of therapy  Code Status: DNR  Family Communication: No family at bedside, the family updated over the phone  Disposition Plan: Skilled nursing facility   Consultants:  Orthopedics  Cardiology Procedures:  ORIF 3/5 -->  Antibiotics:  Vancomycin 3/2 --> 3/5  Cefepime 3/2 --> 3/5  Levaquin 3/5 --> 5 more days post discharge  Discharge Exam: Filed Vitals:   07/31/13 1100  BP:   Pulse: 105  Temp:   Resp:    Filed Vitals:   07/31/13 0400 07/31/13 0450 07/31/13 1012 07/31/13 1100  BP:  114/62 120/63   Pulse:  106 106 105  Temp:  98.9 F (37.2 C) 98.7 F (37.1 C)   TempSrc:  Oral Oral   Resp: 16 18 18    Height:      Weight:  66.3 kg (146 lb 2.6 oz)    SpO2: 100% 100% 100%     General: Pt is alert, follows some commands appropriately, not in acute distress Cardiovascular: Regular rate and rhythm, S1/S2 +, no murmurs, no rubs, no gallops Respiratory: Clear to auscultation bilaterally, no wheezing, diminished breath sounds at bases  Abdominal: Soft, non tender, non distended, bowel sounds +, no guarding Extremities: no edema, no cyanosis, pulses palpable bilaterally DP and PT Neuro: Oriented to name only   Discharge Instructions  Discharge Orders   Future Orders Complete By Expires   Diet - low sodium heart healthy  As directed    Increase activity slowly  As directed        Medication List         acetaminophen 500 MG tablet  Commonly known as:  TYLENOL  Take 500 mg by mouth every 4 (four) hours as needed. For pain     albuterol (2.5 MG/3ML)  0.083% nebulizer solution  Commonly known as:  PROVENTIL  Inhale 3 mLs (2.5 mg total) into the lungs every 4 (four) hours as needed for wheezing or shortness of breath.     antiseptic oral rinse Liqd  15 mLs by Mouth Rinse route 2 times daily at 12 noon and 4 pm.     chlorhexidine 0.12 % solution  Commonly known as:  PERIDEX  15 mLs by Mouth Rinse route 2 (two) times daily.     cloNIDine 0.3 mg/24hr patch  Commonly known as:  CATAPRES - Dosed in mg/24 hr  Place 0.3 mg onto the skin once a week. Every Friday( weekly)     digoxin 0.125 MG tablet  Commonly known as:  LANOXIN  Take 1 tablet (0.125 mg total) by mouth daily.     divalproex 125 MG capsule  Commonly known as:  DEPAKOTE SPRINKLE  Take 250 mg by mouth 2 (two) times daily.     doxazosin 2 MG tablet  Commonly known as:  CARDURA  Take 2 mg by mouth daily.     enoxaparin 40 MG/0.4ML injection  Commonly known as:  LOVENOX  Inject 0.4 mLs (40  mg total) into the skin daily. For DVT prophylaxis     ergocalciferol 50000 UNITS capsule  Commonly known as:  VITAMIN D2  Take 50,000 Units by mouth every 30 (thirty) days.     insulin glargine 100 UNIT/ML injection  Commonly known as:  LANTUS  Inject 0.1 mLs (10 Units total) into the skin 2 (two) times daily.     insulin lispro 100 UNIT/ML injection  Commonly known as:  HUMALOG  Inject into the skin 3 (three) times daily with meals. Sliding scale insulin: 201-250=2 units, 251-300=4 units, 301-350=6 units, 351-400= 8 units, > 400= Call MD     levofloxacin 750 MG tablet  Commonly known as:  LEVAQUIN  Take 1 tablet (750 mg total) by mouth daily.     lisinopril 5 MG tablet  Commonly known as:  PRINIVIL,ZESTRIL  Take 1 tablet (5 mg total) by mouth daily.     loperamide 2 MG tablet  Commonly known as:  IMODIUM A-D  Take 2 mg by mouth as needed. For diarrhea     LORazepam 0.5 MG tablet  Commonly known as:  ATIVAN  Take 1 tablet (0.5 mg total) by mouth 2 (two) times daily.      magnesium hydroxide 400 MG/5ML suspension  Commonly known as:  MILK OF MAGNESIA  Take 30 mLs by mouth at bedtime as needed. For constipation     metoprolol tartrate 25 MG tablet  Commonly known as:  LOPRESSOR  Take 1 tablet (25 mg total) by mouth 2 (two) times daily.     mirtazapine 15 MG tablet  Commonly known as:  REMERON  Take 15 mg by mouth at bedtime.     rivastigmine 9.5 mg/24hr  Commonly known as:  EXELON  Place 1 patch (9.5 mg total) onto the skin daily.     traMADol 50 MG tablet  Commonly known as:  ULTRAM  Take 1 tablet (50 mg total) by mouth every 6 (six) hours as needed.           Follow-up Information   Schedule an appointment as soon as possible for a visit with Reymundo Poll, MD.   Specialty:  Family Medicine   Contact information:   [address] [place] [address] [phone]        The results of significant diagnostics from this hospitalization (including imaging, microbiology, ancillary and laboratory) are listed below for reference.     Microbiology: Recent Results (from the past 240 hour(s))  MRSA PCR SCREENING     Status: None   Collection Time    07/26/13  9:24 PM      Result Value Ref Range Status   MRSA by PCR NEGATIVE  NEGATIVE Final   Comment:            The GeneXpert MRSA Assay (FDA     approved for NASAL specimens     only), is one component of a     comprehensive MRSA colonization     surveillance program. It is not     intended to diagnose MRSA     infection nor to guide or     monitor treatment for     MRSA infections.  CULTURE, BLOOD (ROUTINE X 2)     Status: None   Collection Time    07/26/13 10:50 PM      Result Value Ref Range Status   Specimen Description BLOOD LEFT ARM   Final   Special Requests BOTTLES DRAWN AEROBIC AND ANAEROBIC  5CC   Final   Culture  Setup Time     Final   Value: 07/27/2013 08:33     Performed at [facility]   Culture     Final   Value:        BLOOD CULTURE  RECEIVED NO GROWTH TO DATE CULTURE WILL BE HELD FOR 5 DAYS BEFORE ISSUING A FINAL NEGATIVE REPORT     Performed at [facility]   Report Status PENDING   Incomplete  CULTURE, BLOOD (ROUTINE X 2)     Status: None   Collection Time    07/26/13 11:50 PM      Result Value Ref Range Status   Specimen Description BLOOD RIGHT ARM   Final   Special Requests BOTTLES DRAWN AEROBIC AND ANAEROBIC 8CC   Final   Culture  Setup Time     Final   Value: 07/27/2013 08:33     Performed at [facility]   Culture     Final   Value:        BLOOD CULTURE RECEIVED NO GROWTH TO DATE CULTURE WILL BE HELD FOR 5 DAYS BEFORE ISSUING A FINAL NEGATIVE REPORT     Performed at [facility]   Report Status PENDING   Incomplete  MRSA PCR SCREENING     Status: Abnormal   Collection Time    07/27/13 11:03 AM      Result Value Ref Range Status   MRSA by PCR INVALID RESULTS, SPECIMEN SENT FOR CULTURE (*) NEGATIVE Final   Comment: RESULT CALLED TO, READ BACK BY AND VERIFIED WITH:     Abigail Butts Hinda Glatter [id number] @ [id number] BY J SCOTTON                The GeneXpert MRSA Assay (FDA     approved for NASAL specimens     only), is one component of a     comprehensive MRSA colonization     surveillance program. It is not     intended to diagnose MRSA     infection nor to guide or     monitor treatment for     MRSA infections.     DELTA CHECK NOTED  MRSA CULTURE     Status: None   Collection Time    07/27/13 11:03 AM      Result Value Ref Range Status   Specimen Description NOSE   Final   Special Requests NONE   Final   Culture     Final   Value: NO STAPHYLOCOCCUS AUREUS ISOLATED     Note: NOMRSA     Performed at [facility]   Report Status 07/29/2013 FINAL   Final     Labs: Basic Metabolic Panel:  Recent Labs Lab 07/26/13 1615 07/27/13 2305 07/29/13 0418 07/30/13 0345 07/31/13 0335  NA 135* 137 136* 133* 134*  K 4.6 3.4* 4.4 4.0 3.8  CL 94* 99 99 97 97  CO2 19 25 23 23 24    GLUCOSE 268* 139* 153* 203* 149*  BUN 21 10 9 16 14   CREATININE 0.73 0.56 0.56 0.70 0.65  CALCIUM 9.2 8.4 8.7 8.5 8.5  MG  --  1.6  --   --   --    Liver Function Tests:  Recent Labs Lab 07/26/13 1615  AST 17  ALT 25  ALKPHOS 64  BILITOT 0.8  PROT 7.2  ALBUMIN 3.4*   CBC:  Recent Labs Lab 07/26/13 1615 07/27/13 0345 07/29/13 0418 07/30/13  0345 07/31/13 0335  WBC 16.1* 13.0* 12.0* 11.7* 13.7*  HGB 15.8 14.3 13.8 12.3* 11.2*  HCT 46.5 42.3 41.3 36.2* 33.0*  MCV 91.4 91.4 91.4 90.0 88.9  PLT 209 211 215 263 297   Cardiac Enzymes:  Recent Labs Lab 07/27/13 2305  TROPONINI <0.30   BNP: BNP (last 3 results)  Recent Labs  07/26/13 2036  PROBNP 1689.0*   CBG:  Recent Labs Lab 07/30/13 0733 07/30/13 1136 07/30/13 1555 07/30/13 2043 07/31/13 0755  GLUCAP 175* 171* 253* 155* 155*   SIGNED: Time coordinating discharge: Over 30 minutes  Faye Ramsay, MD  [facility] 07/31/2013, 11:30 AM Pager [phone]

## 2013-07-31 NOTE — Progress Notes (Signed)
       Patient Name: Dennis Meadows Date of Encounter: 07/31/2013    SUBJECTIVE:No complaints  TELEMETRY:  PAF, and NSR/SB intermittently Filed Vitals:   07/30/13 2349 07/31/13 0000 07/31/13 0400 07/31/13 0450  BP: 132/70   114/62  Pulse: 101   106  Temp: 98.3 F (36.8 C)   98.9 F (37.2 C)  TempSrc: Oral   Oral  Resp: 18 16 16 18   Height:      Weight:    146 lb 2.6 oz (66.3 kg)  SpO2: 100% 97% 100% 100%    Intake/Output Summary (Last 24 hours) at 07/31/13 0909 Last data filed at 07/31/13 0537  Gross per 24 hour  Intake   1120 ml  Output    825 ml  Net    295 ml    LABS: Basic Metabolic Panel:  Recent Labs  07/30/13 0345 07/31/13 0335  NA 133* 134*  K 4.0 3.8  CL 97 97  CO2 23 24  GLUCOSE 203* 149*  BUN 16 14  CREATININE 0.70 0.65  CALCIUM 8.5 8.5   CBC:  Recent Labs  07/30/13 0345 07/31/13 0335  WBC 11.7* 13.7*  HGB 12.3* 11.2*  HCT 36.2* 33.0*  MCV 90.0 88.9  PLT 263 297     Radiology/Studies:  n/a  Physical Exam: Blood pressure 114/62, pulse 106, temperature 98.9 F (37.2 C), temperature source Oral, resp. rate 18, height 5' 4.96" (1.65 m), weight 146 lb 2.6 oz (66.3 kg), SpO2 100.00%. Weight change: 2 lb 6.8 oz (1.1 kg)  Normal O2 stas Lying flat No edema   ASSESSMENT:  1. PAF, but not an anticoagulation candidate 2. Chronic systolic HF 3. Dementia  Plan:  Continue HF therapy. ACE could be further optimized, will increase to 5 mg daily Please call if we can help  Signed, Sinclair Grooms 07/31/2013, 9:09 AM

## 2013-07-31 NOTE — Op Note (Signed)
NAME:  RASHUN, GRATTAN NO.:  [id number]  MEDICAL RECORD NO.:  [id number]  LOCATION:  0347                         FACILITY:  [facility]  PHYSICIAN:  Monico Blitz. Hayly Litsey, M.D.DATE OF BIRTH:  [birth year]  DATE OF PROCEDURE:  07/30/2013 DATE OF DISCHARGE:                              OPERATIVE REPORT   PREOPERATIVE DIAGNOSIS:  Right hip intertrochanteric fracture.  POSTOPERATIVE DIAGNOSIS:  Right hip intertrochanteric fracture.  PROCEDURE:  Right hip open reduction and internal fixation.  ANESTHESIA:  General.  ATTENDING SURGEON:  Monico Blitz. Rhona Raider, M.D.  ASSISTANT:  Loni Dolly, PA.  INDICATION FOR PROCEDURE:  The patient is a 79 year old man who fell a few days ago at his place of residence and suffered a displaced intertrochanteric hip fracture.  He was admitted to the emergency room to the Medicine Service.  He had some medical comorbidities including cardiac and endocrine issues and was prepared for today's surgery in hopes of stabilizing his hip.  Informed operative consent was obtained, through his family after discussion of possible complications including reaction to anesthesia, infection, DVT, death.  SUMMARY OF FINDINGS AND PROCEDURE:  Under general anesthesia, through a lateral approach we stabilized an intertrochanteric hip fracture with a TK2 device by [employer].  This was a 85 mm hip screw with a 130 degree 4 hole side plate.  He had excellent bone quality.  Used fluoroscopy throughout the case to make appropriate intraoperative decisions and read all of these views myself.  Loni Dolly assisted throughout and was invaluable to the completion of the case in that he helped position and retract while I performed the procedure.  He also closed simultaneously to help minimize OR time.  DESCRIPTION OF PROCEDURE:  The patient was taken to the operating suite, where general anesthetic was applied without difficulty.  He was positioned on the fracture table  and prepped and draped in normal sterile fashion.  After administration of preop IV antibiotic an appropriate time-out, we took a lateral approach to the hip.  I made a lateral incision dissection down to the it band which was split longitudinally.  This then exposed the vastus lateralis, which was split down to the femur.  We then placed the pin up into the femoral head, seen to be central on 2 views.  I then over reamed to a depth of 80 mm followed by placement of a size 85 length TK2 to hip screw with excellent purchase.  Fluoroscopy confirmed adequate placement of the tip of the screw within a centimeter of the ideal spot.  This was checked on 2 planes.  We then fixed a 130 degree four-hole sideplate and achieved good bicortical purchase with all 4 screws.  Fluoroscopy again confirmed adequate placement of hardware and reduction of fracture which was nearly anatomic.  The wound was then thoroughly irrigated followed by reapproximation of vastus lateralis with a running suture of Vicryl.  IT band was reapproximated in a similar fashion.  Subcutaneous tissues reapproximated with 0 and 2-0 undyed Vicryl followed by skin closure with staples.  Adaptic was applied followed by dry gauze and tape.  ESTIMATED BLOOD LOSS AND FLUIDS:  Can be obtained from anesthesia records.  DISPOSITION:  The  patient was extubated in operating room and taken to recovery in stable addition.  He is to be admitted back to the Medicine Service for intensive management.     Monico Blitz Rhona Raider, M.D.     PGD/MEDQ  D:  07/30/2013  T:  07/31/2013  Job:  [id number]

## 2013-08-02 ENCOUNTER — Non-Acute Institutional Stay (SKILLED_NURSING_FACILITY): Payer: Medicare Other | Admitting: Internal Medicine

## 2013-08-02 DIAGNOSIS — R404 Transient alteration of awareness: Secondary | ICD-10-CM

## 2013-08-02 DIAGNOSIS — J189 Pneumonia, unspecified organism: Secondary | ICD-10-CM

## 2013-08-02 DIAGNOSIS — S7290XA Unspecified fracture of unspecified femur, initial encounter for closed fracture: Secondary | ICD-10-CM

## 2013-08-02 DIAGNOSIS — I4891 Unspecified atrial fibrillation: Secondary | ICD-10-CM

## 2013-08-02 DIAGNOSIS — S7291XA Unspecified fracture of right femur, initial encounter for closed fracture: Secondary | ICD-10-CM

## 2013-08-02 LAB — CULTURE, BLOOD (ROUTINE X 2)
CULTURE: NO GROWTH
Culture: NO GROWTH

## 2013-08-04 NOTE — ED Provider Notes (Signed)
Medical screening examination/treatment/procedure(s) were performed by non-physician practitioner and as supervising physician I was immediately available for consultation/collaboration.   EKG Interpretation   Date/Time:  Sunday July 26 2013 16:06:44 EST Ventricular Rate:  113 PR Interval:    QRS Duration: 179 QT Interval:  423 QTC Calculation: 580 R Axis:   133 Text Interpretation:  Atrial flutter Nonspecific intraventricular  conduction delay Borderline repolarization abnormality Confirmed by Jeneen Rinks   MD, [place] ([id number]) on 07/26/2013 4:31:40 PM        Tanna Furry, MD 08/04/13 [phone]

## 2013-08-04 NOTE — Progress Notes (Signed)
Patient ID: Dennis Meadows, male   DOB: [birth year], 79 y.o.   MRN: [id number]                   HISTORY & PHYSICAL  DATE:  08/02/2013    FACILITY: [facility]    LEVEL OF CARE:   SNF   CHIEF COMPLAINT:  Readmission to the facility, post stay at [facility], 07/26/2013 through 07/31/2013.     HISTORY OF PRESENT ILLNESS:  This is a 79 year-old man who has a history of dementia, atrial fibrillation, not on Coumadin secondary to cerebral bleeding and fall risk, was brought into the ER because he was found after having fallen.  He suffered a fracture of his right femur and was sent to the emergency room.  He saw Cardiology preoperatively due to having a history of CHF with an EF of 20-25%, atrial fibrillation, and grade 1 diastolic dysfunction.  He was sent to the operating room.    Chest x-ray on admission was consistent with pneumonia.  He was treated with vancomycin and Maxipime for five days, then was transitioned to Levaquin p.o.    PAST MEDICAL HISTORY/PROBLEM LIST:    Dementia.    Atrial fibrillation.  On no anticoagulation.    Type 2 diabetes.  On oral agents.    Healthcare-acquired pneumonia.    Hypertension.    History of intracerebral hemorrhage.    Hypokalemia.    History of coronary artery disease.    History of BPH.    PAST SURGICAL HISTORY:    ORIF, right femur fracture.    CURRENT MEDICATIONS:  Discharge medications are reviewed.    He is on:    Albuterol nebulizers every 4 hours as needed for wheezing.    Catapres 0.3/24 hours every week on Friday.    Digoxin 0.125 q.d.    Valproic acid 250 b.i.d.    Cardura 2 mg daily.    Lovenox 40 mg daily for DVT prophylaxis.    Vitamin D2, 50,000 U every 30 days.    Lantus insulin 10 U b.i.d.    Humalog sliding scale.    Levaquin 750 q.d.     Lisinopril 5 q.d.    Ativan 0.5 b.i.d.    Metoprolol 25 b.i.d.    Milk of Magnesia 30 mg daily.    Remeron 15 mg daily.    Exelon 1 patch daily.     Tramadol 50 mg q.6 p.r.n.    SOCIAL HISTORY:   CODE STATUS:  There are no advanced directives on the patient's chart.   HOUSING:  I believe he was in the Dementia Unit prior to the hip fracture.    REVIEW OF SYSTEMS:  Currently not possible.    PHYSICAL EXAMINATION:   VITAL SIGNS:   O2 SATURATIONS:  93% on room air.   PULSE:  97.   RESPIRATIONS:  18 and unlabored.    GENERAL APPEARANCE:  The patient is lethargic.  Does wake briefly to noxious stimuli, but then seems to fade off.   HEENT:   MOUTH/THROAT:   Oral exam is dry.   CHEST/RESPIRATORY:  Clear air entry bilaterally.   CARDIOVASCULAR:  CARDIAC:   Heart sounds are regular.  There are no murmurs.   GASTROINTESTINAL:  LIVER/SPLEEN/KIDNEYS:  No liver, no spleen.  No tenderness.   GENITOURINARY:  BLADDER:   Has a Foley catheter in place.  I am not completely certain of the history here.   MUSCULOSKELETAL:   EXTREMITIES:   RIGHT LOWER  EXTREMITY:  Right hip incision is clean.  There is no evidence of a DVT.   PSYCHIATRIC:   MENTAL STATUS:   Again, minimally arousable.    ASSESSMENT/PLAN:  Right femur fracture.  He has undergone an ORIF.    Question delirium.  He will need lab work including a comprehensive metabolic panel, CBC, valproic acid level, and dig level tomorrow.  I will check a CBG on him.    Hospital-acquired pneumonia.  He has completed treatment.  This does not appear to be unstable.    History of an ischemic cardiomyopathy.  No evidence currently of CHF, although his EF is noted at 20-25%.       Dementia.  On an Exelon patch.  He is also on valproic acid, I believe for behaviors.    Moderate protein calorie malnutrition.  On Remeron.    The patient does not appear to be that stable.  I am going to check his blood sugar now, lab work tomorrow.    If anything, he appears to be somewhat volume-contracted.    I am not exactly sure of the Foley catheter history here.    Lisinopril was added in hospital.     CPT CODE: 74944

## 2013-08-06 DIAGNOSIS — R404 Transient alteration of awareness: Secondary | ICD-10-CM | POA: Insufficient documentation

## 2013-08-11 ENCOUNTER — Non-Acute Institutional Stay (SKILLED_NURSING_FACILITY): Payer: Medicare Other | Admitting: Internal Medicine

## 2013-08-11 DIAGNOSIS — L89609 Pressure ulcer of unspecified heel, unspecified stage: Secondary | ICD-10-CM

## 2013-08-11 DIAGNOSIS — L03317 Cellulitis of buttock: Secondary | ICD-10-CM

## 2013-08-11 DIAGNOSIS — E860 Dehydration: Secondary | ICD-10-CM

## 2013-08-11 DIAGNOSIS — L0231 Cutaneous abscess of buttock: Secondary | ICD-10-CM

## 2013-08-11 DIAGNOSIS — L89309 Pressure ulcer of unspecified buttock, unspecified stage: Secondary | ICD-10-CM

## 2013-08-11 DIAGNOSIS — R404 Transient alteration of awareness: Secondary | ICD-10-CM

## 2013-08-11 NOTE — Progress Notes (Signed)
Patient ID: Dennis Meadows, male   DOB: [birth year], 79 y.o.   MRN: [id number]                   HISTORY & PHYSICAL  DATE:  08/11/2013    FACILITY: [facility]    LEVEL OF CARE:   SNF   CHIEF COMPLAINT:  Unstageable pressure ulcerations, continued altered LOC   History; this is a patient I admitted back to the facility on 3/9. He had been admitted for a right hip fracture. His postoperative course was complicated by perioperative pneumonia. When I admitted him back to the facility I queried her delirium. Lab work from 310 revealed a white count of 13.8 with 71% neutrophils his hemoglobin was 10.6 BUN 33 creatinine 0.7. His albumin 2.9. A repeat CBC on 3/12 showed his white count up to 14.8 again with 70% neutrophils. Do not have the valproic acid level and dig levels I ordered.  Since he has come back he has not been eating and drinking well. Nursing reports today eating of less than 25%. As well he is developed bilateral heel and a new left sacral wound which we have just reported yesterday   PAST MEDICAL HISTORY/PROBLEM LIST:   .     Recent repair of her right hip fracture complicated with hospital-acquired pneumonia  Dementia.    Atrial fibrillation.  On no anticoagulation.    Type 2 diabetes.  On oral agents.    Healthcare-acquired pneumonia.    Hypertension.    History of intracerebral hemorrhage.    Hypokalemia.    History of coronary artery disease with an ischemic cardiomyopathy with an EF of 20-25%  History of BPH.    PAST SURGICAL HISTORY:    ORIF, right femur fracture.    CURRENT MEDICATIONS:  Discharge medications are reviewed.    He is on:    Albuterol nebulizers every 4 hours as needed for wheezing.    Catapres 0.3/24 hours every week on Friday.    Digoxin 0.125 q.d.    Valproic acid 250 b.i.d.    Cardura 2 mg daily.    Vitamin D2, 50,000 U every 30 days.    Lantus insulin 10 U b.i.d.    Humalog sliding scale.     Lisinopril 5 q.d.     Ativan 0.5 b.i.d.    Metoprolol 25 b.i.d.    Milk of Magnesia 30 mg daily.    Remeron 15 mg daily.    Exelon 1 patch daily.    Tramadol 50 mg q.6 p.r.n.    SOCIAL HISTORY:   CODE STATUS:  There are no advanced directives on the patient's chart.  patient is apparently a ward of the state and has a DSS Education officer, museum who has legal guardianship HOUSING:  I believe he was in the Dementia Unit prior to the hip fracture.    REVIEW OF SYSTEMS:  Currently not possible.    PHYSICAL EXAMINATION:   VITAL SIGNS:   Temperature; 97.2 PULSE:  64   RESPIRATIONS:  18 and unlabored.    GENERAL APPEARANCE:   The patient is more awake than when I saw him last HEENT:   MOUTH/THROAT:   Oral exam is dry.   And probable oral thrush is noted CHEST/RESPIRATORY:  Clear air entry bilaterally.   CARDIOVASCULAR:  CARDIAC:   Heart sounds are regular.  There are no murmurs.   I would estimate 5% dehydration GASTROINTESTINAL:  LIVER/SPLEEN/KIDNEYS:  No liver, no spleen.  No tenderness.   GENITOURINARY:  BLADDER:   Has a Foley catheter in place.  I am not completely certain of the history here.   He has suprapubic tenderness but no CVA tenderness. MUSCULOSKELETAL:   EXTREMITIES:   RIGHT LOWER EXTREMITY:  Right hip incision is clean.  There is no evidence of a DVT.   Skin; he has a left greater than right unstageable wound on his heels with necrotic eschar on the left. More disturbing menisci he has an unstageable pressure area just near the gluteal fold on the left the. There is surrounding erythema and crepitus  ASSESSMENT/PLAN: #1 clinical dehydration #2 bilateral heel and left gluteal fold pressure ulcerations. I think there is underlying infection in the left buttock area with subcutaneous crepitus and erythema although I've seen more obvious examples. All of these wounds are unstageable #3 probable UTI #4 oral thrush. #5 moderate to severe protein calorie malnutrition  With the assistance of the  director of nursing at the facility we have for talk to the patient's legal guardian. He is not to have a feeding tube and not to be sent to the hospital. They would like a trial of IV fluids and IV antibiotics. I think this is reasonable for a short trial however if his oral intake does not pick up than even the IV fluids would become futile care  Extensive orders are written

## 2013-08-13 ENCOUNTER — Non-Acute Institutional Stay (SKILLED_NURSING_FACILITY): Payer: Medicare Other | Admitting: Internal Medicine

## 2013-08-13 ENCOUNTER — Encounter: Payer: Self-pay | Admitting: Internal Medicine

## 2013-08-13 DIAGNOSIS — L8995 Pressure ulcer of unspecified site, unstageable: Secondary | ICD-10-CM

## 2013-08-13 DIAGNOSIS — R627 Adult failure to thrive: Secondary | ICD-10-CM

## 2013-08-13 DIAGNOSIS — E43 Unspecified severe protein-calorie malnutrition: Secondary | ICD-10-CM | POA: Insufficient documentation

## 2013-08-13 DIAGNOSIS — E860 Dehydration: Secondary | ICD-10-CM

## 2013-08-13 DIAGNOSIS — L8990 Pressure ulcer of unspecified site, unspecified stage: Secondary | ICD-10-CM

## 2013-08-13 NOTE — Progress Notes (Signed)
Patient ID: Dennis Meadows, male   DOB: 06-Aug-1934, 79 y.o.   MRN: [id number]    [facility]  Code status- DNR  Chief Complaint  Patient presents with  . Acute Visit    decline in health condition   No Known Allergies  HPI 79 y/o male who is a ward of the state is here for long term care. He recently had a fall with right hip fracture and underwent ORIF. He also had pneumonia in hospital and was treated for it. He has been having overall decline in his oral intake and has multiple pressure ulcers. He was started on iv antibiotics and fluids recently and staff are concerned of his continued decline. Nursing staff concerned with his ongoing decline and would like for patient to be evaluated. Unable to obtain any history or review of system from patient. He has iv line in place. He appears in no acute distress   Review of system Unable to obtain  Past Medical History  Diagnosis Date  . Dementia   . DNR (do not resuscitate)   . Cerebral hemorrhage   . Hypertension   . Depression   . BPH (benign prostatic hypertrophy)   . Coronary artery disease   . Atrial fibrillation   . Vitamin B 12 deficiency   . C2 cervical fracture   . Former smoker   . Diabetes mellitus     Type 2  . History of congenital ichthyosis    Past Surgical History  Procedure Laterality Date  . Coronary artery bypass graft    . Compression hip screw Right 07/30/2013    Procedure: RIGHT COMPRESSION HIP SCREW;  Surgeon: Hessie Dibble, MD;  Location: [facility];  Service: Orthopedics;  Laterality: Right;    Medication reviewed. See [facility]  Physical exam BP 130/78  Pulse 80  Temp(Src) 96.5 F (35.8 C)  Resp 16  Ht 5\' 5"  (1.651 m)  Wt 146 lb (66.225 kg)  BMI 24.30 kg/m2  General- elderly male, ill appearing, minimally responsive to verbal commands, somnolent Head- atraumatic, normocephalic Eyes- PERRLA, EOMI, no pallor, no icterus Skin- pallor present, normal skin turgor, right hip incision  site healing well, left heel ulcer with eschar, unstageable pressure ulcer in gluteal fold area, iv line in place, easy bruising Mouth- moist mucus membrane, mouth breather Neck- no lymphadenopathy Cardiovascular- irregular heart rate,no murmurs/ rubs/ gallops Respiratory- bilateral clear to auscultation, no wheeze, no rhonchi, no crackles Abdomen- bowel sounds present, soft, non tender, foley in place draining clear urine Musculoskeletal- right hip incision healing well, not following commands at present, was out of bed to chair yesterday, at present in bed Neurological- unable to assess Psychiatry- unable to assess at present  Labs- 08/04/13 Wbc 13.8, hb 10.6, cr 0.7, bun 33 08/06/13 wbc 14.8 08/10/13 glu 228, bun 25, cr 0.66, na 142, k 4.7, ca 8.9  Assessment/plan  Pressure ulcers In both the heels and left gluteal fold. Currently on vancomycin and rocephin, vancomycin dose adjusted by pharmacy, remains afebrile but mental status has not changed much. Pressure ulcer prophylaxis and skin care to be continued. With poor po intake and his severe dementia, he has poor prognosis. Recheck cbc with diff  Protein calorie malnutrition Able to finish 25% of his tray. Needs assistance with feed and is aspiration risk. Aspiration precautions for now. Will stop iv fluids- has received 3 l iv fluid, appears clinically hydrated. Check bmp  Dehydration Improved, check renal function to assess further. Stop iv fluids  for now. Encourage po intake with aspiration precautions  Failure to thrive Persists and continues to decline. With his co-morbidities, dementia, non healing pressure ulcers and poor nutritional status, decline is to be anticipated. Comfort care is the main goal as per pt's legal guardian. Will get palliative care consult to assist with comfort care and possible hospice care

## 2013-08-14 ENCOUNTER — Non-Acute Institutional Stay (SKILLED_NURSING_FACILITY): Payer: Medicare Other | Admitting: Internal Medicine

## 2013-08-14 DIAGNOSIS — L89309 Pressure ulcer of unspecified buttock, unspecified stage: Secondary | ICD-10-CM

## 2013-08-14 DIAGNOSIS — E860 Dehydration: Secondary | ICD-10-CM

## 2013-08-14 DIAGNOSIS — L03317 Cellulitis of buttock: Secondary | ICD-10-CM

## 2013-08-14 DIAGNOSIS — L0231 Cutaneous abscess of buttock: Secondary | ICD-10-CM

## 2013-08-14 NOTE — Progress Notes (Signed)
Patient ID: Dennis Meadows, male   DOB: 11-10-1934, 79 y.o.   MRN: [id number]                    Progress Note  DATE:  08/14/2013    FACILITY: [facility]    LEVEL OF CARE:   SNF   CHIEF COMPLAINT:  F/u Unstageable pressure ulcerations, alteration of LOC  History; this is a patient I saw on 3/17. I recently readmitted into the facility after his stay at at [facility] with right hip fracture which was surgically repaired. His postoperative course was complicated by hospital-acquired pneumonia. When I saw him on 317 he was listless lethargic and dehydrated. We have given him and given him intravenous fluid. The major concerning issue on exam was a pressure ulcer on his left buttock which is unstageable and covered with a necrotic eschar. There was surrounding erythema and some crepitus. He was started on vancomycin and Rocephin for a deep tissue infection associated with this ulcer. He also has necrotic eschar over both of his heels, these are no doubt pressure areas. Finally he was eating and drinking very poorly less than 25% of his meals. Through the director of nursing we discussed aggressiveness of care with his legal guardian and agreed to an attempted fluids and antibiotics but no hospitalization and particularly no feeding tube.   His lab work from 3/18 came back with a white count of 12.8, platelet count of 734,000 hemoglobin 12 BUN of 29 creatinine of 0.81 sodium of 144 his albumin is 3.1. One out of 2 blood cultures is growing gram-positive cocci in clusters.  PAST MEDICAL HISTORY/PROBLEM LIST:   .     Recent repair of her right hip fracture complicated with hospital-acquired pneumonia  Dementia.    Atrial fibrillation.  On no anticoagulation.    Type 2 diabetes.  On oral agents.    Healthcare-acquired pneumonia.    Hypertension.    History of intracerebral hemorrhage.    Hypokalemia.    History of coronary artery disease with an ischemic cardiomyopathy with an EF of  20-25%  History of BPH.    PAST SURGICAL HISTORY:    ORIF, right femur fracture.    CURRENT MEDICATIONS:  Discharge medications are reviewed.    He is on:    Albuterol nebulizers every 4 hours as needed for wheezing.    Catapres 0.3/24 hours every week on Friday.    Digoxin 0.125 q.d.    Valproic acid 250 b.i.d.    Cardura 2 mg daily.    Vitamin D2, 50,000 U every 30 days.    Lantus insulin 10 U b.i.d.    Humalog sliding scale.     Lisinopril 5 q.d.    Ativan 0.5 b.i.d.    Metoprolol 25 b.i.d.    Milk of Magnesia 30 mg daily.    Remeron 15 mg daily.    Exelon 1 patch daily.    Tramadol 50 mg q.6 p.r.n.    SOCIAL HISTORY:   CODE STATUS:  There are no advanced directives on the patient's chart.  patient is apparently a ward of the state and has a DSS Education officer, museum who has legal guardianship HOUSING:  I believe he was in the Dementia Unit prior to the hip fracture.    REVIEW OF SYSTEMS:  Currently not possible.    PHYSICAL EXAMINATION:   VITAL SIGNS:   Temperature; 974 PULSE:  59 RESPIRATIONS:  18 and unlabored.    O2 sat is 95%  on room air GENERAL APPEARANCE:   The patient is more awake than when I saw him last HEENT:   MOUTH/THROAT:   Oral exam is dry.   And probable oral thrush is noted CHEST/RESPIRATORY:  Clear air entry bilaterally.   CARDIOVASCULAR:  CARDIAC:   Heart sounds are regular.  There are no murmurs.  He appears much better hydrated  GASTROINTESTINAL:  LIVER/SPLEEN/KIDNEYS:  No liver, no spleen.  No tenderness.   GENITOURINARY:  BLADDER:   Has a Foley catheter in place.  I am not completely certain of the history here.   He has suprapubic tenderness but no CVA tenderness. MUSCULOSKELETAL:   EXTREMITIES:   RIGHT LOWER EXTREMITY:  Right hip incision is clean.  There is no evidence of a DVT.   Skin; he has a left greater than right unstageable wound on his heels with necrotic eschar on the left. the left buttock wound has a blackened eschar  with continued expansion of the erythema. There is tenderness and crepitus   ASSESSMENT/PLAN: #1 clinical dehydration is improved  #2 bilateral heel and left gluteal fold pressure ulcerations. I think there is underlying infection in the left buttock area with subcutaneous crepitus and erythema although I've seen more obvious examples. All of these wounds are unstageable. There has been expansion of the erythema around the buttock wound and I am going to continue the vancomycin but add an alternative beta-lactam and that will add some anaerobic coverage in place of the Rocephin  #3 probable UTI #4 oral thrush. #5 moderate protein calorie malnutrition. Per the nurses staff at best he is eating 25% on a routine basis  I think the IV fluids and stop. I am going to add probably Zosyn to the vancomycin .  With the assistance of the director of nursing at the facility we have for talk to the patient's legal guardian. He is not to have a feeding tube and not to be sent to the hospital. They would like a trial of IV fluids and IV antibiotics. I think this is reasonable for a short trial however if his oral intake does not pick up than even the IV fluids would become futile care  Extensive orders are written

## 2013-08-21 ENCOUNTER — Non-Acute Institutional Stay (SKILLED_NURSING_FACILITY): Payer: Medicare Other | Admitting: Internal Medicine

## 2013-08-21 DIAGNOSIS — L03317 Cellulitis of buttock: Principal | ICD-10-CM

## 2013-08-21 DIAGNOSIS — R627 Adult failure to thrive: Secondary | ICD-10-CM

## 2013-08-21 DIAGNOSIS — L89309 Pressure ulcer of unspecified buttock, unspecified stage: Secondary | ICD-10-CM

## 2013-08-21 DIAGNOSIS — L0231 Cutaneous abscess of buttock: Secondary | ICD-10-CM

## 2013-08-21 NOTE — Progress Notes (Signed)
Patient ID: Dennis Meadows, male   DOB: [birth year], 79 y.o.   MRN: [id number]                    Progress Note  DATE:  08/21/2013    FACILITY: [facility]    LEVEL OF CARE:   SNF   CHIEF COMPLAINT:  F/u Unstageable pressure ulcerations, alteration of LOC  History; this is a patient I saw on 3/17. I recently readmitted into the facility after his stay at at [facility] with right hip fracture which was surgically repaired. His postoperative course was complicated by hospital-acquired pneumonia. When I saw him on 3/17 he was listless lethargic and dehydrated. We have given him and given him intravenous fluid. The major concerning issue on exam was a pressure ulcer on his left buttock which is unstageable and covered with a necrotic eschar. There was surrounding erythema and some crepitus. He was started on vancomycin and Rocephin for a deep tissue infection associated with this ulcer. He also has necrotic eschar over both of his heels, these are no doubt pressure areas. Finally he was eating and drinking very poorly less than 25% of his meals. Through the director of nursing we discussed aggressiveness of care with his legal guardian and agreed to an attempted fluids and antibiotics but no hospitalization and particularly no feeding tube.   I have given him a course of vancomycin and cefoxitin. He is more alert currently eating and drinking better  His lab work from 3/18 came back with a white count of 12.8, platelet count of 734,000 hemoglobin 12 BUN of 29 creatinine of 0.81 sodium of 144 his albumin is 3.1. One out of 2 blood cultures is growing gram-positive cocci in clusters.  PAST MEDICAL HISTORY/PROBLEM LIST:   .     Recent repair of her right hip fracture complicated with hospital-acquired pneumonia  Dementia.    Atrial fibrillation.  On no anticoagulation.    Type 2 diabetes.  On oral agents.    Healthcare-acquired pneumonia.    Hypertension.    History of intracerebral  hemorrhage.    Hypokalemia.    History of coronary artery disease with an ischemic cardiomyopathy with an EF of 20-25%  History of BPH.    PAST SURGICAL HISTORY:    ORIF, right femur fracture.    CURRENT MEDICATIONS:  Discharge medications are reviewed.    He is on:    Albuterol nebulizers every 4 hours as needed for wheezing.    Catapres 0.3/24 hours every week on Friday.    Digoxin 0.125 q.d.    Valproic acid 250 b.i.d.    Cardura 2 mg daily.    Vitamin D2, 50,000 U every 30 days.    Lantus insulin 10 U b.i.d.    Humalog sliding scale.     Lisinopril 5 q.d.    Ativan 0.5 b.i.d.    Metoprolol 25 b.i.d.    Milk of Magnesia 30 mg daily.    Remeron 15 mg daily.    Exelon 1 patch daily.    Tramadol 50 mg q.6 p.r.n.    SOCIAL HISTORY:   CODE STATUS:  There are no advanced directives on the patient's chart.  patient is apparently a ward of the state and has a DSS Education officer, museum who has legal guardianship HOUSING:  I believe he was in the Dementia Unit prior to the hip fracture.   On my last visit I verified no hospitalizations and no code  REVIEW OF SYSTEMS:  Currently not possible.    PHYSICAL EXAMINATION:   GENERAL APPEARANCE:   The patient is more awake than when I saw him last HEENT:   MOUTH/THROAT:   Oral exam is dry.   And probable oral thrush is noted CHEST/RESPIRATORY:  Clear air entry bilaterally.   CARDIOVASCULAR:  CARDIAC:   Heart sounds are regular.  There are no murmurs.  He appears much better hydrated  GASTROINTESTINAL:  LIVER/SPLEEN/KIDNEYS:  No liver, no spleen.  No tenderness.   GENITOURINARY:  BLADDER:   Has a Foley catheter in place.  I am not completely certain of the history here.   He has suprapubic tenderness but no CVA tenderness. MUSCULOSKELETAL:   EXTREMITIES:   RIGHT LOWER EXTREMITY:  Right hip incision is clean.  There is no evidence of a DVT.  Staples are still in Skin; he has a left greater than right unstageable wound on his  heels with necrotic eschar on the left. The left buttock wound has a blackened eschar with continued erythema however there is now no subcutaneous crepitus but still a large blackened eschar with no doubt underlying deep tissue injury is present  ASSESSMENT/PLAN: #1 clinical dehydration is improved  #2 left gluteal fold ulceration. I suspect the underlying infection here is better however he has been left with probable significant amount of necrotic tissue and very thick blackened eschar. Debridement of this area would be substantial and I think is what is beyond what I could do in the facility. I think the infection angle of this is improved [? Necrotizing fasciitis]. I am going to transition him to oral antibiotics once again I think this is a palliative situation that we're going to have to follow clinically. #3 probable UTI #4 oral thrush. #5 moderate protein calorie malnutrition. Per the nurses staff at best he is eating 25% on a routine basis  IV antibiotics have finished her period I'm going to continue the Foley catheter to protect the underlying area on his buttocks. We will continue his care in the facility as a palliative situation, i.e. largely comfort to intermediate care in the facility and palliative wound care. I am not opposed to involving hospice at this point especially if he deteriorates in the short to subacute timeframe.

## 2013-09-17 ENCOUNTER — Encounter: Payer: Self-pay | Admitting: Internal Medicine

## 2013-09-17 ENCOUNTER — Non-Acute Institutional Stay (SKILLED_NURSING_FACILITY): Payer: Medicare Other | Admitting: Internal Medicine

## 2013-09-17 DIAGNOSIS — E43 Unspecified severe protein-calorie malnutrition: Secondary | ICD-10-CM

## 2013-09-17 DIAGNOSIS — L8990 Pressure ulcer of unspecified site, unspecified stage: Secondary | ICD-10-CM

## 2013-09-17 DIAGNOSIS — R627 Adult failure to thrive: Secondary | ICD-10-CM

## 2013-09-17 DIAGNOSIS — E860 Dehydration: Secondary | ICD-10-CM

## 2013-09-17 DIAGNOSIS — F0391 Unspecified dementia with behavioral disturbance: Secondary | ICD-10-CM

## 2013-09-17 DIAGNOSIS — L8995 Pressure ulcer of unspecified site, unstageable: Secondary | ICD-10-CM

## 2013-09-17 DIAGNOSIS — F03918 Unspecified dementia, unspecified severity, with other behavioral disturbance: Secondary | ICD-10-CM

## 2013-09-17 DIAGNOSIS — E861 Hypovolemia: Secondary | ICD-10-CM | POA: Insufficient documentation

## 2013-09-17 NOTE — Progress Notes (Signed)
Patient ID: Dennis Meadows, male   DOB: [birth year], 78 y.o.   MRN: [id number]    [facility]  Chief Complaint  Patient presents with  . Acute Visit    poor po intake, combative, refusing medications   No Known Allergies  HPI 79 y/o male patient is here for long term care and seen today for acute visit. He is refusing medications and has been eating poorly - upto 10% of his meals. He has been having overall decline in his oral intake and has multiple pressure ulcers. He has completed antibioitc for uti recently. He is in his bed and in no acute distress but severely demented and unable to participate in history and ROS.   Review of system Unable to obtain He has lost 24 lbs over 4 weeks on review of his records.   Wt Readings from Last 3 Encounters:  09/17/13 122 lb (55.339 kg)  08/13/13 146 lb (66.225 kg)  07/31/13 146 lb 2.6 oz (66.3 kg)   Past Medical History  Diagnosis Date  . Dementia   . DNR (do not resuscitate)   . Cerebral hemorrhage   . Hypertension   . Depression   . BPH (benign prostatic hypertrophy)   . Coronary artery disease   . Atrial fibrillation   . Vitamin B 12 deficiency   . C2 cervical fracture   . Former smoker   . Diabetes mellitus     Type 2  . History of congenital ichthyosis    Past Surgical History  Procedure Laterality Date  . Coronary artery bypass graft    . Compression hip screw Right 07/30/2013    Procedure: RIGHT COMPRESSION HIP SCREW;  Surgeon: Hessie Dibble, MD;  Location: [facility];  Service: Orthopedics;  Laterality: Right;   Medication reviewed. See [facility]  Physical exam BP 132/70  Pulse 68  Temp(Src) 98.1 F (36.7 C)  Resp 18  Wt 122 lb (55.339 kg)  General- elderly frail male, ill appearing Head- atraumatic, normocephalic Eyes- PERRLA, EOMI, no pallor, no icterus Skin- pallor present, normal skin turgor, left heel ulcer with eschar, unstageable pressure ulcer in gluteal fold area Mouth- dry mucus membrane Neck- no  lymphadenopathy Cardiovascular- irregular heart rate,no murmurs/ rubs/ gallops Respiratory- bilateral clear to auscultation, no wheeze, no rhonchi, no crackles Abdomen- bowel sounds present, soft, non tender, foley in place draining dark concentrated urine Musculoskeletal- able to move all 4 extremities, tries to get out of bed constantly Neurological- unable to assess due to his dementia Psychiatry- unable to assess at present  Labs- 08/04/13 Wbc 13.8, hb 10.6, cr 0.7, bun 33 08/06/13 wbc 14.8 08/10/13 glu 228, bun 25, cr 0.66, na 142, k 4.7, ca 8.9  Assessment/plan  Severe Protein calorie malnutrition BMI around 20. Not taking his meals. Has pressure ulcers. Failed supplemental feeding. Will have him on d5 half NS @ 100 cc/hr for 2 litres to help with hydration. Given his poor prognosis with ongoing decline and FTT, will get hospice consult for comfort care. Will d/c remeron as I dont see any benefit to patient from this at present.   Failure to thrive With malnutrition, dementia and unhealing wounds. Will get hospice consult for comfort care.  Hypovolemia With dry mucus membrane, concentrate urine and poor po intake all pointing towards this, will start iv fluids for now for temporary relief measure but will need hospice to decide further on comfort care for him  Pressure ulcers In both the heels and left gluteal fold. Has foley in  place. Will stop doxazosin with pt refusing it and I dont see any additional benefit as he has foley in place.   Dementia with behavioral disturbance Continue his mood medication for now. Monitor clinically

## 2013-10-05 ENCOUNTER — Non-Acute Institutional Stay (SKILLED_NURSING_FACILITY): Payer: Medicare Other | Admitting: Internal Medicine

## 2013-10-05 DIAGNOSIS — F0391 Unspecified dementia with behavioral disturbance: Secondary | ICD-10-CM

## 2013-10-05 DIAGNOSIS — F03918 Unspecified dementia, unspecified severity, with other behavioral disturbance: Secondary | ICD-10-CM

## 2013-10-05 DIAGNOSIS — E119 Type 2 diabetes mellitus without complications: Secondary | ICD-10-CM

## 2013-10-05 DIAGNOSIS — R627 Adult failure to thrive: Secondary | ICD-10-CM

## 2013-10-05 DIAGNOSIS — I4891 Unspecified atrial fibrillation: Secondary | ICD-10-CM

## 2013-10-05 NOTE — Progress Notes (Signed)
                 PROGRESS NOTE  DATE: 10-05-13  FACILITY: Nursing Home Location: [facility]  LEVEL OF CARE: SNF (31)  Routine Visit  CHIEF COMPLAINT:  Manage FTT, dementia and atrial fibrillation  HISTORY OF PRESENT ILLNESS:  REASSESSMENT OF ONGOING PROBLEM(S):  DEMENTIA: The dementia is unstable. No complications noted from the medications presently being used. Advanced. Staff reports that patient is combative and refuses CBG checks and insulin. Patient is a poor historian.   FTT: The failure to thrive remains stable.  No complications reported from the medication(s) currently being used.  Last weight: 122 Lb. patient is hospice.  ATRIAL FIBRILLATION: the patients atrial fibrillation remains stable.  The staff deny DOE, tachycardia, orthopnea, transient neurological sx, pedal edema, palpitations, & PNDs.  No complications noted from the medications currently being used.  PAST MEDICAL HISTORY : Reviewed.  No changes.  CURRENT MEDICATIONS: Reviewed per [facility]  REVIEW OF SYSTEMS: Unobtainable due to dementia  PHYSICAL EXAMINATION  VS:  See vital signs section  GENERAL: no acute distress, normal body habitus EYES: Unable to assess NECK: supple, trachea midline, no neck masses, no thyroid tenderness, no thyromegaly LYMPHATICS: no LAN in the neck, no supraclavicular LAN RESPIRATORY: breathing is even & unlabored, BS CTAB CARDIAC: Heart rate is irregularly irregular, no murmur,no extra heart sounds, no edema GI: abdomen soft, normal BS, no masses, no tenderness, no hepatomegaly, no splenomegaly PSYCHIATRIC: the patient is alert & disoriented, affect & behavior appropriate  LABS/RADIOLOGY: 4-15 BUN 24, creatinine 0.84 3-15 glucose 125 otherwise BMP normal, WBC 11.8, hemoglobin 11.6, MCV 91, platelets 622, total protein 5.7, albumin 2.9, AST 54 otherwise liver profile normal 12-14 liver profile normal, hemoglobin 16.2 otherwise CBC normal, hemoglobin A1c 10.6,  glucose 170 otherwise BMP normal  ASSESSMENT/PLAN:  Failure to thrive-on hospice Dementia-advanced. Atrial fibrillation-rate controlled Diabetes mellitus-continue Lantus Hypertension-well controlled Anxiety-on Ativan.  CPT CODE: 77116  Erland Vivas Y. Durwin Reges, [place] [phone]

## 2013-10-07 ENCOUNTER — Non-Acute Institutional Stay (SKILLED_NURSING_FACILITY): Payer: Medicare Other | Admitting: Internal Medicine

## 2013-10-07 DIAGNOSIS — E1159 Type 2 diabetes mellitus with other circulatory complications: Secondary | ICD-10-CM

## 2013-10-09 NOTE — Progress Notes (Signed)
Patient ID: Dennis Meadows, male   DOB: 1935/03/07, 79 y.o.   MRN: [id number]            PROGRESS NOTE  DATE: 10/07/2013       FACILITY:  [facility]  LEVEL OF CARE: SNF (31)  Acute Visit  CHIEF COMPLAINT:  Manage diabetes mellitus.    HISTORY OF PRESENT ILLNESS: I was requested by the staff to assess the patient regarding above problem(s):  DM:pt's DM is unstable.  Staff denies polyuria, polydipsia, polyphagia, changes in vision or hypoglycemic episodes.  Patient is a poor historian.  No complications noted from the medication presently being used.  Staff report today CBG is 542 before lunch.   At other times, CBGs are running high in the 200-300 range.  Last hemoglobin A1c is:   Not available.  Patient is hospice.    PAST MEDICAL HISTORY : Reviewed.  No changes/see problem list  CURRENT MEDICATIONS: Reviewed per MAR/see medication list  REVIEW OF SYSTEMS:  Unobtainable due to dementia.    PHYSICAL EXAMINATION  VS:  T 96.6       P 64      RR 16      BP 121/50     POX 95%       GENERAL: no acute distress, normal body habitus NECK: supple, trachea midline, no neck masses, no thyroid tenderness, no thyromegaly RESPIRATORY: breathing is even & unlabored, BS CTAB CARDIAC: RRR, no murmur,no extra heart sounds, no edema GI: abdomen soft, normal BS, no masses, no tenderness, no hepatomegaly, no splenomegaly PSYCHIATRIC: the patient is minimally alert, decreased affect and mood      ASSESSMENT/PLAN:  Diabetes mellitus.  Uncontrolled problem.  Increase Lantus to 30 U q.h.s.    CPT CODE: 69485    Hospice patient   Merlene Laughter, MD [facility] [phone]

## 2013-10-26 ENCOUNTER — Non-Acute Institutional Stay (SKILLED_NURSING_FACILITY): Payer: Medicare Other | Admitting: Internal Medicine

## 2013-10-26 DIAGNOSIS — E1159 Type 2 diabetes mellitus with other circulatory complications: Secondary | ICD-10-CM

## 2013-10-26 DIAGNOSIS — F0391 Unspecified dementia with behavioral disturbance: Secondary | ICD-10-CM

## 2013-10-26 DIAGNOSIS — I4891 Unspecified atrial fibrillation: Secondary | ICD-10-CM

## 2013-10-26 DIAGNOSIS — R627 Adult failure to thrive: Secondary | ICD-10-CM

## 2013-10-26 DIAGNOSIS — F03918 Unspecified dementia, unspecified severity, with other behavioral disturbance: Secondary | ICD-10-CM

## 2013-10-26 NOTE — Progress Notes (Signed)
                  PROGRESS NOTE  DATE: 10-26-13  FACILITY: Nursing Home Location: [facility]  LEVEL OF CARE: SNF (31)  Routine Visit  CHIEF COMPLAINT:  Manage FTT, dementia and atrial fibrillation  HISTORY OF PRESENT ILLNESS:  REASSESSMENT OF ONGOING PROBLEM(S):  DEMENTIA: The dementia is unstable. No complications noted from the medications presently being used. Advanced. Staff reports that patient is combative and refuses CBG checks and insulin. Patient is a poor historian.   FTT: The failure to thrive remains stable.  No complications reported from the medication(s) currently being used.  Last weight: 122, 126 Lb. patient is hospice.  ATRIAL FIBRILLATION: the patients atrial fibrillation remains stable.  The staff deny DOE, tachycardia, orthopnea, transient neurological sx, pedal edema, palpitations, & PNDs.  No complications noted from the medications currently being used.  PAST MEDICAL HISTORY : Reviewed.  No changes.  CURRENT MEDICATIONS: Reviewed per [facility]  REVIEW OF SYSTEMS: Unobtainable due to dementia  PHYSICAL EXAMINATION  VS:  See vital signs section  GENERAL: no acute distress, normal body habitus NECK: supple, trachea midline, no neck masses, no thyroid tenderness, no thyromegaly RESPIRATORY: breathing is even & unlabored, BS CTAB CARDIAC: Heart rate is irregularly irregular, no murmur,no extra heart sounds, no edema GI: abdomen soft, normal BS, no masses, no tenderness, no hepatomegaly, no splenomegaly PSYCHIATRIC: the patient is alert & disoriented, affect & behavior appropriate  LABS/RADIOLOGY: 4-15 BUN 24, creatinine 0.84 3-15 glucose 125 otherwise BMP normal, WBC 11.8, hemoglobin 11.6, MCV 91, platelets 622, total protein 5.7, albumin 2.9, AST 54 otherwise liver profile normal 12-14 liver profile normal, hemoglobin 16.2 otherwise CBC normal, hemoglobin A1c 10.6, glucose 170 otherwise BMP normal  ASSESSMENT/PLAN:  Failure to thrive-on  hospice Dementia-advanced. Exelon patch was discontinued Atrial fibrillation-rate controlled Diabetes mellitus- Lantus was increased Hypertension-well controlled Anxiety-on Ativan. Will not do further labs due to hospice status  CPT CODE: 94709  Toney Difatta Y. Durwin Reges, [place] [phone]

## 2013-11-01 ENCOUNTER — Non-Acute Institutional Stay (SKILLED_NURSING_FACILITY): Payer: Medicare Other | Admitting: Internal Medicine

## 2013-11-01 DIAGNOSIS — I75019 Atheroembolism of unspecified upper extremity: Secondary | ICD-10-CM

## 2013-11-01 DIAGNOSIS — R233 Spontaneous ecchymoses: Secondary | ICD-10-CM

## 2013-11-01 DIAGNOSIS — I75013 Atheroembolism of bilateral upper extremities: Secondary | ICD-10-CM

## 2013-11-02 ENCOUNTER — Non-Acute Institutional Stay (SKILLED_NURSING_FACILITY): Payer: Medicare Other | Admitting: Internal Medicine

## 2013-11-02 DIAGNOSIS — D72829 Elevated white blood cell count, unspecified: Secondary | ICD-10-CM

## 2013-11-02 DIAGNOSIS — D638 Anemia in other chronic diseases classified elsewhere: Secondary | ICD-10-CM

## 2013-11-02 DIAGNOSIS — D473 Essential (hemorrhagic) thrombocythemia: Secondary | ICD-10-CM

## 2013-11-03 NOTE — Progress Notes (Addendum)
Patient ID: Dennis Meadows, male   DOB: 06-05-1934, 79 y.o.   MRN: [id number]                  PROGRESS NOTE  DATE:  10/30/2013    FAC[facility]: [facility]    LEVEL OF CARE:   SNF   Acute Visit   CHIEF COMPLAINT:  Petechial rash.    HISTORY OF PRESENT ILLNESS:  I was called 24 hours ago by Hospice to report a widespread petechial rash that I had seen earlier.  Unfortunately, I had not seen this previously, but I think one of my colleagues did.    He  apparently developed a rash two weeks ago, although this seemed to have faded somewhat.  He had multiple small petechial hemorrhages predominantly involving his back, arms and legs, especially distally.    As I was not particularly aware of this, I ordered lab work over the phone.  This is not particularly remarkable.  His white count is 13.3, platelet count 434.  Differential count is normal.  Comprehensive metabolic panel is normal except for an albumin of 2.8 and sedimentation rate is 59, slight elevation of his alk phos at 127.  Otherwise, everything else appears to be normal including his BUN and creatinine.        PHYSICAL EXAMINATION:   GENERAL APPEARANCE:  Very frail man who appears to be close to the end of life.   HEENT:   MOUTH/THROAT:   Dry mucous membranes with a coated tongue.   CHEST/RESPIRATORY:  Very poor air entry bilaterally.  He is tachypneic with a respiratory rate in the 30s.   GASTROINTESTINAL:  LIVER/SPLEEN/KIDNEYS:  No liver, no spleen palpable.   LYMPHATICS:  None palpable in the cervical, clavicular, or axillary areas.   SKIN:  INSPECTION:  In predominantly his left hand are multiple splinter hemorrhages over most of his fingers.  I am assuming that this was what was called to me two days ago.  He also has several areas on his legs and extensor surface of his knees.  Finally, he has two small open areas in the dorsal aspect of the right hand with some surrounding clearing, but erythema which almost looks fungal  but could be ischemic, as well.    ASSESSMENT/PLAN:  Widespread petechial rash in the areas as described.  I suspect this is an atheromatous emboli type picture.  His lab work is not really reflecting a cause.   The rash is fading; however, still quite evident in the left hand.  The area on the dorsal aspect of his right hand may very well be ischemic.  End-of-life issues.  This gentleman is tachypneic, does not look comfortable.  I am going to order Roxanol in the facility.  Some of his other medications, I just do not think really are required including his Catapres patch, ACE inhibitor.  I would like to keep him on his beta blocker.  He is on insulin and it would seem reasonable to keep him on something as long as this seems clinically feasible.

## 2013-11-06 DIAGNOSIS — D638 Anemia in other chronic diseases classified elsewhere: Secondary | ICD-10-CM | POA: Insufficient documentation

## 2013-11-06 DIAGNOSIS — D473 Essential (hemorrhagic) thrombocythemia: Secondary | ICD-10-CM | POA: Insufficient documentation

## 2013-11-06 NOTE — Progress Notes (Signed)
Patient ID: Dennis Meadows, male   DOB: [birth year], 79 y.o.   MRN: [id number]            PROGRESS NOTE  DATE: 11/02/2013        FACILITY:  [facility]  LEVEL OF CARE: SNF (31)  Acute Visit  CHIEF COMPLAINT:  Manage leukocytosis and anemia.     HISTORY OF PRESENT ILLNESS: I was requested by the staff to assess the patient regarding above problem(s):  LEUKOCYTOSIS:  New problem.  On 10/29/2013:  WBC 13.3, ANC 9.1.  In 07/2013:  WBC 11.8.  Patient does not follow commands due to advanced dementia.     ANEMIA: The anemia has been stable. The staff denies fatigue, melena or hematochezia. No complications from the medications currently being used.  On 10/29/2013:  Hemoglobin 12, MCV 87.  In 07/2013:  Hemoglobin 11.6.    PAST MEDICAL HISTORY : Reviewed.  No changes/see problem list  CURRENT MEDICATIONS: Reviewed per MAR/see medication list  REVIEW OF SYSTEMS:  Unobtainable due to dementia.    PHYSICAL EXAMINATION  VS:  T 97.3       P 80      RR 18      BP 124/68     POX 97%       WT (Lb) 126       GENERAL: no acute distress, normal body habitus NECK: supple, trachea midline, no neck masses, no thyroid tenderness, no thyromegaly RESPIRATORY: breathing is even & unlabored, BS CTAB CARDIAC: RRR, no murmur,no extra heart sounds, no edema GI: abdomen soft, normal BS, no masses, no tenderness, no hepatomegaly, no splenomegaly PSYCHIATRIC: the patient is minimally alert, disoriented, decreased affect and mood       LABS/RADIOLOGY: On 10/29/2013:  Platelet count 434.    In 07/2013:  Platelet count 622.    ASSESSMENT/PLAN:  Leukocytosis.  New problem.  Patient is hospice.  Therefore, we will not further intervene.    Anemia of chronic disease.  Hemoglobin improved.     Thrombocytosis.  Unstable problem.  Platelet count trending down.  Likely acute phase reactant.  Again, patient is hospice.     CPT CODE: 62229         Aundra Pung Y Faithanne Verret, [place] [phone]

## 2013-11-25 DEATH — deceased

## 2014-08-29 IMAGING — CT CT CERVICAL SPINE W/O CM
2 of 4 series · 5 of 14 positions shown, 6 images · non-contrast
Comparison: 03/30/2013 and 03/29/2013.

CLINICAL DATA: Fell this morning. Patient reports hitting head.
Denies loss of consciousness.

EXAM:
CT HEAD WITHOUT CONTRAST
CT CERVICAL SPINE WITHOUT CONTRAST
TECHNIQUE: Multidetector CT imaging of the head and cervical spine was
performed following the standard protocol without intravenous
contrast. Multiplanar CT image reconstructions of the cervical spine
were also generated.

[Series 3: c-spine st · axial · 0.32mm/px · z∈[-206,-146]mm · 2 of 92 slices shown, 3 images]
[im 31/92  soft-tissue]
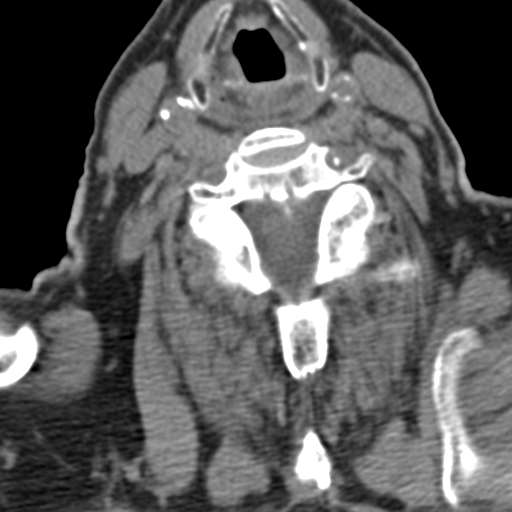
[im 31/92  bone]
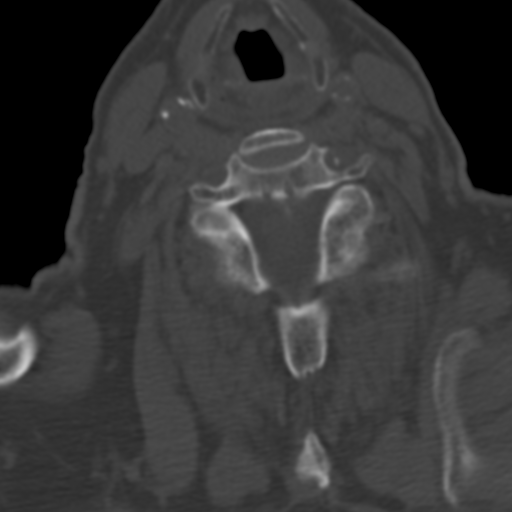
[im 61/92  bone]
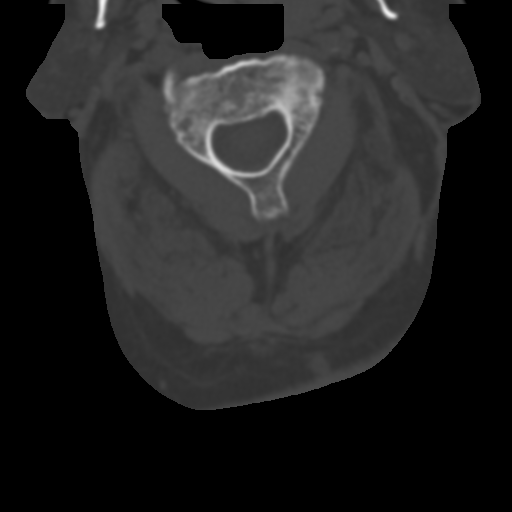

[Series 7: axial · axial · 0.23mm/px · z∈[-239,-140]mm · 3 of 101 slices shown]
[im 26/101  bone]
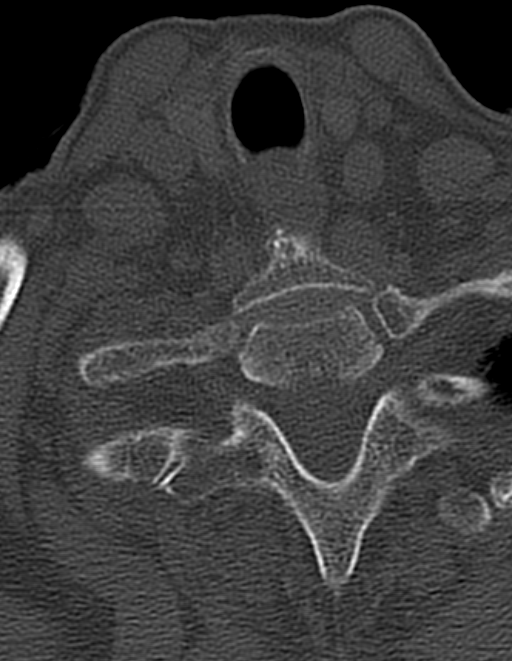
[im 51/101  bone]
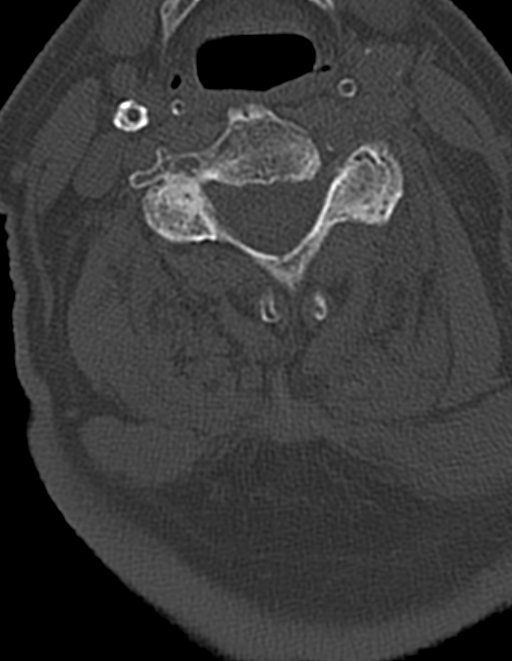
[im 76/101  bone]
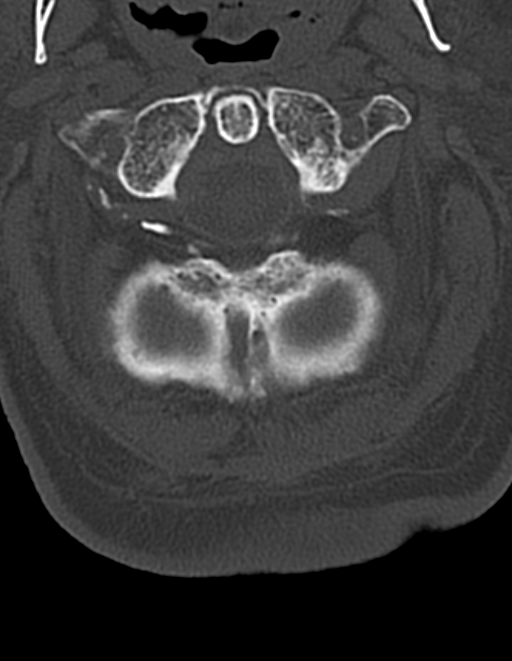

[5 of 14 positions shown; findings below may reference images not displayed]

FINDINGS: CT HEAD FINDINGS

Ventricles are normal in configuration. There is ventricular and
sulcal enlargement reflecting moderate atrophy. Patchy white matter
hypoattenuation is noted most consistent with moderate to advanced
chronic microvascular ischemic change no parenchymal masses or mass
effect there is no evidence of a recent cortical infarct. An old
infarct is noted along the inferior left cerebellum.

No extra-axial masses or abnormal fluid collections.

No intracranial hemorrhage.

Right mastoid air cells and most of the right middle ear cavity are
opacified. Mild ethmoid sinus and anterior sphenoid sinus anti
inferior frontal sinus mucosal thickening. Clear left mastoid air
cells and middle ear cavity.

No skull fracture.

CT CERVICAL SPINE FINDINGS

No fracture. No spondylolisthesis. There are degenerative changes
most notably of the facet joints. The bones are extensively
demineralized. No convincing disc herniation. No significant central
stenosis. There are varying degrees of neural foraminal narrowing.

Soft tissues are unremarkable.

As noted on the head CT, most of the right middle ear cavity is
opacified as are most of the right mastoid air cells there is no
convincing bone destruction.

Lung apices show mild peripheral scarring but are otherwise clear.
IMPRESSION: HEAD CT: No acute intracranial abnormality. Moderate atrophy and
advanced chronic microvascular ischemic change. Old left cerebellar
infarct.

Opacification of most of the right mastoid air cells as well as most
of the right middle ear cavity without bone destruction. Some
opacification was present previously, but this has increased. This
warrants clinical correlation.

CERVICAL CT: No fracture or acute finding. No change from the prior
study.

## 2014-08-30 IMAGING — CR DG CHEST 1V PORT
1 series · 1 of 1 positions shown · non-contrast
Comparison: Chest x-ray from yesterday

CLINICAL DATA: Hypoxia

EXAM:
PORTABLE CHEST - 1 VIEW

[AP]
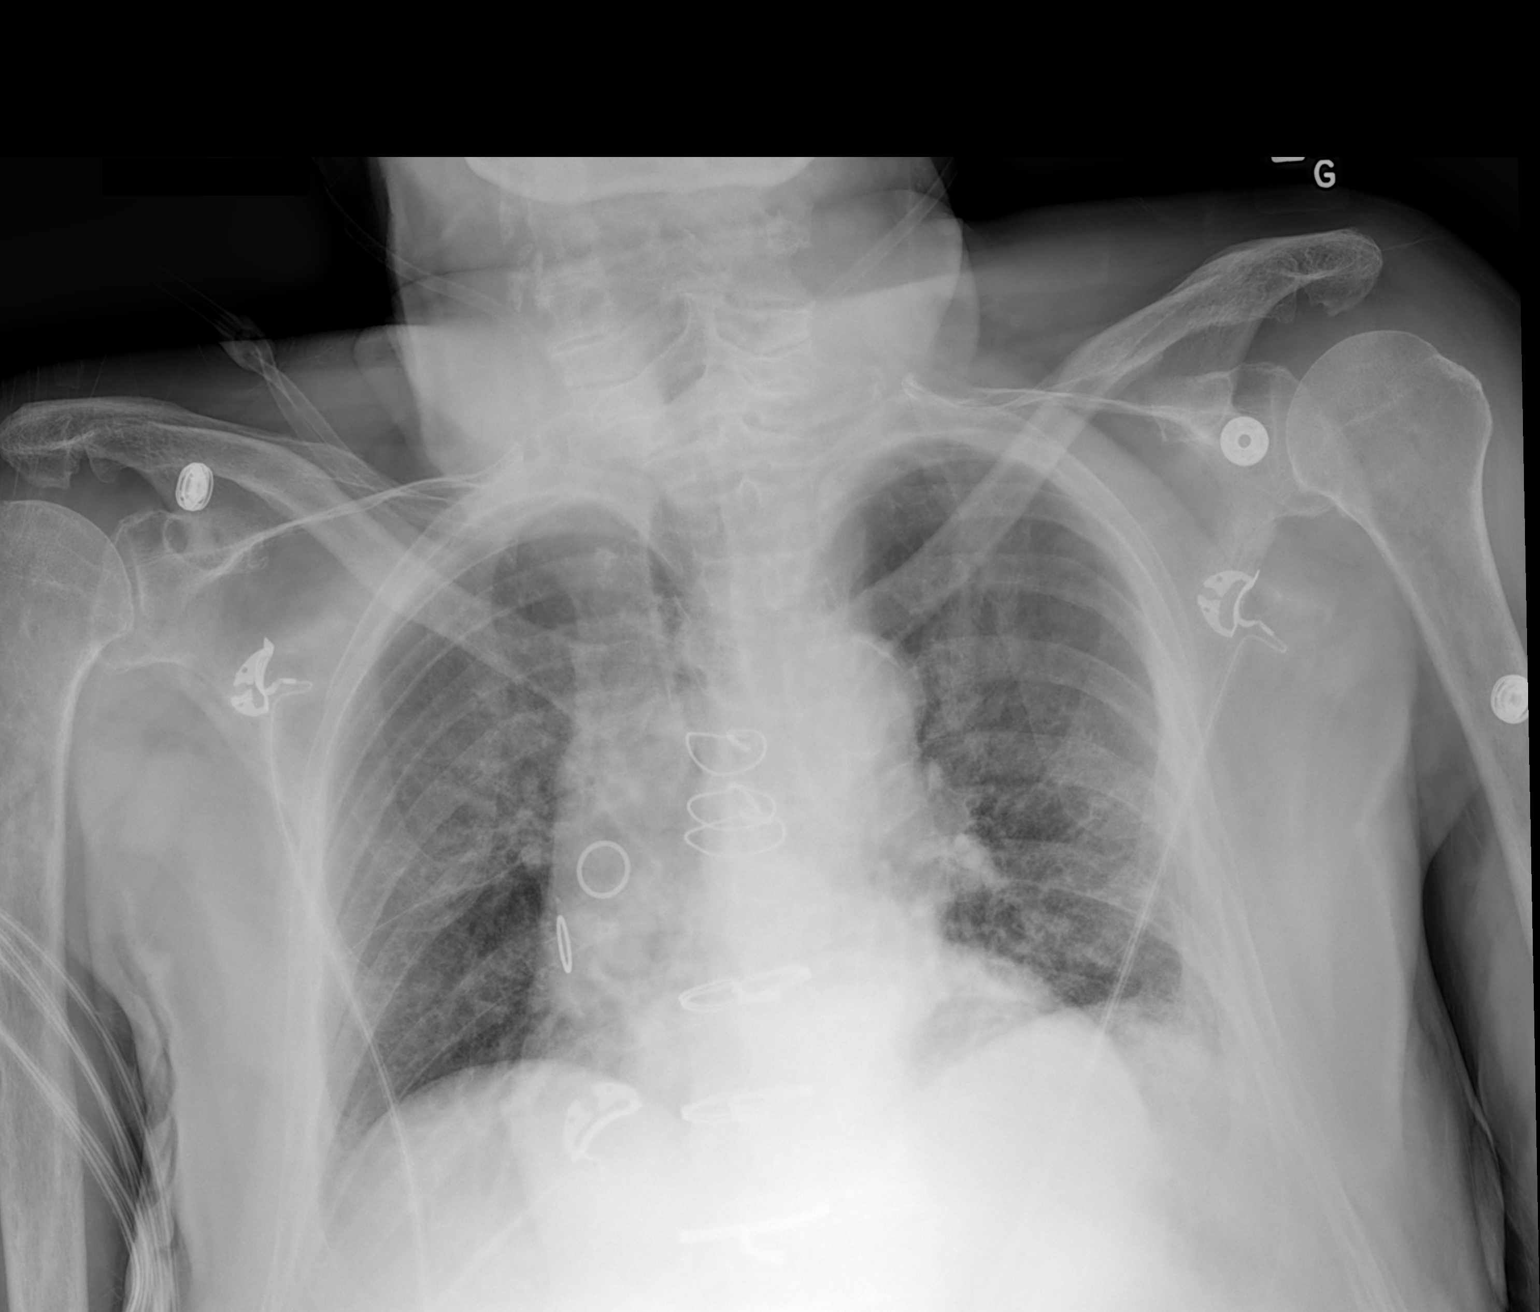

[1 of 1 positions shown; findings below may reference images not displayed]

FINDINGS: Low volume lungs with patchy interstitial coarsening bilaterally. No
increasing density in the right upper lung, the site of previously
suspected pneumonia. No effusion or pneumothorax. Chronic
cardiomegaly. Status post CABG. Posttraumatic appearance of the left
lateral ribs.
IMPRESSION: Probable chronic lung disease. Hypoaeration without definite edema
or consolidation.

## 2014-09-02 IMAGING — RF DG HIP OPERATIVE*R*
1 series · 4 of 4 positions shown · non-contrast
Comparison: None

FLUOROSCOPY TIME:  0.3 min

CLINICAL DATA: ORIF right hip fracture

EXAM:
DG OPERATIVE RIGHT HIP

[Series 1: run · 4 of 4 slices shown]
[im 1/4]
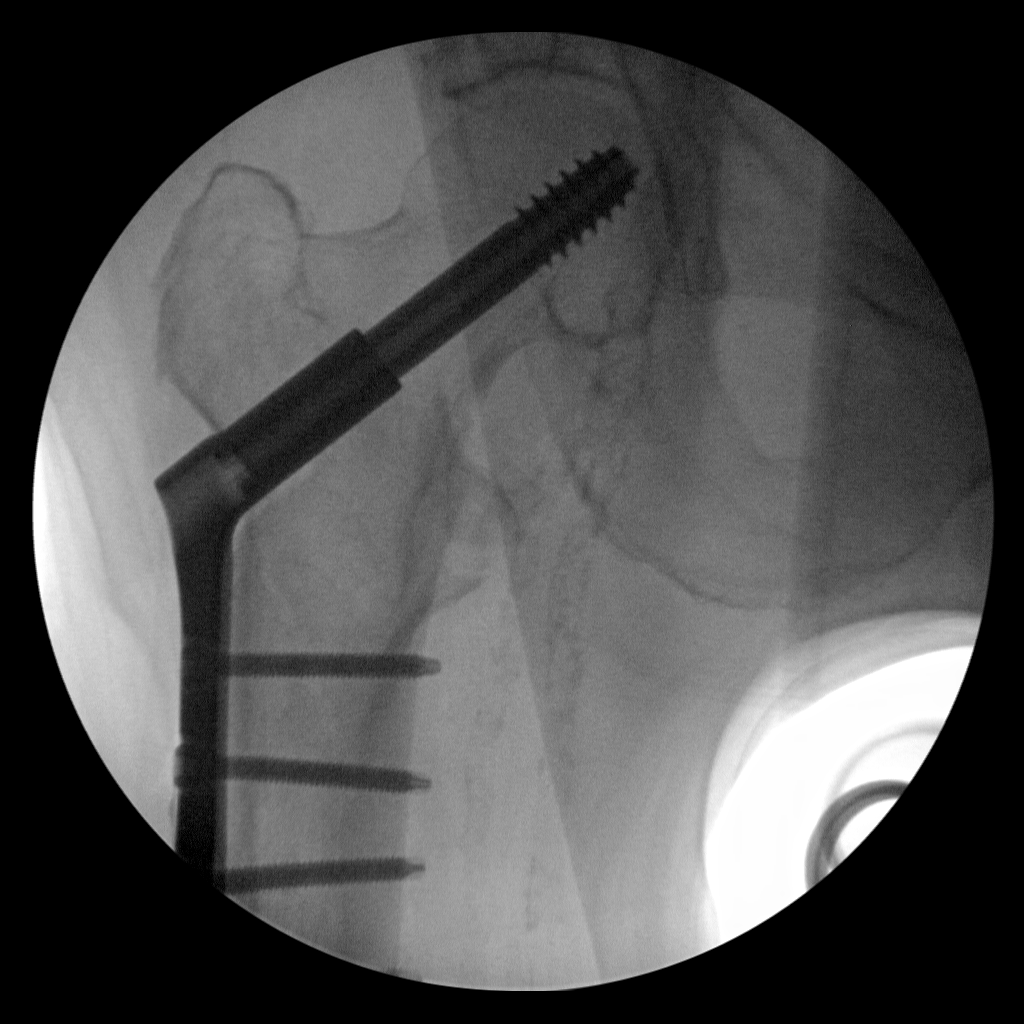
[im 2/4]
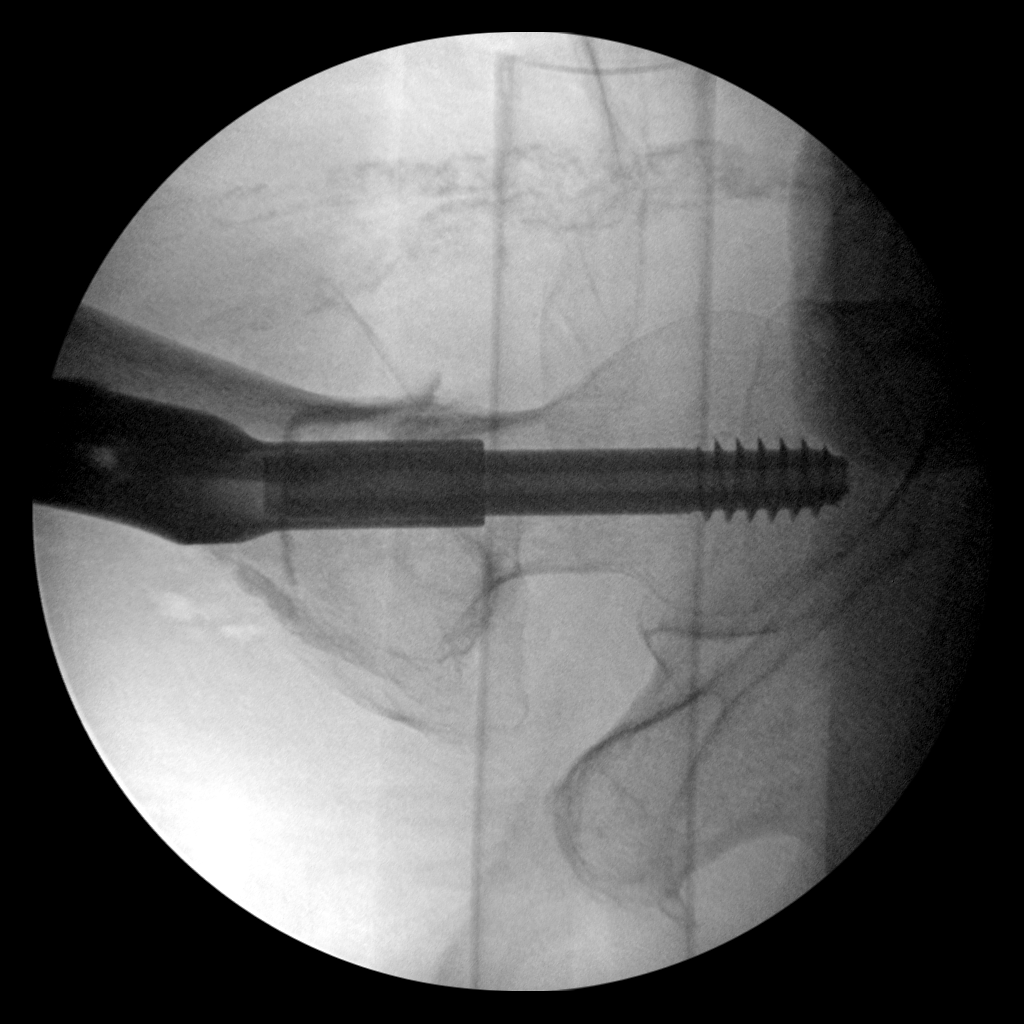
[im 3/4]
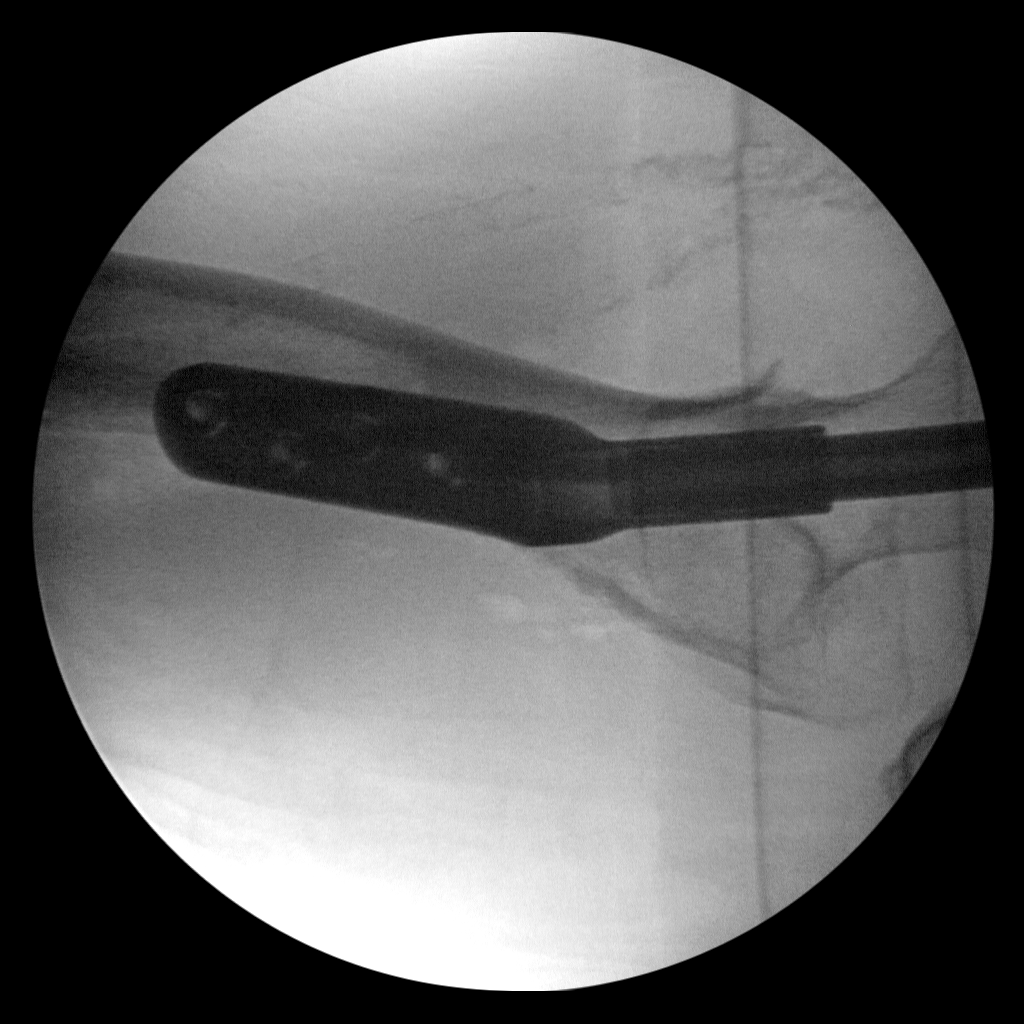
[im 4/4]
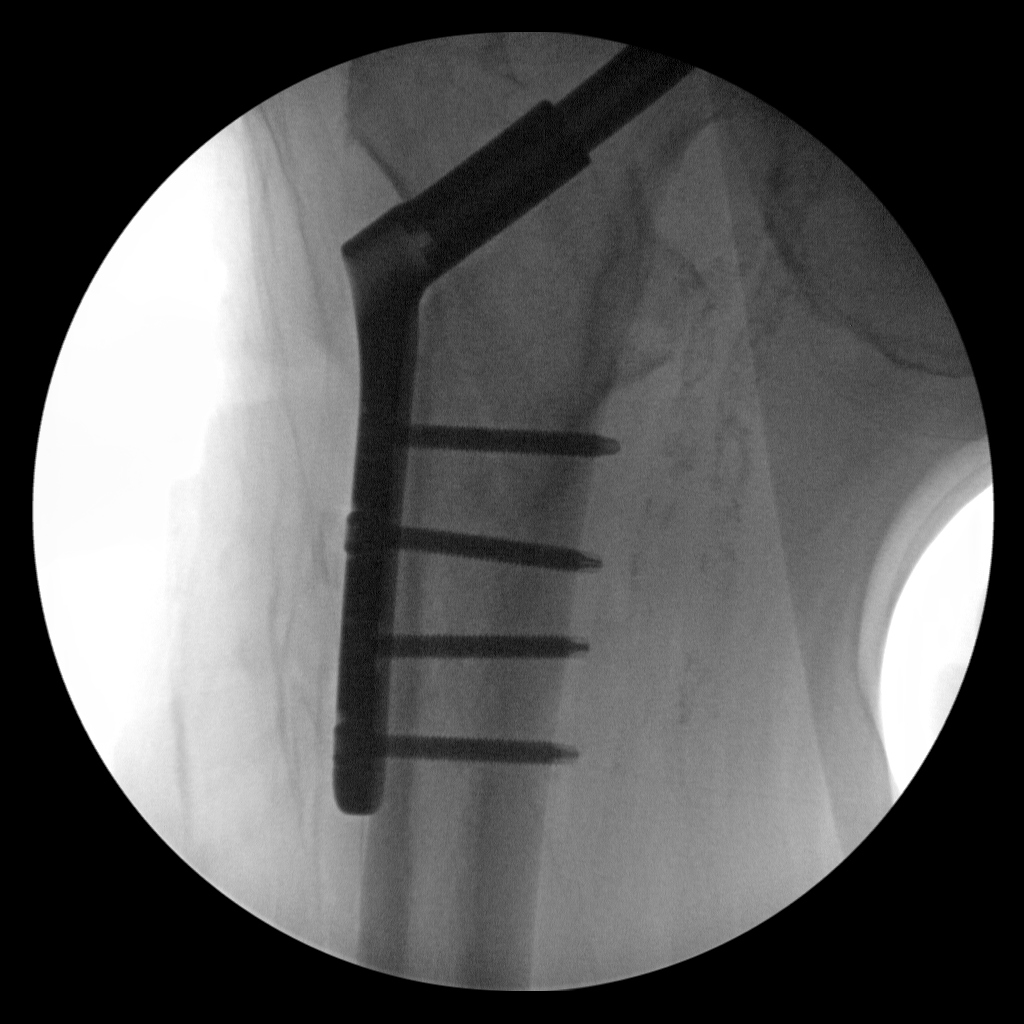

[4 of 4 positions shown; findings below may reference images not displayed]

FINDINGS: There has been interval placement of a proximal right femoral side
plate and interlocking dynamic compression screw transfixing a
mildly comminuted right intertrochanteric fracture which is in near
anatomic alignment. There is no dislocation. There is peripheral
vascular atherosclerotic disease.
IMPRESSION: ORIF right intertrochanteric fracture.
